# Patient Record
Sex: Female | Born: 1955 | Race: White | Hispanic: No | Smoking: Current every day smoker
Health system: Southern US, Community
[De-identification: ages and names within clinical notes are randomized; demographics above are authoritative.]

## PROBLEM LIST (undated history)

## (undated) DIAGNOSIS — Z87898 Personal history of other specified conditions: Secondary | ICD-10-CM

## (undated) DIAGNOSIS — R9389 Abnormal findings on diagnostic imaging of other specified body structures: Secondary | ICD-10-CM

## (undated) DIAGNOSIS — E119 Type 2 diabetes mellitus without complications: Secondary | ICD-10-CM

## (undated) DIAGNOSIS — I82409 Acute embolism and thrombosis of unspecified deep veins of unspecified lower extremity: Secondary | ICD-10-CM

## (undated) DIAGNOSIS — R7303 Prediabetes: Secondary | ICD-10-CM

## (undated) DIAGNOSIS — K449 Diaphragmatic hernia without obstruction or gangrene: Secondary | ICD-10-CM

## (undated) DIAGNOSIS — G4700 Insomnia, unspecified: Secondary | ICD-10-CM

## (undated) DIAGNOSIS — C801 Malignant (primary) neoplasm, unspecified: Secondary | ICD-10-CM

## (undated) DIAGNOSIS — E041 Nontoxic single thyroid nodule: Secondary | ICD-10-CM

## (undated) DIAGNOSIS — K118 Other diseases of salivary glands: Secondary | ICD-10-CM

## (undated) DIAGNOSIS — F410 Panic disorder [episodic paroxysmal anxiety] without agoraphobia: Secondary | ICD-10-CM

## (undated) DIAGNOSIS — D682 Hereditary deficiency of other clotting factors: Secondary | ICD-10-CM

## (undated) DIAGNOSIS — H269 Unspecified cataract: Secondary | ICD-10-CM

## (undated) DIAGNOSIS — K5792 Diverticulitis of intestine, part unspecified, without perforation or abscess without bleeding: Secondary | ICD-10-CM

## (undated) DIAGNOSIS — Z7901 Long term (current) use of anticoagulants: Secondary | ICD-10-CM

## (undated) DIAGNOSIS — K219 Gastro-esophageal reflux disease without esophagitis: Secondary | ICD-10-CM

## (undated) DIAGNOSIS — K589 Irritable bowel syndrome without diarrhea: Secondary | ICD-10-CM

## (undated) DIAGNOSIS — E0590 Thyrotoxicosis, unspecified without thyrotoxic crisis or storm: Secondary | ICD-10-CM

## (undated) HISTORY — PX: TUBAL LIGATION: SHX77

## (undated) HISTORY — DX: Diverticulitis of intestine, part unspecified, without perforation or abscess without bleeding: K57.92

## (undated) HISTORY — PX: CHOLECYSTECTOMY: SHX55

## (undated) HISTORY — DX: Unspecified cataract: H26.9

## (undated) HISTORY — DX: Long term (current) use of anticoagulants: Z79.01

## (undated) HISTORY — PX: TOE SURGERY: SHX1073

## (undated) HISTORY — DX: Diaphragmatic hernia without obstruction or gangrene: K44.9

## (undated) HISTORY — DX: Gastro-esophageal reflux disease without esophagitis: K21.9

## (undated) HISTORY — DX: Acute embolism and thrombosis of unspecified deep veins of unspecified lower extremity: I82.409

## (undated) HISTORY — DX: Personal history of other specified conditions: Z87.898

## (undated) HISTORY — DX: Irritable bowel syndrome, unspecified: K58.9

## (undated) HISTORY — DX: Insomnia, unspecified: G47.00

## (undated) HISTORY — DX: Panic disorder (episodic paroxysmal anxiety): F41.0

---
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
## 1998-03-29 ENCOUNTER — Emergency Department (HOSPITAL_COMMUNITY): Admission: EM | Admit: 1998-03-29 | Discharge: 1998-03-29 | Payer: Self-pay | Admitting: Emergency Medicine

## 1998-09-17 ENCOUNTER — Encounter: Payer: Self-pay | Admitting: Emergency Medicine

## 1998-09-17 ENCOUNTER — Emergency Department (HOSPITAL_COMMUNITY): Admission: EM | Admit: 1998-09-17 | Discharge: 1998-09-17 | Payer: Self-pay | Admitting: Emergency Medicine

## 2000-05-15 ENCOUNTER — Encounter (INDEPENDENT_AMBULATORY_CARE_PROVIDER_SITE_OTHER): Payer: Self-pay | Admitting: *Deleted

## 2000-05-15 ENCOUNTER — Ambulatory Visit (HOSPITAL_COMMUNITY): Admission: RE | Admit: 2000-05-15 | Discharge: 2000-05-15 | Payer: Self-pay | Admitting: Gastroenterology

## 2002-08-25 ENCOUNTER — Emergency Department (HOSPITAL_COMMUNITY): Admission: EM | Admit: 2002-08-25 | Discharge: 2002-08-25 | Payer: Self-pay | Admitting: Emergency Medicine

## 2002-08-25 ENCOUNTER — Encounter: Payer: Self-pay | Admitting: Emergency Medicine

## 2005-05-26 ENCOUNTER — Emergency Department (HOSPITAL_COMMUNITY): Admission: EM | Admit: 2005-05-26 | Discharge: 2005-05-26 | Payer: Self-pay | Admitting: Emergency Medicine

## 2005-11-02 ENCOUNTER — Emergency Department (HOSPITAL_COMMUNITY): Admission: EM | Admit: 2005-11-02 | Discharge: 2005-11-02 | Payer: Self-pay | Admitting: Emergency Medicine

## 2006-07-29 ENCOUNTER — Emergency Department (HOSPITAL_COMMUNITY): Admission: EM | Admit: 2006-07-29 | Discharge: 2006-07-29 | Payer: Self-pay | Admitting: Emergency Medicine

## 2008-03-16 ENCOUNTER — Emergency Department (HOSPITAL_COMMUNITY): Admission: EM | Admit: 2008-03-16 | Discharge: 2008-03-16 | Payer: Self-pay | Admitting: Emergency Medicine

## 2009-01-31 HISTORY — PX: US ECHOCARDIOGRAPHY: HXRAD669

## 2009-03-26 ENCOUNTER — Ambulatory Visit: Payer: Self-pay | Admitting: Family Medicine

## 2009-03-26 ENCOUNTER — Observation Stay (HOSPITAL_COMMUNITY): Admission: EM | Admit: 2009-03-26 | Discharge: 2009-03-28 | Payer: Self-pay | Admitting: Emergency Medicine

## 2009-03-29 ENCOUNTER — Telehealth: Payer: Self-pay | Admitting: *Deleted

## 2009-07-28 ENCOUNTER — Emergency Department (HOSPITAL_COMMUNITY): Admission: EM | Admit: 2009-07-28 | Discharge: 2009-07-28 | Payer: Self-pay | Admitting: Emergency Medicine

## 2009-07-29 ENCOUNTER — Inpatient Hospital Stay (HOSPITAL_COMMUNITY): Admission: EM | Admit: 2009-07-29 | Discharge: 2009-07-31 | Payer: Self-pay | Admitting: Emergency Medicine

## 2009-07-30 ENCOUNTER — Encounter (INDEPENDENT_AMBULATORY_CARE_PROVIDER_SITE_OTHER): Payer: Self-pay | Admitting: Surgery

## 2010-12-19 LAB — DIFFERENTIAL
Basophils Absolute: 0.1 10*3/uL (ref 0.0–0.1)
Basophils Absolute: 0.1 10*3/uL (ref 0.0–0.1)
Basophils Relative: 1 % (ref 0–1)
Eosinophils Absolute: 0.1 10*3/uL (ref 0.0–0.7)
Eosinophils Absolute: 0.1 10*3/uL (ref 0.0–0.7)
Eosinophils Relative: 0 % (ref 0–5)
Lymphocytes Relative: 13 % (ref 12–46)
Lymphs Abs: 1.8 10*3/uL (ref 0.7–4.0)
Lymphs Abs: 2.7 10*3/uL (ref 0.7–4.0)
Neutrophils Relative %: 77 % (ref 43–77)
Neutrophils Relative %: 84 % — ABNORMAL HIGH (ref 43–77)

## 2010-12-19 LAB — URINE MICROSCOPIC-ADD ON

## 2010-12-19 LAB — CBC
HCT: 43 % (ref 36.0–46.0)
HCT: 44.8 % (ref 36.0–46.0)
Hemoglobin: 12.7 g/dL (ref 12.0–15.0)
Hemoglobin: 14.7 g/dL (ref 12.0–15.0)
MCHC: 34.1 g/dL (ref 30.0–36.0)
MCHC: 34.3 g/dL (ref 30.0–36.0)
MCV: 88.3 fL (ref 78.0–100.0)
MCV: 89.2 fL (ref 78.0–100.0)
Platelets: 172 10*3/uL (ref 150–400)
Platelets: 205 10*3/uL (ref 150–400)
RBC: 4.87 MIL/uL (ref 3.87–5.11)
RDW: 14.5 % (ref 11.5–15.5)
RDW: 15 % (ref 11.5–15.5)
WBC: 17.5 10*3/uL — ABNORMAL HIGH (ref 4.0–10.5)

## 2010-12-19 LAB — HEPATIC FUNCTION PANEL
Albumin: 3.6 g/dL (ref 3.5–5.2)
Alkaline Phosphatase: 82 U/L (ref 39–117)
Indirect Bilirubin: 0.8 mg/dL (ref 0.3–0.9)
Total Protein: 7.5 g/dL (ref 6.0–8.3)

## 2010-12-19 LAB — URINALYSIS, ROUTINE W REFLEX MICROSCOPIC
Bilirubin Urine: NEGATIVE
Nitrite: NEGATIVE
Specific Gravity, Urine: 1.014 (ref 1.005–1.030)
Urobilinogen, UA: 1 mg/dL (ref 0.0–1.0)
pH: 6 (ref 5.0–8.0)

## 2010-12-19 LAB — COMPREHENSIVE METABOLIC PANEL
CO2: 23 mEq/L (ref 19–32)
Calcium: 9.1 mg/dL (ref 8.4–10.5)
Chloride: 102 mEq/L (ref 96–112)
Creatinine, Ser: 0.79 mg/dL (ref 0.4–1.2)
GFR calc non Af Amer: >60 mL/min (ref >60)
Glucose, Bld: 136 mg/dL — ABNORMAL HIGH (ref 70–99)
Total Bilirubin: 0.5 mg/dL (ref 0.3–1.2)

## 2010-12-19 LAB — BASIC METABOLIC PANEL
BUN: 9 mg/dL (ref 6–23)
Chloride: 96 mEq/L (ref 96–112)
Creatinine, Ser: 0.68 mg/dL (ref 0.4–1.2)
Glucose, Bld: 102 mg/dL — ABNORMAL HIGH (ref 70–99)
Potassium: 3.8 mEq/L (ref 3.5–5.1)

## 2010-12-19 LAB — POCT CARDIAC MARKERS
CKMB, poc: <1 ng/mL — ABNORMAL LOW (ref 1.0–8.0)
Troponin i, poc: <0.05 ng/mL (ref 0.00–0.09)

## 2010-12-19 LAB — LIPASE, BLOOD
Lipase: 12 U/L (ref 11–59)
Lipase: 15 U/L (ref 11–59)

## 2010-12-23 LAB — DIFFERENTIAL
Basophils Absolute: 0 10*3/uL (ref 0.0–0.1)
Basophils Relative: 0 % (ref 0–1)
Lymphocytes Relative: 17 % (ref 12–46)
Neutro Abs: 7 10*3/uL (ref 1.7–7.7)
Neutrophils Relative %: 74 % (ref 43–77)

## 2010-12-23 LAB — COMPREHENSIVE METABOLIC PANEL
ALT: 14 U/L (ref 0–35)
Albumin: 2.9 g/dL — ABNORMAL LOW (ref 3.5–5.2)
Alkaline Phosphatase: 57 U/L (ref 39–117)
Alkaline Phosphatase: 63 U/L (ref 39–117)
BUN: 4 mg/dL — ABNORMAL LOW (ref 6–23)
Chloride: 104 mEq/L (ref 96–112)
Chloride: 99 mEq/L (ref 96–112)
Creatinine, Ser: 0.68 mg/dL (ref 0.4–1.2)
GFR calc non Af Amer: >60 mL/min (ref >60)
Glucose, Bld: 104 mg/dL — ABNORMAL HIGH (ref 70–99)
Potassium: 3.3 mEq/L — ABNORMAL LOW (ref 3.5–5.1)
Potassium: 3.9 mEq/L (ref 3.5–5.1)
Sodium: 133 mEq/L — ABNORMAL LOW (ref 135–145)
Total Bilirubin: 0.6 mg/dL (ref 0.3–1.2)
Total Protein: 6.1 g/dL (ref 6.0–8.3)

## 2010-12-23 LAB — CBC
HCT: 35.5 % — ABNORMAL LOW (ref 36.0–46.0)
Hemoglobin: 12.6 g/dL (ref 12.0–15.0)
MCHC: 34.7 g/dL (ref 30.0–36.0)
MCV: 88.9 fL (ref 78.0–100.0)
Platelets: 128 10*3/uL — ABNORMAL LOW (ref 150–400)
Platelets: 133 10*3/uL — ABNORMAL LOW (ref 150–400)
RDW: 13.9 % (ref 11.5–15.5)
RDW: 14.9 % (ref 11.5–15.5)
WBC: 10.5 10*3/uL (ref 4.0–10.5)

## 2010-12-23 LAB — POCT I-STAT, CHEM 8
Calcium, Ion: 1.05 mmol/L — ABNORMAL LOW (ref 1.12–1.32)
Glucose, Bld: 164 mg/dL — ABNORMAL HIGH (ref 70–99)
HCT: 40 % (ref 36.0–46.0)
Hemoglobin: 13.6 g/dL (ref 12.0–15.0)
Potassium: 3.5 mEq/L (ref 3.5–5.1)

## 2010-12-23 LAB — HEMOGLOBIN A1C
Hgb A1c MFr Bld: 6.3 % — ABNORMAL HIGH (ref 4.6–6.1)
Mean Plasma Glucose: 134 mg/dL

## 2011-01-29 NOTE — Discharge Summary (Signed)
Rhonda Hudson, Rhonda Hudson NO.:  [id number]   MEDICAL RECORD NO.:  [id number]          PATIENT TYPE:  OBV   LOCATION:  5124                         FACILITY:  [facility]   PHYSICIAN:  Leighton Roach McDiarmid, M.D.DATE OF BIRTH:  [birth year]   DATE OF ADMISSION:  03/26/2009  DATE OF DISCHARGE:  03/28/2009                               DISCHARGE SUMMARY   PRIMARY CARE Laiyah Exline:  Alvia Grove, DO, at [facility].   DISCHARGE DIAGNOSES:  1. Questionable [facility] spotted fever (No rash)  2. Possible pneumonia based on Left Lower Lobe airspace consolidation      seen on Chest CT.  3. Gastroesophageal reflux disease.   DISCHARGE MEDICATIONS:  1. Doxycycline 100 mg by mouth twice daily for 5 more days, this is a      new prescription.  2. Nicotine patch 21 mg for 6 weeks, this is a new prescription.  3. Prilosec over-the-counter.   PROCEDURE:  Chest x-ray done on March 27, 2009, which showed a left lower  lobe mass versus focal infiltrate.  CT of the chest was recommended.  CT  of the chest was done on March 27, 2009, which showed a:  1. Left lower lobe airspace consolidation with surrounding      inflammatory ground glass consistent with pneumonia.  2. Scattered upper lobe airspace nodules, questionably infectious.  ER      follow up was recommended.  3. Left hilar lymph node maybe reactive.   LABORATORY DATA:  WBC 9.5, hemoglobin 12.3, and platelets 128.  Sodium  136, potassium 3.9, BUN 4, and creatinine 0.68.  Hemoglobin A1c is  pending as well as [facility] spotted fever is pending.   BRIEF HOSPITAL COURSE:  1. This is a 55 year old female with past medical history of GERD who      presented with a 3-day history of fever, flu-like symptoms, and      positive tick exposure.  The patient first noticed a small tick      embedded on her left shoulder on March 23, 2009, tick was removed by      her son.  By the next day, the patient began to  experience flu-like      symptoms with severe generalized malaise, malacia, and protracted      nausea, and vomiting.  She also had a frontal headache.  Fevers      were documented up to 103.3.  The patient presented to the ER and      was subsequently admitted to the floor.  She was started on IV      ibuprofen and doxycycline 100 mg p.o. b.i.d.  The patient reported      no sick contacts prior to her illness.  During her hospital course,      she was continued on doxycycline.  Chest x-ray as well as followup      CT of the chest was done.  The patient is feeling much better      today, fever has resolved, and she will be discharged on      doxycycline  100 mg b.i.d. x5 days.  Followup with her PCP.  2. Questionable pneumonia as seen on the CT scan.  Treatment as is      above.  We will continue to treat with doxycycline for pneumonia as      above. Recommend CT follow up within 3 months.  The patient has      been informed of this.  3. GERD.  The patient was continued during her hospital stay on      Protonix 40 mg daily.   DISCHARGE INSTRUCTIONS:  The patient was instructed if her fever  increases or if she gets any shortness of breath.  She feels sick or  weak or if headache returns, she is to return to the ER.   PENDING RESULTS:  Issues to be followed after discharge include her  hemoglobin A1c as well as [facility] spotted fever.  She also needs  to follow up in 2 months with a repeat CT scan with contrast of her  chest.   FOLLOWUP APPOINTMENTS:  An appointment with Dr. Alvia Grove at [facility] on April 07, 2009, at 3:30 p.m.   DISCHARGE CONDITION:  The patient was discharged to home in stable  medical condition.      Alvia Grove, DO  Electronically Signed      Leighton Roach McDiarmid, M.D.  Electronically Signed    BB/MEDQ  D:  03/28/2009  T:  03/29/2009  Job:  [id number]

## 2011-01-29 NOTE — H&P (Signed)
Rhonda Hudson, PIETSCH              ACCOUNT NO.:  [id number]   MEDICAL RECORD NO.:  [id number]          PATIENT TYPE:  OBV   LOCATION:  5124                         FACILITY:  [facility]   PHYSICIAN:  Leighton Roach McDiarmid, M.D.DATE OF BIRTH:  02/15/1956   DATE OF ADMISSION:  03/26/2009  DATE OF DISCHARGE:                              HISTORY & PHYSICAL   PRIMARY CARE PHYSICIAN:  Unassigned.   CHIEF COMPLAINT:  Fever.   HISTORY OF PRESENT ILLNESS:  This is a 55 year old female with a 3-day  history of fever, flu-like symptoms, and positive tick exposure.  The  patient first noticed a very small tick imbedded on her left shoulder on  March 23, 2009.  The tick was removed carefully by her and her son, per  the patient.  The next day, the patient began to experience flu-like  symptoms with severe generalized malaise, myalgia, as well as having  protracted nausea and vomiting.  The patient then began to experience a  severe frontal headache associated with the flu-like symptoms.  Of note,  the patient does have a prior history of migraines; however, the patient  does report that the headache she has had over the past few days is not  similar to her typical migraine symptomatology.  The patient states that  approximately a day ago she began to experience subjective fevers that  were persistent for greater than 24 hours.  The patient then went to  urgent care where the patient's temperature was at 102.9 and then 103.3  after receiving Tylenol.  The patient was then subsequently sent to the  [facility] Emergency Department on ibuprofen as well as  doxycycline for further evaluation at [facility].  The patient  does state that the ibuprofen and doxycycline did help make her feel a  little bit better, per the patient.  The patient does not report any  sick contacts with similar symptomatology, no diarrhea during the acute  phase of her sickness over the past 3 days.  In particular, no  rashes  around the area of the tick insertion on the left shoulder.  She has had  decreased appetite as well as leg cramps bilaterally.   PAST MEDICAL HISTORY:  1. Hiatal hernia.  2. Gastroesophageal reflux disease.  3. Migraine headaches.   PAST SURGICAL HISTORY:  Bilateral tubal ligation.   MEDICATIONS:  Prilosec.   ALLERGIES:  NO KNOWN DRUG ALLERGIES.   SOCIAL HISTORY:  The patient works out 2 hours a day and has lost 40  pounds over the last 4 months.  She does smoke 1 pack per day.  She has  smoked 1 pack per day for 30 years.  She denies alcohol or any other  illegal drug use.  She has 3 sons with whom she lives with too.   FAMILY HISTORY:  She has a 51 year old son with epilepsy and a 30-year-  old son with Tourette.   PHYSICAL EXAMINATION:  VITALS:  Temperature 103.3, pulse 80, respiratory  rate 18, blood pressure 97/59, pulse ox satting greater than 100% on  room air.  GENERAL:  Patient is alert and oriented x3 in no acute distress.  HEENT:  Tacky mucous membranes.  NECK:  Full range of motion with meningeal signs such as nuchal rigidity  noted.  Neck shows no cervical lymphadenopathy.  MOUTH:  Oropharynx is pink and moist.  CARDIOVASCULAR:  Regular rate and rhythm.  No rubs, gallops, or murmurs.  RESPIRATORY:  Clear to auscultation bilaterally without wheezes, rales,  or rhonchi.  ABDOMEN:  Soft, nontender, nondistended.  Positive bowel sounds.  EXTREMITIES:  Left shoulder with visible scab where tick was embedded.  No joint tenderness or effusion noted diffusely.  SKIN:  No rashes, brisk capillary refill.   LABORATORY DATA:  White count 10.5, hemoglobin 12.6, hematocrit 36.9,  platelets 133.  Sodium 133, potassium 3.3, chloride 99, bicarb 24, BUN  7.0, creatinine 0.87, blood sugar 140.   ASSESSMENT AND PLAN:  This is a 55 year old female with fever and flu-  like symptoms as well as positive tick exposure concerning for [facility] spotted fever.  1.  Fever.  Given the patient's clinical presentation and history, the      patient has a very good story for [facility] spotted fever.      The patient has positive flu-like symptoms in the middle of the      summer without any other identifiable cause for the fever, no      positive sick contacts, but certainly positive exposure and      imbedding of the tick prior to the onset of symptoms.  We will      treat with doxycycline 100 mg p.o. b.i.d.  We will also use Tylenol      and  ibuprofen p.r.n. for fever as well as Phenergan p.r.n. for      nausea and vomiting.  We will also obtain a [facility] spotted      fever IFA antibody to look at any possible acute response to [facility] Fever.  2. Headache.  At this point, there are not any signs concerning for      meningitis or encephalitis.  The patient is mentating well.  There      is no nuchal rigidity or headache presenting as worse headache of      the patient's life per se.  We will continue to monitor and treat      with Tylenol and Motrin.  3. Gastroesophageal reflux disease.  The patient is on Protonix 40 mg      p.o. daily.  4. FEN/GI:  The patient is dehydrated given the protracted nausea over      the weekend.  We will give her 1 liter of normal saline bolus and      then D5 half normal at 100 mL per hour.  5. Prophylaxis.  The patient is on Protonix as well as early      ambulation.  6. Discharge pending clinical improvement.      Doree Albee, MD  Electronically Signed      Leighton Roach McDiarmid, M.D.  Electronically Signed    SN/MEDQ  D:  03/27/2009  T:  03/27/2009  Job:  [id number]

## 2011-02-01 NOTE — Procedures (Signed)
[name]. [facility]  Patient:    Rhonda Hudson, [name]                     MRN: [id number] Proc. Date: 05/15/00 Adm. Date:  [id number] Attending:  Rich Brave CC:         Stacie Acres. Cliffton Asters, M.D.   Procedure Report  PROCEDURE:  Colonoscopy with biopsies.  INDICATIONS:  Diarrhea and intermittent rectal bleeding in a 55 year old female with chronic abdominal pain.  FINDINGS:  Normal examination except for moderate sigmoid diverticulosis.  PROCEDURE:  The nature, purpose, and risks of the procedure had been discussed with the patient, who provided written consent.  This procedure was performed immediately following her upper endoscopy.  Total sedation for the two procedures was fentanyl 150 mcg and Versed 10 mg without arrhythmias or desaturation.  The Olympus adult video colonoscope was advanced without too much difficulty to the cecum, applying external abdominal compression to facilitate passage.  Pullback was then performed.  The cecum was identified by visualization of the appendiceal orifice.  The terminal ileum was not entered.  The quality of the prep was excellent and it is felt that all areas were well seen.  The only abnormality on this examination was moderate sigmoid diverticulosis. I did not see diverticula elsewhere.  No polyps, cancer, colitis or vascular malformations were observed and retroflexion of the rectum was unremarkable. Random mucosal biopsies were obtained from the left colon and rectum.  The patient tolerated the procedure well and there were no apparent complications.  IMPRESSION:  Sigmoid diverticulosis, otherwise normal examination.   No source of diarrhea identified.  Her rectal bleeding is presumed to be of hemorrhoidal origin or perhaps anal fissures, given the absent of alternative explanations seen on this examination.  PLAN:  Await pathology on biopsies.  Office follow-up in the near future. DD:   05/15/00 TD:  05/16/00 Job: [id number]

## 2011-02-01 NOTE — Procedures (Signed)
[place]  Patient:    Rhonda Hudson, Rhonda Hudson                     MRN: [id number] Proc. Date: 05/15/00 Adm. Date:  [id number] Attending:  Rich Brave CC:         Stacie Acres. Cliffton Asters, M.D.   Procedure Report  PROCEDURE:  Upper endoscopy with biopsies.  INDICATIONS:  A 55 year old female with recurrent vomiting episodes of unclear cause, also anorexia and epigastric pain.  FINDINGS:  Normal upper endoscopy.  PROCEDURE:  The nature, purpose, and risks of the procedure have been discussed with the patient, who provided written consent.  Sedation was fentanyl 50 mcg and Versed 5 mg IV without arrhythmias or desaturation.  The Olympus video endoscope was passed under direct vision.  The vocal cords looked normal.  The esophagus was easily entered and had normal mucosa, without evidence of reflux, esophagitis, Barretts esophagus, varices, infection or neoplasia.  There was perhaps a 1 cm minimal hiatal hernia present, but no ring or stricture was appreciated.  The stomach contained no significant residual, just a small clear residual, which was suctioned up, and no gastritis, erosions, ulcers, polyps or masses were noted, including a retroflexed view of the proximal stomach.  The pylorus, duodenal bulb and second duodenum were similarly normal in appearance.  Duodenal and antral biopsies were obtained prior to removal of the scope.  The patient tolerated the procedure well and there were no apparent complications.  IMPRESSION:  Normal upper endoscopy, without source of abdominal pain, nausea, vomiting or anorexia evident.  PLAN:  Await pathology on biopsies and to proceed to colonoscopic evaluation. DD:  05/15/00 TD:  05/16/00 Job: [id number]

## 2011-06-13 LAB — DIFFERENTIAL
Eosinophils Absolute: 0.1
Lymphocytes Relative: 24
Lymphs Abs: 2.8
Neutro Abs: 7.8 — ABNORMAL HIGH
Neutrophils Relative %: 68

## 2011-06-13 LAB — POCT I-STAT, CHEM 8
BUN: 9
Potassium: 3.5
Sodium: 140
TCO2: 25

## 2011-06-13 LAB — POCT CARDIAC MARKERS
Myoglobin, poc: 49.2
Operator id: 272551

## 2011-06-13 LAB — CBC
MCV: 88.3
Platelets: 230
WBC: 11.5 — ABNORMAL HIGH

## 2011-06-13 LAB — D-DIMER, QUANTITATIVE: D-Dimer, Quant: 0.92 — ABNORMAL HIGH

## 2011-12-20 ENCOUNTER — Encounter: Payer: Self-pay | Admitting: *Deleted

## 2012-11-24 ENCOUNTER — Emergency Department (HOSPITAL_COMMUNITY)
Admission: EM | Admit: 2012-11-24 | Discharge: 2012-11-24 | Disposition: A | Discharge status: Home / Self Care | Payer: Self-pay | Attending: Emergency Medicine | Admitting: Emergency Medicine

## 2012-11-24 ENCOUNTER — Encounter (HOSPITAL_COMMUNITY): Payer: Self-pay | Admitting: Nurse Practitioner

## 2012-11-24 ENCOUNTER — Emergency Department (HOSPITAL_COMMUNITY): Payer: Self-pay

## 2012-11-24 DIAGNOSIS — Z3202 Encounter for pregnancy test, result negative: Secondary | ICD-10-CM | POA: Insufficient documentation

## 2012-11-24 DIAGNOSIS — R630 Anorexia: Secondary | ICD-10-CM | POA: Insufficient documentation

## 2012-11-24 DIAGNOSIS — Z8659 Personal history of other mental and behavioral disorders: Secondary | ICD-10-CM | POA: Insufficient documentation

## 2012-11-24 DIAGNOSIS — R110 Nausea: Secondary | ICD-10-CM | POA: Insufficient documentation

## 2012-11-24 DIAGNOSIS — Z8719 Personal history of other diseases of the digestive system: Secondary | ICD-10-CM | POA: Insufficient documentation

## 2012-11-24 DIAGNOSIS — Z9851 Tubal ligation status: Secondary | ICD-10-CM | POA: Insufficient documentation

## 2012-11-24 DIAGNOSIS — Z8669 Personal history of other diseases of the nervous system and sense organs: Secondary | ICD-10-CM | POA: Insufficient documentation

## 2012-11-24 DIAGNOSIS — K219 Gastro-esophageal reflux disease without esophagitis: Secondary | ICD-10-CM | POA: Insufficient documentation

## 2012-11-24 DIAGNOSIS — F17200 Nicotine dependence, unspecified, uncomplicated: Secondary | ICD-10-CM | POA: Insufficient documentation

## 2012-11-24 DIAGNOSIS — R141 Gas pain: Secondary | ICD-10-CM | POA: Insufficient documentation

## 2012-11-24 DIAGNOSIS — A599 Trichomoniasis, unspecified: Secondary | ICD-10-CM | POA: Insufficient documentation

## 2012-11-24 DIAGNOSIS — R142 Eructation: Secondary | ICD-10-CM | POA: Insufficient documentation

## 2012-11-24 LAB — POCT I-STAT TROPONIN I: Troponin i, poc: 0 ng/mL (ref 0.00–0.08)

## 2012-11-24 LAB — LIPASE, BLOOD: Lipase: 18 U/L (ref 11–59)

## 2012-11-24 LAB — URINALYSIS, MICROSCOPIC ONLY
Glucose, UA: NEGATIVE mg/dL
Hgb urine dipstick: NEGATIVE
Ketones, ur: NEGATIVE mg/dL
Protein, ur: NEGATIVE mg/dL

## 2012-11-24 LAB — CBC WITH DIFFERENTIAL/PLATELET
HCT: 42.8 % (ref 36.0–46.0)
Hemoglobin: 15.3 g/dL — ABNORMAL HIGH (ref 12.0–15.0)
Lymphocytes Relative: 25 % (ref 12–46)
Lymphs Abs: 2.5 10*3/uL (ref 0.7–4.0)
Monocytes Absolute: 0.7 10*3/uL (ref 0.1–1.0)
Monocytes Relative: 7 % (ref 3–12)
Neutro Abs: 6.7 10*3/uL (ref 1.7–7.7)
WBC: 9.9 10*3/uL (ref 4.0–10.5)

## 2012-11-24 LAB — COMPREHENSIVE METABOLIC PANEL
AST: 16 U/L (ref 0–37)
BUN: 6 mg/dL (ref 6–23)
CO2: 20 mEq/L (ref 19–32)
Chloride: 100 mEq/L (ref 96–112)
Creatinine, Ser: 0.71 mg/dL (ref 0.50–1.10)
GFR calc non Af Amer: >90 mL/min (ref >90)
Total Bilirubin: 0.4 mg/dL (ref 0.3–1.2)

## 2012-11-24 MED ORDER — METRONIDAZOLE 500 MG PO TABS: 500.0000 mg | ORAL_TABLET | Freq: Two times a day (BID) | ORAL | Status: DC

## 2012-11-24 MED ORDER — HYDROCODONE-ACETAMINOPHEN 5-325 MG PO TABS: 1.0000 | ORAL_TABLET | Freq: Four times a day (QID) | ORAL | Status: DC | PRN

## 2012-11-24 NOTE — ED Provider Notes (Signed)
History     CSN: [id number]  Arrival date & time 11/24/12  1303   First MD Initiated Contact with Patient 11/24/12 1708      Chief Complaint  Patient presents with  . Abdominal Pain    Patient is a 57 y.o. female presenting with abdominal pain. The history is provided by the patient. No language interpreter was used.  Abdominal Pain Pain location:  Epigastric Pain quality: sharp   Pain radiates to:  Does not radiate Pain severity:  No pain Onset quality:  Gradual Duration:  12 hours Timing:  Constant Progression:  Waxing and waning Chronicity:  New Context: previous surgery (cholecystectomy)   Context: not suspicious food intake and not trauma   Relieved by:  Nothing Worsened by:  Position changes Ineffective treatments:  None tried Associated symptoms: anorexia, belching and nausea   Associated symptoms: no chest pain, no chills, no constipation, no cough, no diarrhea, no dysuria, no fever, no shortness of breath and no vomiting      Past Medical History  Diagnosis Date  . Insomnia   . Multiple sclerosis   . Hiatal hernia with gastroesophageal reflux     disease  . Diverticulitis   . Irritable bowel syndrome   . Panic attacks     Hx of    Past Surgical History  Procedure Laterality Date  . Tubal ligation    . US echocardiography  01-31-2009    EF 60%    History reviewed. No pertinent family history.  History  Substance Use Topics  . Smoking status: Current Every Day Smoker -- 1.00 packs/day  . Smokeless tobacco: Not on file  . Alcohol Use: No    OB History   Grav Para Term Preterm Abortions TAB SAB Ect Mult Living                  Review of Systems  Constitutional: Negative for fever and chills.  HENT: Negative for congestion, rhinorrhea, neck pain and neck stiffness.   Respiratory: Negative for cough and shortness of breath.   Cardiovascular: Negative for chest pain.  Gastrointestinal: Positive for nausea, abdominal pain and anorexia. Negative  for vomiting, diarrhea, constipation and abdominal distention.  Endocrine: Negative for polyuria.  Genitourinary: Negative for dysuria.  Skin: Negative for rash.  Neurological: Negative for headaches.  Psychiatric/Behavioral: Negative.   All other systems reviewed and are negative.    Allergies  Review of patient's allergies indicates no known allergies.  Home Medications   Current Outpatient Rx  Name  Route  Sig  Dispense  Refill  . ibuprofen (ADVIL,MOTRIN) 200 MG tablet   Oral   Take 200 mg by mouth 4 (four) times daily as needed for pain. For pain           BP 130/67  Pulse 96  Temp(Src) 98.3 F (36.8 C) (Oral)  Resp 15  SpO2 95%  Physical Exam  Nursing note and vitals reviewed. Constitutional: She is oriented to person, place, and time. She appears well-developed and well-nourished. No distress.  HENT:  Head: Normocephalic and atraumatic.  Right Ear: External ear normal.  Left Ear: External ear normal.  Nose: Nose normal.  Mouth/Throat: Oropharynx is clear and moist. No oropharyngeal exudate.  Eyes: EOM are normal. Pupils are equal, round, and reactive to light.  Neck: Normal range of motion. Neck supple. No tracheal deviation present.  Cardiovascular: Normal rate.   Pulmonary/Chest: Effort normal and breath sounds normal. No stridor. No respiratory distress. She has no wheezes.  She has no rales.  Abdominal: Soft. She exhibits no distension. There is no tenderness. There is no rebound.  Musculoskeletal: Normal range of motion.  Neurological: She is alert and oriented to person, place, and time.  Skin: Skin is warm and dry. She is not diaphoretic.    ED Course  Procedures (including critical care time)  Labs Reviewed  CBC WITH DIFFERENTIAL - Abnormal; Notable for the following:    Hemoglobin 15.3 (*)    All other components within normal limits  COMPREHENSIVE METABOLIC PANEL - Abnormal; Notable for the following:    Sodium 133 (*)    Glucose, Bld 120 (*)     All other components within normal limits  LIPASE, BLOOD  URINALYSIS, MICROSCOPIC ONLY  POCT I-STAT TROPONIN I  POCT PREGNANCY, URINE   Dg Abd Acute W/chest  11/24/2012  *RADIOLOGY REPORT*  Clinical Data: Abdominal pain  ACUTE ABDOMEN SERIES (ABDOMEN 2 VIEW & CHEST 1 VIEW)  Comparison: Chest radiograph 03/26/2009  Findings: Minimal scarring or atelectasis noted at the costophrenic angles bilaterally.  Lungs are otherwise clear.  Heart size normal. No pleural effusion.  No free air beneath the diaphragms.  Cholecystectomy clips noted.  Bowel gas pattern is normal.  No abnormal calcific opacity.  No acute osseous finding.  IMPRESSION: Minimal bibasilar atelectasis or scarring.  Normal bowel gas pattern.   Original Report Authenticated By: Christiana Pellant, M.D.      1. Trichomoniasis   2. GERD (gastroesophageal reflux disease)       MDM   57 year old female who presents to the emergency department for concern of epigastric abdominal pain that started early this morning. It awoke her from sleep. Has been moderate in severity waxing and waning since then. Different than irritable bowel syndrome.  Labs unremarkable with exception of trichomonas in urine.  Patient reassesed and plan to do pelvic examination to evaluate for possible PID or pelvic cause of pain.  Patient politely declined and understood risks.  Acute abdominal series without any free air or air/fluid levels.  Patient reevaluated and feeling better.  Patient safe for discharge.  Doubt appendicitis.  Likely GERD.  Will treat pain and trichomoniasis with flagyl.  Patient safe for discharge.  Patient to return at any time if she changes her mind about pelvic.  Patient to establish PCP and GYN for further evaluation.  Patient understanding plan.  Patient discharged.       Arloa Koh, MD 11/24/12 2207

## 2012-11-24 NOTE — ED Notes (Signed)
Pt c/o severe upper abd pain for past 2 days. History of ulcer, acid reflux, diverticulosis. Pt reports she is supposed to take thyroid medicine but has not been taking it as instructed. Pt reports severe nausea.

## 2012-11-24 NOTE — ED Notes (Signed)
Per Dr. Piedad Climes, pt refused pelvic exam.

## 2012-11-25 LAB — URINE CULTURE: Colony Count: >=100000

## 2012-11-25 NOTE — ED Provider Notes (Signed)
I  reviewed the resident's note and I agree with the findings and plan.     Nelia Shi, MD 11/25/12 1113

## 2013-08-17 ENCOUNTER — Encounter: Payer: Self-pay | Admitting: Cardiology

## 2015-02-06 ENCOUNTER — Other Ambulatory Visit: Payer: Self-pay | Admitting: Dermatology

## 2015-03-03 ENCOUNTER — Other Ambulatory Visit (HOSPITAL_COMMUNITY)
Admission: RE | Admit: 2015-03-03 | Discharge: 2015-03-03 | Disposition: A | Discharge status: Home / Self Care | Payer: BLUE CROSS/BLUE SHIELD | Source: Ambulatory Visit | Attending: Obstetrics & Gynecology | Admitting: Obstetrics & Gynecology

## 2015-03-03 ENCOUNTER — Other Ambulatory Visit: Payer: Self-pay | Admitting: Obstetrics & Gynecology

## 2015-03-03 DIAGNOSIS — Z1151 Encounter for screening for human papillomavirus (HPV): Secondary | ICD-10-CM | POA: Insufficient documentation

## 2015-03-03 DIAGNOSIS — Z01419 Encounter for gynecological examination (general) (routine) without abnormal findings: Secondary | ICD-10-CM | POA: Diagnosis present

## 2015-03-06 LAB — CYTOLOGY - PAP

## 2015-07-14 ENCOUNTER — Ambulatory Visit (INDEPENDENT_AMBULATORY_CARE_PROVIDER_SITE_OTHER): Payer: BLUE CROSS/BLUE SHIELD | Admitting: Emergency Medicine

## 2015-07-14 VITALS — BP 128/80 | HR 73 | Temp 97.8°F | Resp 16 | Ht 68.0 in | Wt 211.0 lb

## 2015-07-14 DIAGNOSIS — M545 Low back pain: Secondary | ICD-10-CM

## 2015-07-14 LAB — POCT URINALYSIS DIP (MANUAL ENTRY)
BILIRUBIN UA: NEGATIVE
Glucose, UA: NEGATIVE
Ketones, POC UA: NEGATIVE
Leukocytes, UA: NEGATIVE
NITRITE UA: NEGATIVE
PH UA: 5
Protein Ur, POC: NEGATIVE
RBC UA: NEGATIVE
Spec Grav, UA: 1.015
Urobilinogen, UA: 0.2

## 2015-07-14 LAB — POC MICROSCOPIC URINALYSIS (UMFC)

## 2015-07-14 MED ORDER — CYCLOBENZAPRINE HCL 10 MG PO TABS: 10.0000 mg | ORAL_TABLET | Freq: Three times a day (TID) | ORAL | Status: DC | PRN

## 2015-07-14 MED ORDER — NAPROXEN SODIUM 550 MG PO TABS: 550.0000 mg | ORAL_TABLET | Freq: Two times a day (BID) | ORAL | Status: DC

## 2015-07-14 NOTE — Patient Instructions (Signed)

## 2015-07-14 NOTE — Progress Notes (Signed)
Subjective:  Patient ID: Rhonda Hudson, female    DOB: 05/23/1956  Age: 59 y.o. MRN: [id number]  CC: Back Pain   HPI Rhonda Hudson presents  with low back pain. She's concerned because she has a history of a cyst she has no dysuria urgency or frequency. She holds her urine for 8 hours at work until she returned home again B should like using public facilities. She has no fever or chills. No nausea or vomiting. No stool change. No vaginal discharge or bleeding. She has no history of injury or overuse. He has no radiation of pain. Pain is located in the right lower back  History Rhonda Hudson has a past medical history of Insomnia; Multiple sclerosis ([place]); Hiatal hernia with gastroesophageal reflux; Diverticulitis; Irritable bowel syndrome; and Panic attacks.   She has past surgical history that includes Tubal ligation; US ECHOCARDIOGRAPHY (01-31-2009); and Cholecystectomy.   Her  family history is not on file.  She   reports that she has been smoking.  She does not have any smokeless tobacco history on file. She reports that she does not drink alcohol or use illicit drugs.  Outpatient Prescriptions Prior to Visit  Medication Sig Dispense Refill  . HYDROcodone-acetaminophen (NORCO) 5-325 MG per tablet Take 1-2 tablets by mouth every 6 (six) hours as needed for pain. (Patient not taking: Reported on 07/14/2015) 15 tablet 0  . metroNIDAZOLE (FLAGYL) 500 MG tablet Take 1 tablet (500 mg total) by mouth 2 (two) times daily. (Patient not taking: Reported on 07/14/2015) 14 tablet 0  . ibuprofen (ADVIL,MOTRIN) 200 MG tablet Take 200 mg by mouth 4 (four) times daily as needed for pain. For pain     No facility-administered medications prior to visit.    Social History   Social History  . Marital Status: Divorced    Spouse Name: N/A  . Number of Children: N/A  . Years of Education: N/A   Social History Main Topics  . Smoking status: Current Every Day Smoker -- 1.00 packs/day  .  Smokeless tobacco: None  . Alcohol Use: No  . Drug Use: No  . Sexual Activity: Not Asked   Other Topics Concern  . None   Social History Narrative     Review of Systems  Constitutional: Negative for fever, chills and appetite change.  HENT: Negative for congestion, ear pain, postnasal drip, sinus pressure and sore throat.   Eyes: Negative for pain and redness.  Respiratory: Negative for cough, shortness of breath and wheezing.   Cardiovascular: Negative for leg swelling.  Gastrointestinal: Negative for nausea, vomiting, abdominal pain, diarrhea, constipation and blood in stool.  Endocrine: Negative for polyuria.  Genitourinary: Negative for dysuria, urgency, frequency and flank pain.  Musculoskeletal: Positive for back pain. Negative for gait problem.  Skin: Negative for rash.  Neurological: Negative for weakness and headaches.  Psychiatric/Behavioral: Negative for confusion and decreased concentration. The patient is not nervous/anxious.     Objective:  BP 128/80 mmHg  Pulse 73  Temp(Src) 97.8 F (36.6 C) (Oral)  Resp 16  Ht 5\' 8"  (1.727 m)  Wt 211 lb (95.709 kg)  BMI 32.09 kg/m2  SpO2 98%  Physical Exam  Constitutional: She is oriented to person, place, and time. She appears well-developed and well-nourished. No distress.  HENT:  Head: Normocephalic and atraumatic.  Right Ear: External ear normal.  Left Ear: External ear normal.  Nose: Nose normal.  Eyes: Conjunctivae and EOM are normal. Pupils are equal, round, and reactive to light.  No scleral icterus.  Neck: Normal range of motion. Neck supple. No tracheal deviation present.  Cardiovascular: Normal rate, regular rhythm and normal heart sounds.   Pulmonary/Chest: Effort normal. No respiratory distress. She has no wheezes. She has no rales.  Abdominal: She exhibits no mass. There is no tenderness. There is no rebound and no guarding.  Musculoskeletal: She exhibits no edema.  Lymphadenopathy:    She has no  cervical adenopathy.  Neurological: She is alert and oriented to person, place, and time. Coordination normal.  Skin: Skin is warm and dry. No rash noted.  Psychiatric: She has a normal mood and affect. Her behavior is normal.   She is point tender in the right lumbar region.   Assessment & Plan:   Rhonda Hudson was seen today for back pain.  Diagnoses and all orders for this visit:  Low back pain without sciatica, unspecified back pain laterality -     POCT urinalysis dipstick -     POCT Microscopic Urinalysis ([facility])  Other orders -     naproxen sodium (ANAPROX DS) 550 MG tablet; Take 1 tablet (550 mg total) by mouth 2 (two) times daily with a meal. -     cyclobenzaprine (FLEXERIL) 10 MG tablet; Take 1 tablet (10 mg total) by mouth 3 (three) times daily as needed for muscle spasms.  I have discontinued Ms. Rada's ibuprofen. I am also having her start on naproxen sodium and cyclobenzaprine. Additionally, I am having her maintain her metroNIDAZOLE and HYDROcodone-acetaminophen.  Meds ordered this encounter  Medications  . naproxen sodium (ANAPROX DS) 550 MG tablet    Sig: Take 1 tablet (550 mg total) by mouth 2 (two) times daily with a meal.    Dispense:  40 tablet    Refill:  0  . cyclobenzaprine (FLEXERIL) 10 MG tablet    Sig: Take 1 tablet (10 mg total) by mouth 3 (three) times daily as needed for muscle spasms.    Dispense:  30 tablet    Refill:  0    Appropriate red flag conditions were discussed with the patient as well as actions that should be taken.  Patient expressed his understanding.  Follow-up: Return if symptoms worsen or fail to improve.  Roselee Culver, MD    Results for orders placed or performed in visit on 07/14/15  POCT urinalysis dipstick  Result Value Ref Range   Color, UA yellow yellow   Clarity, UA clear clear   Glucose, UA negative negative   Bilirubin, UA negative negative   Ketones, POC UA negative negative   Spec Grav, UA 1.015     Blood, UA negative negative   pH, UA 5.0    Protein Ur, POC negative negative   Urobilinogen, UA 0.2    Nitrite, UA Negative Negative   Leukocytes, UA Negative Negative  POCT Microscopic Urinalysis ([facility])  Result Value Ref Range   WBC,UR,HPF,POC None None WBC/hpf   RBC,UR,HPF,POC None None RBC/hpf   Bacteria Few (A) None, Too numerous to count   Mucus Present (A) Absent   Epithelial Cells, UR Per Microscopy Moderate (A) None, Too numerous to count cells/hpf   Crystals Starch

## 2015-08-05 ENCOUNTER — Emergency Department (INDEPENDENT_AMBULATORY_CARE_PROVIDER_SITE_OTHER): Payer: BLUE CROSS/BLUE SHIELD

## 2015-08-05 ENCOUNTER — Encounter (HOSPITAL_COMMUNITY): Payer: Self-pay | Admitting: *Deleted

## 2015-08-05 ENCOUNTER — Emergency Department (INDEPENDENT_AMBULATORY_CARE_PROVIDER_SITE_OTHER)
Admission: EM | Admit: 2015-08-05 | Discharge: 2015-08-05 | Disposition: A | Discharge status: Home / Self Care | Payer: Self-pay | Source: Home / Self Care | Attending: Emergency Medicine | Admitting: Emergency Medicine

## 2015-08-05 DIAGNOSIS — T148 Other injury of unspecified body region: Secondary | ICD-10-CM | POA: Diagnosis not present

## 2015-08-05 DIAGNOSIS — M949 Disorder of cartilage, unspecified: Secondary | ICD-10-CM

## 2015-08-05 DIAGNOSIS — R0789 Other chest pain: Secondary | ICD-10-CM

## 2015-08-05 DIAGNOSIS — T148XXA Other injury of unspecified body region, initial encounter: Secondary | ICD-10-CM

## 2015-08-05 MED ORDER — TRAMADOL HCL 50 MG PO TABS: 50.0000 mg | ORAL_TABLET | Freq: Four times a day (QID) | ORAL | Status: DC | PRN
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
## 2015-08-05 NOTE — ED Notes (Signed)
Pt declining EKG until she speaks with provider.  Provider notified.

## 2015-12-26 ENCOUNTER — Encounter (HOSPITAL_COMMUNITY): Payer: Self-pay | Admitting: *Deleted

## 2015-12-26 ENCOUNTER — Emergency Department (HOSPITAL_BASED_OUTPATIENT_CLINIC_OR_DEPARTMENT_OTHER)
Admit: 2015-12-26 | Discharge: 2015-12-26 | Disposition: A | Discharge status: Home / Self Care | Payer: BLUE CROSS/BLUE SHIELD | Attending: Emergency Medicine | Admitting: Emergency Medicine

## 2015-12-26 ENCOUNTER — Emergency Department (HOSPITAL_COMMUNITY)
Admission: EM | Admit: 2015-12-26 | Discharge: 2015-12-26 | Disposition: A | Discharge status: Home / Self Care | Payer: BLUE CROSS/BLUE SHIELD | Attending: Emergency Medicine | Admitting: Emergency Medicine

## 2015-12-26 DIAGNOSIS — Z8659 Personal history of other mental and behavioral disorders: Secondary | ICD-10-CM | POA: Insufficient documentation

## 2015-12-26 DIAGNOSIS — I82492 Acute embolism and thrombosis of other specified deep vein of left lower extremity: Secondary | ICD-10-CM | POA: Insufficient documentation

## 2015-12-26 DIAGNOSIS — I8000 Phlebitis and thrombophlebitis of superficial vessels of unspecified lower extremity: Secondary | ICD-10-CM

## 2015-12-26 DIAGNOSIS — Z8719 Personal history of other diseases of the digestive system: Secondary | ICD-10-CM | POA: Diagnosis not present

## 2015-12-26 DIAGNOSIS — G4700 Insomnia, unspecified: Secondary | ICD-10-CM | POA: Insufficient documentation

## 2015-12-26 DIAGNOSIS — M791 Myalgia: Secondary | ICD-10-CM | POA: Insufficient documentation

## 2015-12-26 DIAGNOSIS — F17210 Nicotine dependence, cigarettes, uncomplicated: Secondary | ICD-10-CM | POA: Diagnosis not present

## 2015-12-26 DIAGNOSIS — I82402 Acute embolism and thrombosis of unspecified deep veins of left lower extremity: Secondary | ICD-10-CM

## 2015-12-26 DIAGNOSIS — M79605 Pain in left leg: Secondary | ICD-10-CM | POA: Diagnosis present

## 2015-12-26 MED ORDER — RIVAROXABAN 15 MG PO TABS
15.0000 mg | ORAL_TABLET | Freq: Once | ORAL | Status: AC
  Administered 2015-12-26: 15 mg via ORAL
  Filled 2015-12-26: qty 1

## 2015-12-26 MED ORDER — RIVAROXABAN (XARELTO) VTE STARTER PACK (15 & 20 MG): ORAL_TABLET | ORAL | Status: DC

## 2015-12-26 NOTE — Discharge Instructions (Signed)
Return to the ER if you have any shortness of breath, dizziness, fainting.   Deep Vein Thrombosis A deep vein thrombosis (DVT) is a blood clot (thrombus) that usually occurs in a deep, larger vein of the lower leg or the pelvis, or in an upper extremity such as the arm. These are dangerous and can lead to serious and even life-threatening complications if the clot travels to the lungs. A DVT can damage the valves in your leg veins so that instead of flowing upward, the blood pools in the lower leg. This is called post-thrombotic syndrome, and it can result in pain, swelling, discoloration, and sores on the leg. CAUSES A DVT is caused by the formation of a blood clot in your leg, pelvis, or arm. Usually, several things contribute to the formation of blood clots. A clot may develop when:  Your blood flow slows down.  Your vein becomes damaged in some way.  You have a condition that makes your blood clot more easily. RISK FACTORS A DVT is more likely to develop in:  People who are older, especially over 22 years of age.  People who are overweight (obese).  People who sit or lie still for a long time, such as during long-distance travel (over 4 hours), bed rest, hospitalization, or during recovery from certain medical conditions like a stroke.  People who do not engage in much physical activity (sedentary lifestyle).  People who have chronic breathing disorders.  People who have a personal or family history of blood clots or blood clotting disease.  People who have peripheral vascular disease (PVD), diabetes, or some types of cancer.  People who have heart disease, especially if the person had a recent heart attack or has congestive heart failure.  People who have neurological diseases that affect the legs (leg paresis).  People who have had a traumatic injury, such as breaking a hip or leg.  People who have recently had major or lengthy surgery, especially on the hip, knee, or  abdomen.  People who have had a central line placed inside a large vein.  People who take medicines that contain the hormone estrogen. These include birth control pills and hormone replacement therapy.  Pregnancy or during childbirth or the postpartum period.  Long plane flights (over 8 hours). SIGNS AND SYMPTOMS Symptoms of a DVT can include:   Swelling of your leg or arm, especially if one side is much worse.  Warmth and redness of your leg or arm, especially if one side is much worse.  Pain in your arm or leg. If the clot is in your leg, symptoms may be more noticeable or worse when you stand or walk.  A feeling of pins and needles, if the clot is in the arm. The symptoms of a DVT that has traveled to the lungs (pulmonary embolism, PE) usually start suddenly and include:  Shortness of breath while active or at rest.  Coughing or coughing up blood or blood-tinged mucus.  Chest pain that is often worse with deep breaths.  Rapid or irregular heartbeat.  Feeling light-headed or dizzy.  Fainting.  Feeling anxious.  Sweating. There may also be pain and swelling in a leg if that is where the blood clot started. These symptoms may represent a serious problem that is an emergency. Do not wait to see if the symptoms will go away. Get medical help right away. Call your local emergency services (911 in the [place] Do not drive yourself to the hospital. DIAGNOSIS Your health  care provider will take a medical history and perform a physical exam. You may also have other tests, including:  Blood tests to assess the clotting properties of your blood.  Imaging tests, such as CT, ultrasound, MRI, X-ray, and other tests to see if you have clots anywhere in your body. TREATMENT After a DVT is identified, it can be treated. The type of treatment that you receive depends on many factors, such as the cause of your DVT, your risk for bleeding or developing more clots, and other medical  conditions that you have. Sometimes, a combination of treatments is necessary. Treatment options may be combined and include:  Monitoring the blood clot with ultrasound.  Taking medicines by mouth, such as newer blood thinners (anticoagulants), thrombolytics, or warfarin.  Taking anticoagulant medicine by injection or through an IV tube.  Wearing compression stockings or using different types ofdevices.  Surgery (rare) to remove the blood clot or to place a filter in your abdomen to stop the blood clot from traveling to your lungs. Treatments for a DVT are often divided into immediate treatment and long-term treatment (up to 3 months after DVT). You can work with your health care provider to choose the treatment program that is best for you. HOME CARE INSTRUCTIONS If you are taking a newer oral anticoagulant:  Take the medicine every single day at the same time each day.  Understand what foods and drugs interact with this medicine.  Understand that there are no regular blood tests required when using this medicine.  Understand the side effects of this medicine, including excessive bruising or bleeding. Ask your health care provider or pharmacist about other possible side effects. If you are taking warfarin:  Understand how to take warfarin and know which foods can affect how warfarin works in Veterinary surgeon.  Understand that it is dangerous to take too much or too little warfarin. Too much warfarin increases the risk of bleeding. Too little warfarin continues to allow the risk for blood clots.  Follow your PT and INR blood testing schedule. The PT and INR results allow your health care provider to adjust your dose of warfarin. It is very important that you have your PT and INR tested as often as told by your health care provider.  Avoid major changes in your diet, or tell your health care provider before you change your diet. Arrange a visit with a registered dietitian to answer your  questions. Many foods, especially foods that are high in vitamin K, can interfere with warfarin and affect the PT and INR results. Eat a consistent amount of foods that are high in vitamin K, such as:  Spinach, kale, broccoli, cabbage, collard greens, turnip greens, Brussels sprouts, peas, cauliflower, seaweed, and parsley.  Beef liver and pork liver.  Green tea.  Soybean oil.  Tell your health care provider about any and all medicines, vitamins, and supplements that you take, including aspirin and other over-the-counter anti-inflammatory medicines. Be especially cautious with aspirin and anti-inflammatory medicines. Do not take those before you ask your health care provider if it is safe to do so. This is important because many medicines can interfere with warfarin and affect the PT and INR results.  Do not start or stop taking any over-the-counter or prescription medicine unless your health care provider or pharmacist tells you to do so. If you take warfarin, you will also need to do these things:  Hold pressure over cuts for longer than usual.  Tell your dentist and other  health care providers that you are taking warfarin before you have any procedures in which bleeding may occur.  Avoid alcohol or drink very small amounts. Tell your health care provider if you change your alcohol intake.  Do not use tobacco products, including cigarettes, chewing tobacco, and e-cigarettes. If you need help quitting, ask your health care provider.  Avoid contact sports. General Instructions  Take over-the-counter and prescription medicines only as told by your health care provider. Anticoagulant medicines can have side effects, including easy bruising and difficulty stopping bleeding. If you are prescribed an anticoagulant, you will also need to do these things:  Hold pressure over cuts for longer than usual.  Tell your dentist and other health care providers that you are taking anticoagulants before  you have any procedures in which bleeding may occur.  Avoid contact sports.  Wear a medical alert bracelet or carry a medical alert card that says you have had a PE.  Ask your health care provider how soon you can go back to your normal activities. Stay active to prevent new blood clots from forming.  Make sure to exercise while traveling or when you have been sitting or standing for a long period of time. It is very important to exercise. Exercise your legs by walking or by tightening and relaxing your leg muscles often. Take frequent walks.  Wear compression stockings as told by your health care provider to help prevent more blood clots from forming.  Do not use tobacco products, including cigarettes, chewing tobacco, and e-cigarettes. If you need help quitting, ask your health care provider.  Keep all follow-up appointments with your health care provider. This is important. PREVENTION Take these actions to decrease your risk of developing another DVT:  Exercise regularly. For at least 30 minutes every day, engage in:  Activity that involves moving your arms and legs.  Activity that encourages good blood flow through your body by increasing your heart rate.  Exercise your arms and legs every hour during long-distance travel (over 4 hours). Drink plenty of water and avoid drinking alcohol while traveling.  Avoid sitting or lying in bed for long periods of time without moving your legs.  Maintain a weight that is appropriate for your height. Ask your health care provider what weight is healthy for you.  If you are a woman who is over 70 years of age, avoid unnecessary use of medicines that contain estrogen. These include birth control pills.  Do not smoke, especially if you take estrogen medicines. If you need help quitting, ask your health care provider. If you are hospitalized, prevention measures may include:  Early walking after surgery, as soon as your health care provider  says that it is safe.  Receiving anticoagulants to prevent blood clots.If you cannot take anticoagulants, other options may be available, such as wearing compression stockings or using different types of devices. SEEK IMMEDIATE MEDICAL CARE IF:  You have new or increased pain, swelling, or redness in an arm or leg.  You have numbness or tingling in an arm or leg.  You have shortness of breath while active or at rest.  You have chest pain.  You have a rapid or irregular heartbeat.  You feel light-headed or dizzy.  You cough up blood.  You notice blood in your vomit, bowel movement, or urine. These symptoms may represent a serious problem that is an emergency. Do not wait to see if the symptoms will go away. Get medical help right away. Call your  local emergency services (911 in the [place] Do not drive yourself to the hospital.   This information is not intended to replace advice given to you by your health care provider. Make sure you discuss any questions you have with your health care provider.   Document Released: 09/02/2005 Document Revised: 05/24/2015 Document Reviewed: 12/28/2014 Elsevier Interactive Patient Education 2016 [employer] Rivaroxaban oral tablets What is this medicine? RIVAROXABAN (ri va ROX a ban) is an anticoagulant (blood thinner). It is used to treat blood clots in the lungs or in the veins. It is also used after knee or hip surgeries to prevent blood clots. It is also used to lower the chance of stroke in people with a medical condition called atrial fibrillation. This medicine may be used for other purposes; ask your health care provider or pharmacist if you have questions. What should I tell my health care provider before I take this medicine? They need to know if you have any of these conditions: -bleeding disorders -bleeding in the brain -blood in your stools (black or tarry stools) or if you have blood in your vomit -history of stomach  bleeding -kidney disease -liver disease -low blood counts, like low white cell, platelet, or red cell counts -recent or planned spinal or epidural procedure -take medicines that treat or prevent blood clots -an unusual or allergic reaction to rivaroxaban, other medicines, foods, dyes, or preservatives -pregnant or trying to get pregnant -breast-feeding How should I use this medicine? Take this medicine by mouth with a glass of water. Follow the directions on the prescription label. Take your medicine at regular intervals. Do not take it more often than directed. Do not stop taking except on your doctor's advice. Stopping this medicine may increase your risk of a blood clot. Be sure to refill your prescription before you run out of medicine. If you are taking this medicine after hip or knee replacement surgery, take it with or without food. If you are taking this medicine for atrial fibrillation, take it with your evening meal. If you are taking this medicine to treat blood clots, take it with food at the same time each day. If you are unable to swallow your tablet, you may crush the tablet and mix it in applesauce. Then, immediately eat the applesauce. You should eat more food right after you eat the applesauce containing the crushed tablet. Talk to your pediatrician regarding the use of this medicine in children. Special care may be needed. Overdosage: If you think you have taken too much of this medicine contact a poison control center or emergency room at once. NOTE: This medicine is only for you. Do not share this medicine with others. What if I miss a dose? If you take your medicine once a day and miss a dose, take the missed dose as soon as you remember. If you take your medicine twice a day and miss a dose, take the missed dose immediately. In this instance, 2 tablets may be taken at the same time. The next day you should take 1 tablet twice a day as directed. What may interact with this  medicine? -aspirin and aspirin-like medicines -certain antibiotics like erythromycin, azithromycin, and clarithromycin -certain medicines for fungal infections like ketoconazole and itraconazole -certain medicines for irregular heart beat like amiodarone, quinidine, dronedarone -certain medicines for seizures like carbamazepine, phenytoin -certain medicines that treat or prevent blood clots like warfarin, enoxaparin, and dalteparin -conivaptan -diltiazem -felodipine -indinavir -lopinavir; ritonavir -NSAIDS, medicines for pain and inflammation, like  ibuprofen or naproxen -ranolazine -rifampin -ritonavir -St. John's wort -verapamil This list may not describe all possible interactions. Give your health care provider a list of all the medicines, herbs, non-prescription drugs, or dietary supplements you use. Also tell them if you smoke, drink alcohol, or use illegal drugs. Some items may interact with your medicine. What should I watch for while using this medicine? Visit your doctor or health care professional for regular checks on your progress. Your condition will be monitored carefully while you are receiving this medicine. Notify your doctor or health care professional and seek emergency treatment if you develop breathing problems; changes in vision; chest pain; severe, sudden headache; pain, swelling, warmth in the leg; trouble speaking; sudden numbness or weakness of the face, arm, or leg. These can be signs that your condition has gotten worse. If you are going to have surgery, tell your doctor or health care professional that you are taking this medicine. Tell your health care professional that you use this medicine before you have a spinal or epidural procedure. Sometimes people who take this medicine have bleeding problems around the spine when they have a spinal or epidural procedure. This bleeding is very rare. If you have a spinal or epidural procedure while on this medicine, call  your health care professional immediately if you have back pain, numbness or tingling (especially in your legs and feet), muscle weakness, paralysis, or loss of bladder or bowel control. Avoid sports and activities that might cause injury while you are using this medicine. Severe falls or injuries can cause unseen bleeding. Be careful when using sharp tools or knives. Consider using an Copy. Take special care brushing or flossing your teeth. Report any injuries, bruising, or red spots on the skin to your doctor or health care professional. What side effects may I notice from receiving this medicine? Side effects that you should report to your doctor or health care professional as soon as possible: -allergic reactions like skin rash, itching or hives, swelling of the face, lips, or tongue -back pain -redness, blistering, peeling or loosening of the skin, including inside the mouth -signs and symptoms of bleeding such as bloody or black, tarry stools; red or dark-brown urine; spitting up blood or brown material that looks like coffee grounds; red spots on the skin; unusual bruising or bleeding from the eye, gums, or nose Side effects that usually do not require medical attention (Report these to your doctor or health care professional if they continue or are bothersome.): -dizziness -muscle pain This list may not describe all possible side effects. Call your doctor for medical advice about side effects. You may report side effects to FDA at 1-800-FDA-1088. Where should I keep my medicine? Keep out of the reach of children. Store at room temperature between 15 and 30 degrees C (59 and 86 degrees F). Throw away any unused medicine after the expiration date. NOTE: This sheet is a summary. It may not cover all possible information. If you have questions about this medicine, talk to your doctor, pharmacist, or health care provider.    2016, Elsevier/[employer] Standard. (2014-08-31 12:45:34)

## 2015-12-26 NOTE — ED Notes (Signed)
Vascular at bedside

## 2015-12-26 NOTE — ED Provider Notes (Signed)
CSN: [id number]     Arrival date & time 12/26/15  0706 History   First MD Initiated Contact with Patient 12/26/15 1233     Chief Complaint  Patient presents with  . Leg Pain     (Consider location/radiation/quality/duration/timing/severity/associated sxs/prior Treatment) HPI Comments: Pt comes in with cc of L leg pain. Pt reports that she started having swelling and pain in her leg about 2 days ago, today the pain was so severe that she had hard time even getting up and thus she decide to come to the ER. Pt has no hx of PE, DVT and denies any exogenous estrogen use,  surgery in the past 6 weeks, active cancer, recent immobilization. She does report that she has been traveling back and forth to [place] a lot in the past 3 weeks (6 hour drive one way). She denies any dib, chest pain.  Patient is a 60 y.o. female presenting with leg pain. The history is provided by the patient.  Leg Pain Associated symptoms: no neck pain     Past Medical History  Diagnosis Date  . Insomnia   . Multiple sclerosis ([place])   . Hiatal hernia with gastroesophageal reflux     disease  . Diverticulitis   . Irritable bowel syndrome   . Panic attacks     Hx of   Past Surgical History  Procedure Laterality Date  . Tubal ligation    . US echocardiography  01-31-2009    EF 60%  . Cholecystectomy     No family history on file. Social History  Substance Use Topics  . Smoking status: Current Every Day Smoker -- 0.50 packs/day    Types: Cigarettes  . Smokeless tobacco: None  . Alcohol Use: No   OB History    No data available     Review of Systems  Constitutional: Positive for activity change.  Respiratory: Negative for shortness of breath.   Cardiovascular: Negative for chest pain.  Gastrointestinal: Negative for nausea and vomiting.  Musculoskeletal: Positive for myalgias. Negative for neck pain.  Skin: Negative for rash.  Allergic/Immunologic: Negative for immunocompromised state.   Neurological: Negative for headaches.  Hematological: Does not bruise/bleed easily.      Allergies  Review of patient's allergies indicates no known allergies.  Home Medications   Prior to Admission medications   Medication Sig Start Date End Date Taking? Authorizing Provider  amoxicillin-clavulanate (AUGMENTIN) 875-125 MG tablet Take 1 tablet by mouth 2 (two) times daily.   Yes Historical Provider, MD  predniSONE (DELTASONE) 20 MG tablet Take 20 mg by mouth daily with breakfast.   Yes Historical Provider, MD  cyclobenzaprine (FLEXERIL) 10 MG tablet Take 1 tablet (10 mg total) by mouth 3 (three) times daily as needed for muscle spasms. Patient not taking: Reported on 12/26/2015 07/14/15   Roselee Culver, MD  naproxen sodium (ANAPROX DS) 550 MG tablet Take 1 tablet (550 mg total) by mouth 2 (two) times daily with a meal. Patient not taking: Reported on 12/26/2015 07/14/15 07/13/16  Roselee Culver, MD  Rivaroxaban 15 & 20 MG TBPK Take as directed on package: Start with one 15mg  tablet by mouth twice a day with food. On Day 22, switch to one 20mg  tablet once a day with food. 12/26/15   Varney Biles, MD  traMADol (ULTRAM) 50 MG tablet Take 1 tablet (50 mg total) by mouth every 6 (six) hours as needed. Patient not taking: Reported on 12/26/2015 08/05/15   Janne Napoleon, NP  Pulse 82  Temp(Src) 97.9 F (36.6 C) (Oral)  Resp 18  Ht 5\' 9"  (1.753 m)  Wt 207 lb 9 oz (94.15 kg)  BMI 30.64 kg/m2  SpO2 97% Physical Exam  Constitutional: She is oriented to person, place, and time. She appears well-developed.  HENT:  Head: Normocephalic and atraumatic.  Eyes: EOM are normal.  Neck: Normal range of motion. Neck supple.  Cardiovascular: Normal rate.   Pulmonary/Chest: Effort normal.  Abdominal: Bowel sounds are normal.  Musculoskeletal:  LLE tenderness with palpation and calf tenderness. + warmth, no redness  Neurological: She is alert and oriented to person, place, and time.  Skin:  Skin is warm and dry. No erythema.  Nursing note and vitals reviewed.   ED Course  Procedures (including critical care time) Labs Review Labs Reviewed - No data to display  Imaging Review No results found. I have personally reviewed and evaluated these images and lab results as part of my medical decision-making.   EKG Interpretation None      MDM   Final diagnoses:  Acute DVT (deep venous thrombosis), left    Pt with LLE swelling and tenderness over the calf.  US done and the results are as below: Preliminary report: Right: No evidence of DVT, superficial thrombosis, or Baker's cyst. Left: DVT noted in the gastrocnemius veins. No evidence of superficial thrombosis. No Baker's cyst.    Pt informed of the diagnosis. Xarelto ordered. PT doesn't want to stay for education on xarelto, we have answered questions to the best of our capacity on the dangers of xarelto, Pt has no pcp, so we have provided information on outpatient doctors.  Strict ER return precautions have been discussed (specifically about PE), and patient is agreeing with the plan and is comfortable with the workup done and the recommendations from the ER.   Varney Biles, MD 12/26/15 [phone]

## 2015-12-26 NOTE — ED Notes (Signed)
Pt c/o L leg pain onse yesterday, denies injury to the area, pt ambulatory with pain, + swelling, calf warm to touch, pt A&O x4, +PMS

## 2015-12-26 NOTE — Progress Notes (Signed)
VASCULAR LAB PRELIMINARY  PRELIMINARY  PRELIMINARY  PRELIMINARY  Bilateral lower extremity venous duplex  completed.    Preliminary report:  Right:  No evidence of DVT, superficial thrombosis, or Baker's cyst.  Left: DVT noted in the gastrocnemius veins.  No evidence of superficial thrombosis.  No Baker's cyst.     Gary Gabrielsen, RVT 12/26/2015, 11:13 AM

## 2015-12-28 ENCOUNTER — Institutional Professional Consult (permissible substitution): Payer: BLUE CROSS/BLUE SHIELD | Admitting: Family Medicine

## 2015-12-31 ENCOUNTER — Emergency Department (HOSPITAL_COMMUNITY)
Admission: EM | Admit: 2015-12-31 | Discharge: 2015-12-31 | Disposition: A | Discharge status: Home / Self Care | Payer: BLUE CROSS/BLUE SHIELD | Attending: Emergency Medicine | Admitting: Emergency Medicine

## 2015-12-31 ENCOUNTER — Encounter (HOSPITAL_COMMUNITY): Payer: Self-pay | Admitting: *Deleted

## 2015-12-31 DIAGNOSIS — I82402 Acute embolism and thrombosis of unspecified deep veins of left lower extremity: Secondary | ICD-10-CM

## 2015-12-31 DIAGNOSIS — M79662 Pain in left lower leg: Secondary | ICD-10-CM | POA: Diagnosis present

## 2015-12-31 DIAGNOSIS — I82492 Acute embolism and thrombosis of other specified deep vein of left lower extremity: Secondary | ICD-10-CM | POA: Insufficient documentation

## 2015-12-31 DIAGNOSIS — Z7952 Long term (current) use of systemic steroids: Secondary | ICD-10-CM | POA: Diagnosis not present

## 2015-12-31 DIAGNOSIS — Z791 Long term (current) use of non-steroidal anti-inflammatories (NSAID): Secondary | ICD-10-CM | POA: Diagnosis not present

## 2015-12-31 DIAGNOSIS — F17210 Nicotine dependence, cigarettes, uncomplicated: Secondary | ICD-10-CM | POA: Insufficient documentation

## 2015-12-31 MED ORDER — HYDROCODONE-ACETAMINOPHEN 5-325 MG PO TABS
1.0000 | ORAL_TABLET | Freq: Once | ORAL | Status: AC
  Administered 2015-12-31: 1 via ORAL
  Filled 2015-12-31: qty 1

## 2015-12-31 MED ORDER — ACETAMINOPHEN 325 MG PO TABS
650.0000 mg | ORAL_TABLET | Freq: Once | ORAL | Status: AC
  Administered 2015-12-31: 650 mg via ORAL
  Filled 2015-12-31: qty 2

## 2015-12-31 NOTE — ED Notes (Signed)
Pt states she has a blood clot in her left leg. Pt states she was put on xarelto and has had a headache since yesterday. Pt c/o left calf pain and unable to ambulate without a walker.

## 2015-12-31 NOTE — ED Provider Notes (Signed)
CSN: [id number]     Arrival date & time 12/31/15  1854 History   First MD Initiated Contact with Patient 12/31/15 1911     Chief Complaint  Patient presents with  . Leg Pain     (Consider location/radiation/quality/duration/timing/severity/associated sxs/prior Treatment) Patient is a 60 y.o. female presenting with leg pain.  Leg Pain Location:  Leg Injury: no   Leg location:  L leg and L lower leg Pain details:    Quality:  Aching, sharp and throbbing   Severity:  Severe   Duration:  6 days   Timing:  Constant Chronicity:  New Dislocation: no   Prior injury to area:  No   Past Medical History  Diagnosis Date  . Insomnia   . Multiple sclerosis ([place])   . Hiatal hernia with gastroesophageal reflux     disease  . Diverticulitis   . Irritable bowel syndrome   . Panic attacks     Hx of   Past Surgical History  Procedure Laterality Date  . Tubal ligation    . US echocardiography  01-31-2009    EF 60%  . Cholecystectomy     History reviewed. No pertinent family history. Social History  Substance Use Topics  . Smoking status: Current Every Day Smoker -- 0.50 packs/day    Types: Cigarettes  . Smokeless tobacco: None  . Alcohol Use: No   OB History    No data available     Review of Systems  Musculoskeletal:       Left leg pain  Skin: Negative for color change and pallor.  All other systems reviewed and are negative.     Allergies  Review of patient's allergies indicates no known allergies.  Home Medications   Prior to Admission medications   Medication Sig Start Date End Date Taking? Authorizing Provider  amoxicillin-clavulanate (AUGMENTIN) 875-125 MG tablet Take 1 tablet by mouth 2 (two) times daily.    Historical Provider, MD  cyclobenzaprine (FLEXERIL) 10 MG tablet Take 1 tablet (10 mg total) by mouth 3 (three) times daily as needed for muscle spasms. Patient not taking: Reported on 12/26/2015 07/14/15   Roselee Culver, MD  naproxen sodium (ANAPROX  DS) 550 MG tablet Take 1 tablet (550 mg total) by mouth 2 (two) times daily with a meal. Patient not taking: Reported on 12/26/2015 07/14/15 07/13/16  Roselee Culver, MD  predniSONE (DELTASONE) 20 MG tablet Take 20 mg by mouth daily with breakfast.    Historical Provider, MD  Rivaroxaban 15 & 20 MG TBPK Take as directed on package: Start with one 15mg  tablet by mouth twice a day with food. On Day 22, switch to one 20mg  tablet once a day with food. 12/26/15   Varney Biles, MD  traMADol (ULTRAM) 50 MG tablet Take 1 tablet (50 mg total) by mouth every 6 (six) hours as needed. Patient not taking: Reported on 12/26/2015 08/05/15   Janne Napoleon, NP   BP 118/97 mmHg  Pulse 96  Temp(Src) 98 F (36.7 C) (Oral)  Resp 18  Ht 5\' 9"  (1.753 m)  Wt 207 lb (93.895 kg)  BMI 30.55 kg/m2  SpO2 98% Physical Exam  Constitutional: She is oriented to person, place, and time. She appears well-developed and well-nourished.  HENT:  Head: Normocephalic and atraumatic.  Neck: Normal range of motion.  Cardiovascular: Normal rate and regular rhythm.   Pulmonary/Chest: Effort normal. No stridor. No respiratory distress.  Abdominal: Soft. She exhibits no distension. There is no tenderness. There is  no rebound.  Musculoskeletal: Normal range of motion. She exhibits edema (minimal left lower leg) and tenderness (posterior left calf).  Neurological: She is alert and oriented to person, place, and time. No cranial nerve deficit.  Skin: Skin is warm and dry.  Nursing note and vitals reviewed.   ED Course  Procedures (including critical care time) Labs Review Labs Reviewed - No data to display  Imaging Review No results found. I have personally reviewed and evaluated these images and lab results as part of my medical decision-making.   EKG Interpretation None      MDM   Final diagnoses:  None    Here wanting work note as she has an active job and still can't walk without a walker 2/2 severe pain in her  leg with cramping as well. Doesn't want Rx just wanted information. Exam without e/o phlegmasia, reportedly swelling improved, intact DP pulse, taking xarelto as prescribed.  Will need follow up with someone to prescribe xarelto and workup for blood disorders, patient is working on it and I gave her [facility] number as well.  No other needs identified.  New Prescriptions: New Prescriptions   No medications on file     I have personally and contemperaneously reviewed labs and imaging and used in my decision making as above.   A medical screening exam was performed and I feel the patient has had an appropriate workup for their chief complaint at this time and likelihood of emergent condition existing is low. Their vital signs are stable. They have been counseled on decision, discharge, follow up and which symptoms necessitate immediate return to the emergency department.  They verbally stated understanding and agreement with plan and discharged in stable condition.      Merrily Pew, MD 12/31/15 [phone]

## 2016-01-03 ENCOUNTER — Institutional Professional Consult (permissible substitution): Payer: BLUE CROSS/BLUE SHIELD | Admitting: Family Medicine

## 2016-01-04 ENCOUNTER — Encounter: Payer: Self-pay | Admitting: *Deleted

## 2016-01-04 ENCOUNTER — Other Ambulatory Visit: Payer: Self-pay | Admitting: Family Medicine

## 2016-01-04 ENCOUNTER — Ambulatory Visit (INDEPENDENT_AMBULATORY_CARE_PROVIDER_SITE_OTHER): Payer: BLUE CROSS/BLUE SHIELD | Admitting: Family Medicine

## 2016-01-04 ENCOUNTER — Encounter: Payer: Self-pay | Admitting: Family Medicine

## 2016-01-04 VITALS — BP 124/72 | HR 76 | Temp 97.5°F | Ht 69.0 in | Wt 207.0 lb

## 2016-01-04 DIAGNOSIS — I82409 Acute embolism and thrombosis of unspecified deep veins of unspecified lower extremity: Secondary | ICD-10-CM

## 2016-01-04 DIAGNOSIS — Z7689 Persons encountering health services in other specified circumstances: Secondary | ICD-10-CM

## 2016-01-04 DIAGNOSIS — Z1239 Encounter for other screening for malignant neoplasm of breast: Secondary | ICD-10-CM

## 2016-01-04 DIAGNOSIS — Z8639 Personal history of other endocrine, nutritional and metabolic disease: Secondary | ICD-10-CM | POA: Diagnosis not present

## 2016-01-04 DIAGNOSIS — I82402 Acute embolism and thrombosis of unspecified deep veins of left lower extremity: Secondary | ICD-10-CM | POA: Diagnosis not present

## 2016-01-04 DIAGNOSIS — Z7189 Other specified counseling: Secondary | ICD-10-CM | POA: Diagnosis not present

## 2016-01-04 HISTORY — DX: Acute embolism and thrombosis of unspecified deep veins of unspecified lower extremity: I82.409

## 2016-01-04 LAB — CBC WITH DIFFERENTIAL/PLATELET
BASOS ABS: 0 {cells}/uL (ref 0–200)
Basophils Relative: 0 %
EOS ABS: 100 {cells}/uL (ref 15–500)
EOS PCT: 1 %
HEMATOCRIT: 40.7 % (ref 35.0–45.0)
HEMOGLOBIN: 13.6 g/dL (ref 11.7–15.5)
LYMPHS ABS: 2300 {cells}/uL (ref 850–3900)
Lymphocytes Relative: 23 %
MCH: 29.2 pg (ref 27.0–33.0)
MCHC: 33.4 g/dL (ref 32.0–36.0)
MCV: 87.5 fL (ref 80.0–100.0)
MONO ABS: 500 {cells}/uL (ref 200–950)
MPV: 10.7 fL (ref 7.5–12.5)
Monocytes Relative: 5 %
NEUTROS ABS: 7100 {cells}/uL (ref 1500–7800)
NEUTROS PCT: 71 %
Platelets: 264 10*3/uL (ref 140–400)
RBC: 4.65 MIL/uL (ref 3.80–5.10)
RDW: 13.6 % (ref 11.0–15.0)
WBC: 10 10*3/uL (ref 4.0–10.5)

## 2016-01-04 LAB — COMPLETE METABOLIC PANEL WITH GFR
ALBUMIN: 3.5 g/dL — AB (ref 3.6–5.1)
ALK PHOS: 69 U/L (ref 33–130)
ALT: 15 U/L (ref 6–29)
AST: 11 U/L (ref 10–35)
BILIRUBIN TOTAL: 0.5 mg/dL (ref 0.2–1.2)
BUN: 8 mg/dL (ref 7–25)
CALCIUM: 9.5 mg/dL (ref 8.6–10.4)
CO2: 23 mmol/L (ref 20–31)
Chloride: 102 mmol/L (ref 98–110)
Creat: 0.71 mg/dL (ref 0.50–0.99)
GFR, Est African American: >89 mL/min (ref >=60)
GLUCOSE: 112 mg/dL — AB (ref 65–99)
POTASSIUM: 4.2 mmol/L (ref 3.5–5.3)
SODIUM: 136 mmol/L (ref 135–146)
TOTAL PROTEIN: 6.5 g/dL (ref 6.1–8.1)

## 2016-01-04 LAB — TSH: TSH: 0.46 m[IU]/L

## 2016-01-04 MED ORDER — RIVAROXABAN 20 MG PO TABS: 20.0000 mg | ORAL_TABLET | Freq: Every day | ORAL | Status: DC

## 2016-01-04 NOTE — Patient Instructions (Addendum)
Continue on Xarelto as prescribed. I sent a prescription for Xarelto to your pharmacy that you may pick up after you complete the starter pack. I recommend that you go for the mammogram that I ordered today.  Follow up in 2 weeks with me.  If you develop chest pain, shortness of breath then I recommend you go to the emergency department.

## 2016-01-04 NOTE — Progress Notes (Signed)
Subjective:    Patient ID: Rhonda Hudson, female    DOB: 1956/05/29, 60 y.o.   MRN: [id number]  HPI Chief Complaint  Patient presents with  . new pt    new pt, hospital follow-up on DVT   She is new to the practice and here for an acute complaint of DVT that was diagnosed in the ED on 12/26/2015. She states she was having left calf pain for 1 day prior to going to the ED. Denies history of blood clots. States she had been riding in a car 4.5 hours a day 2-3 times per week for the past 2 months, and that this was much more sitting than her norm. She went to the nurse at her work and they sent her to the ED for evaluation of LLE on 12/26/2015.   Denies personal or family history of blood clots or blood disorders. No recent surgery or new medications prior to DVT diagnosis. She is a smoker with a 40 pack year history and states she has only been smoking 1 cigarette per day for past 2-3 months.     She is taking Xarelto daily. Received this from the ED and currently is on day 10 of starter pack.  States she had a headache for 3 days in a row but does not have a headache today.   Denies headache, dizziness, fever, chills, chest pain, shortness of breath, cough, abdominal pain, back pain, GI or GU symptoms. Denies bleeding or bruising.   Last colonoscopy: states this was done within past 5 years and no polyps. Dr. Cristina Gong.  Last mammogram: never and refuses. Aunt with breast cancer. States she does not want to know if she has breast cancer.  Last pap smear: within past year ([address] ? [address])  Surgeries: tubal ligation, skin cancer removal (Dr. Allyson Sabal), cholecystectomy 2010 (this was last surgery).   Denies alcohol or drug use.  Lives with 2 sons.  States she has to walk a lot on her job. She had wellness check at work on 12/25/2005. She works at [employer] in [place].  PMH: took synthroid in past but stopped it many years ago, does not recall why she stopped it.   LMP: age  69.   Reviewed allergies, medications, past medical, surgery, family and social history.    Review of Systems Pertinent positives and negatives in the history of present illness.     Objective:   Physical Exam BP 124/72 mmHg  Pulse 76  Temp(Src) 97.5 F (36.4 C) (Oral)  Ht 5\' 9"  (1.753 m)  Wt 207 lb (93.895 kg)  BMI 30.55 kg/m2  SpO2 98%  Alert and in no distress.  Cardiac exam shows a regular sinus rhythm without murmurs or gallops. Lungs are clear to auscultation. Left calf without erythema, bruising, warmth, edema, tenderness noted, otherwise left lower ext exam normal.  Right lower ext exam normal. Distal pulses intact and equal. Ambulating with walker.  Left calf 14" right calf 13.75"      Assessment & Plan:  DVT (deep venous thrombosis), left - Plan: CBC with Differential/Platelet, COMPLETE METABOLIC PANEL WITH GFR  Screening for breast cancer - Plan: MM DIGITAL SCREENING BILATERAL  Encounter to establish care  History of hypothyroidism - Plan: TSH  Discussed the importance of taking Xarelto daily and risks vs benefits of the medication.  She will continue on Xarelto 15 mg bid for the 21 day period and then switch to 20 mg daily. She verbalized understanding of  the need to continue medication.  Discussed that the most likely explanation for the DVT is the long car rides but I recommend that we make sure cancer screenings are current. She is adamant that she will not get a mammogram and after much encouragement and explanation as to why this would be beneficial, she states she will think about getting one. Referral made for mammogram.  Discussed patient with Dr. Redmond School and suspect that she is low risk for future blood clots and no further coagulation studies to be ordered at this time, especially since she is currently being treated with Xarelto. Review of the ED record shows no labs were obtained prior to initiation of Xarelto. Will order basic baseline labs today  including TSH based on history of hypothyroidism per patient.  Plan to keep her on Xarelto a minimum of 3 months and perhaps longer if any indication as to an increased risk of future potential DVT.  She will follow up in 2 weeks. Will need to address additional history found in chart regarding ?MS, anxiety, diverticulitis at that time.

## 2016-01-05 LAB — HEMOGLOBIN A1C
Hgb A1c MFr Bld: 6.4 % — ABNORMAL HIGH (ref <5.7)
MEAN PLASMA GLUCOSE: 137 mg/dL

## 2016-01-08 ENCOUNTER — Encounter: Payer: Self-pay | Admitting: Internal Medicine

## 2016-01-08 ENCOUNTER — Telehealth: Payer: Self-pay | Admitting: *Deleted

## 2016-01-08 NOTE — Telephone Encounter (Signed)
As long as she is having significant pain, I think she should hold off on going back since she stands or walks her entire shift. She can have a note for tonight and then if she wants to give it a try, I am ok with it.

## 2016-01-08 NOTE — Telephone Encounter (Signed)
Pt will come by and pick up note

## 2016-01-08 NOTE — Telephone Encounter (Signed)
Patient called and stated that she was planning on going back to work tonight. She can walk without the walker but leg does tighten up has a burning/throbbing pain when she stands on it for a while. Wants to know what you think, is willing to go back to work tonight if you think she should. Please advise.

## 2016-01-18 ENCOUNTER — Ambulatory Visit (INDEPENDENT_AMBULATORY_CARE_PROVIDER_SITE_OTHER): Payer: BLUE CROSS/BLUE SHIELD | Admitting: Family Medicine

## 2016-01-18 ENCOUNTER — Encounter: Payer: Self-pay | Admitting: Family Medicine

## 2016-01-18 VITALS — BP 118/80 | HR 64 | Wt 207.0 lb

## 2016-01-18 DIAGNOSIS — I82402 Acute embolism and thrombosis of unspecified deep veins of left lower extremity: Secondary | ICD-10-CM

## 2016-01-18 MED ORDER — TRAMADOL HCL 50 MG PO TABS: 50.0000 mg | ORAL_TABLET | Freq: Three times a day (TID) | ORAL | Status: DC | PRN

## 2016-01-18 NOTE — Progress Notes (Signed)
Subjective:    Patient ID: Rhonda Hudson, female    DOB: 06/10/1956, 61 y.o.   MRN: [id number]  HPI Chief Complaint  Patient presents with  . 2 week follow-up    2 week follow-up- works all night and then by mid shift her legs swells up and turns purple   She is a 60 year old female with a 3 week history of DVT to left calf who is here with complaints of left calf pain and tenderness when standing for long periods or walking. States when she is on her leg for several minutes she has swelling, warmth and worsening pain. Pain is relieved at rest. She has taken Tylenol without relief.  She was seen in the emergency department on 12/26/2015 and diagnosed with DVT to left calf. She was started on Xarelto and is currently taking 20 mg daily. She reports good medication compliance. She has not been wearing compression stockings. She is keeping her leg elevated when she is sitting and states pain is relieved when she does this.  States she went back to work last week and worked 4 8 hour shifts and then this past Monday she worked a 12 hour shift and she reports being in a great deal of pain after each shift. Her job requires her to walk and stand for 8 hours on concrete flooring.   She denies fever, chills, chest pain, palpitations, shortness of breath, orthopnea, numbness, tingling or weakness.    Review of Systems Pertinent positives and negatives in the history of present illness.     Objective:   Physical Exam  Constitutional: She is oriented to person, place, and time. She appears well-developed and well-nourished. No distress.  Cardiovascular: Normal rate, regular rhythm and normal heart sounds.  Exam reveals no gallop and no friction rub.   No murmur heard. Pulmonary/Chest: Effort normal and breath sounds normal.  Musculoskeletal:       Left lower leg: She exhibits tenderness. She exhibits no bony tenderness and no swelling.       Legs: Marked tenderness without erythema, edema or  warmth. Normal sensation, capillary refill, pulses. LLE is neurovascularly intact. Pain reports with dorsiflexion of left foot but not with plantarflexion.   Neurological: She is alert and oriented to person, place, and time. She has normal reflexes. No cranial nerve deficit. Coordination normal.  Skin: Skin is warm and dry. No rash noted. No erythema. No pallor.  Psychiatric: She has a normal mood and affect. Her behavior is normal. Judgment and thought content normal.   BP 118/80 mmHg  Pulse 64  Wt 207 lb (93.895 kg)     Assessment & Plan:  DVT (deep venous thrombosis), left Discussed that she appears to have gone back to work and full activity too soon and I recommend that she stay out of work until Sunday May 7th. Discussed that I will release her to work 4 hour shifts starting May 7 for one week and then if she is managing well I will increase her to 8 hour shifts on May 14th. She should not work more than 8 hours for the next month. I also recommend that she buy compression stocking and wear this while at work and during the day if she is off. She can remove it at night. Discussed elevating her leg when she is resting and to do this often for the next few days.  I am prescribing tramadol for pain since Tylenol is not helping and NSAIDS are contraindicated  with Xarelto. She will not drive or operate machinery or drink alcohol with this medication. She should take it and see how she does with it before going back to work.  Short term disability and FMLA paperwork filled out for patient per request.  She will follow up in 1 month or sooner if needed.

## 2016-01-18 NOTE — Patient Instructions (Addendum)
Do not work again until Sunday night 01/21/2016.  You can start back 4 hour shifts if allowed by employer. Do 4 hour shifts for 1 week then see how you are doing. You can increase to 8 hour shifts the next week if you tolerated 4 hours the first week.  Do not work more than 8 hours for the next month.  Continue taking Xarelto as prescribed.  I am prescribing you pain medication today: Tramadol. This medication may make you drowsy, do not take it and drive or operate machinery the first few days until you know how this medication makes you feel. You can take Tylenol while working or instead of tramadol if this controls your pain.  Wear compression stocking to that leg during the day that you can take it off at night. I recommend you wear the compression stocking when you go back to work. Return in 4 weeks.

## 2016-01-26 ENCOUNTER — Ambulatory Visit: Payer: BLUE CROSS/BLUE SHIELD

## 2016-02-16 ENCOUNTER — Ambulatory Visit: Payer: BLUE CROSS/BLUE SHIELD | Admitting: Family Medicine

## 2016-02-22 ENCOUNTER — Ambulatory Visit (INDEPENDENT_AMBULATORY_CARE_PROVIDER_SITE_OTHER): Payer: BLUE CROSS/BLUE SHIELD | Admitting: Family Medicine

## 2016-02-22 ENCOUNTER — Encounter: Payer: Self-pay | Admitting: Family Medicine

## 2016-02-22 VITALS — BP 120/70 | HR 80 | Ht 69.0 in | Wt 207.0 lb

## 2016-02-22 DIAGNOSIS — I82402 Acute embolism and thrombosis of unspecified deep veins of left lower extremity: Secondary | ICD-10-CM | POA: Diagnosis not present

## 2016-02-22 DIAGNOSIS — Z720 Tobacco use: Secondary | ICD-10-CM | POA: Diagnosis not present

## 2016-02-22 DIAGNOSIS — F17200 Nicotine dependence, unspecified, uncomplicated: Secondary | ICD-10-CM

## 2016-02-22 NOTE — Progress Notes (Signed)
Subjective:     Patient ID: Rhonda Hudson, female   DOB: March 23, 1956, 60 y.o.   MRN: [id number]  HPI Ms Rhonda Hudson is here for follow up on a DVT in her left leg 2 months ago without any clear inciting event.  She denies and recent swelling, edema, and warmth of her left leg.  She does complain about bilateral left greater than right leg pain, but says that this has been a constant issue since well before her DVT episodes. She is taking her xarelto every morning and is careful not to miss doses. She did not have any prior history of DVT prior to this and has no family history of DVT or bleeding disorder.  She tried compression stockings for work, but said they were too hot and she did not like how they felt so she no longer uses them.    She is constantly on her feet for her job where she works 8 hrs a day. She is a >40 pack year smoker who has quit in the past, but is not interested in quitting today.   Review of Systems     Objective:   Physical Exam Alert and in no distress. Cardiac exam shows a regular sinus rhythm without murmurs or gallops. Lungs are clear to auscultation. Non-edematous legs without warmth or eyrthema.    Assessment:     DVT (deep venous thrombosis), left  Current smoker  Will continue prophylactic xarelto for 1 month.  Encouraged her to consider quitting smoking today and talked about our availability for help in the future if she decides to quit. She will schedule a complete exam in the next couple of months and can discuss her desire to quit smoking and causes of her DVT at that time.

## 2016-02-27 LAB — HM MAMMOGRAPHY

## 2016-02-29 ENCOUNTER — Telehealth: Payer: Self-pay | Admitting: Family Medicine

## 2016-02-29 NOTE — Telephone Encounter (Signed)
Pt was notified and scheduled for 11:45am for tomorrow

## 2016-02-29 NOTE — Telephone Encounter (Signed)
Pt called and stated that she is having leg pain in same area as blood clot today. She wanted to know if that was common with the medication she is on. Situation was discussed with CMA and due to the fact that only provider in is Dr. Tomi Bamberger who is with pts, she was advised to go to ER. PT was informed and she stated that "going to ER was too much trouble". Sending to Carter to advise.

## 2016-02-29 NOTE — Telephone Encounter (Signed)
Have her take Tylenol and schedule an appointment to see me tomorrow

## 2016-03-01 ENCOUNTER — Telehealth: Payer: Self-pay | Admitting: Medical

## 2016-03-01 ENCOUNTER — Ambulatory Visit: Payer: BLUE CROSS/BLUE SHIELD | Admitting: Family Medicine

## 2016-03-01 ENCOUNTER — Encounter: Payer: Self-pay | Admitting: Family Medicine

## 2016-03-01 NOTE — Telephone Encounter (Signed)
Mammogram normal/no worrisome findings. 

## 2016-03-05 NOTE — Telephone Encounter (Signed)
Pls see message

## 2016-03-06 NOTE — Telephone Encounter (Signed)
Pt was notified of results

## 2016-03-06 NOTE — Telephone Encounter (Signed)
Left message for pt to call me back 

## 2016-03-08 ENCOUNTER — Encounter: Payer: Self-pay | Admitting: Family Medicine

## 2016-04-20 ENCOUNTER — Other Ambulatory Visit: Payer: Self-pay | Admitting: Family Medicine

## 2016-04-20 DIAGNOSIS — I82402 Acute embolism and thrombosis of unspecified deep veins of left lower extremity: Secondary | ICD-10-CM

## 2016-04-22 NOTE — Telephone Encounter (Signed)
She can stop the Xarelto. She has been on the medication for 4 months. Please call and  have her come in for a visit to see me in the next 2-3 weeks. She needs a CPE and fasting labs. Let's try to get her to schedule this.

## 2016-04-22 NOTE — Telephone Encounter (Signed)
Pt has an cpe Friday and will talk about leg pain then. Will deny this med for now

## 2016-04-22 NOTE — Telephone Encounter (Signed)
Is this okay to refill? 

## 2016-04-26 ENCOUNTER — Encounter: Payer: BLUE CROSS/BLUE SHIELD | Admitting: Family Medicine

## 2016-05-30 ENCOUNTER — Encounter: Payer: Self-pay | Admitting: Family Medicine

## 2016-05-30 ENCOUNTER — Ambulatory Visit (INDEPENDENT_AMBULATORY_CARE_PROVIDER_SITE_OTHER): Payer: BLUE CROSS/BLUE SHIELD | Admitting: Family Medicine

## 2016-05-30 VITALS — BP 120/72 | HR 80 | Ht 69.0 in | Wt 204.0 lb

## 2016-05-30 DIAGNOSIS — M79662 Pain in left lower leg: Secondary | ICD-10-CM

## 2016-05-30 DIAGNOSIS — Z86718 Personal history of other venous thrombosis and embolism: Secondary | ICD-10-CM | POA: Diagnosis not present

## 2016-05-30 DIAGNOSIS — F17200 Nicotine dependence, unspecified, uncomplicated: Secondary | ICD-10-CM

## 2016-05-30 DIAGNOSIS — Z23 Encounter for immunization: Secondary | ICD-10-CM | POA: Diagnosis not present

## 2016-05-30 DIAGNOSIS — Z720 Tobacco use: Secondary | ICD-10-CM

## 2016-05-30 NOTE — Patient Instructions (Addendum)
We are going to arrange for a follow-up ultrasound on the left leg, to re-evaluate for any persistent or recurrent blood clot. If this is negative, we may then send you to another doctor (vein doctor) for further evaluation of your pain. I highly encourage use of compression socks/stockings, especially when at work.  Please try and quit smoking--start thinking about why/when you smoke (habit, boredom, stress) in order to come up with effective strategies to cut back or quit. Available resources to help you quit include free counseling through [facility] Quitline ([place]Quitline.[facility] or 1-800-QUITNOW), smoking cessation classes through [facility] (call to find out schedule), over-the-counter nicotine replacements, and e-cigarettes (although this may not help break the hand-mouth habit).  Many insurance companies also have smoking cessation programs (which may decrease the cost of patches, meds if enrolled).  If these methods are not effective for you, and you are motivated to quit, return to discuss the possibility of prescription medications.

## 2016-05-30 NOTE — Progress Notes (Signed)
Chief Complaint  Patient presents with  . Leg Pain    had blood clot in left leg and where the clot was has been bothering her since April.    She is complaining of ongoing pain in the left calf since her blood clot in April.  She took xarelto for 3 months. It hurts now for her to pick up her leg to get dressed, or to put on her shoes (crossing her leg). She had swelling in the left calf after working on Monday night (works nights).  Doesn't usually have swelling every night, but only when she "works real hard". She tried 10-69mm compression socks "but we're not friends". Didn't like them, refuses to wear compression stockings. Swelling usually goes down when leg is elevated.  She had called here in mid June (shortly after her last visit with Dr. Redmond School) complaining of pain, told to come in for re-eval.  She never came for the appointment. She reports having ongoing pain since June, about the same, no worse, no better.  Denies any change in activity, immobility, trauma.  PMH, PSH, SH reviewed  Outpatient Encounter Prescriptions as of 05/30/2016  Medication Sig  . rivaroxaban (XARELTO) 20 MG TABS tablet Take 1 tablet (20 mg total) by mouth daily with supper. (Patient not taking: Reported on 05/30/2016)  . [DISCONTINUED] Rivaroxaban 15 & 20 MG TBPK Take as directed on package: Start with one 15mg  tablet by mouth twice a day with food. On Day 22, switch to one 20mg  tablet once a day with food. (Patient not taking: Reported on 05/30/2016)  . [DISCONTINUED] traMADol (ULTRAM) 50 MG tablet Take 1 tablet (50 mg total) by mouth every 8 (eight) hours as needed.   No facility-administered encounter medications on file as of 05/30/2016.    No Known Allergies  ROS: no fever, chills, no chest pain, shortness of breath, URI symptoms. No bleeding, bruising, rash.  Leg swelling intermittently as per HPI.  PHYSICAL EXAM: BP 120/72 (BP Location: Left Arm, Patient Position: Sitting, Cuff Size: Normal)    Pulse 80   Ht 5\' 9"  (1.753 m)   Wt 204 lb (92.5 kg)   BMI 30.13 kg/m   Well developed, pleasant female, in no acute distress  Diffuse mild tenderness over the left posterior calf. No cords or induration. Some prominent superficial veins noted anteriorly on the left shin. 2+ pulses No asymmetry noted with the right calf Neck: no mass Heart: regular rate and rhythm Lungs: clear bilaterally Neuro: alert and oriented, cranial nerves intact, normal strength, gait Psych: normal mood (slightly irritable), normal hygiene, grooming, eye contact, speech   ASSESSMENT/PLAN:  Calf pain, left - Plan: US Venous Img Lower Unilateral Left  Need for prophylactic vaccination and inoculation against influenza - Plan: Flu Vaccine QUAD 36+ mos PF IM (Fluarix & Fluzone Quad PF)  History of DVT (deep vein thrombosis) - Plan: US Venous Img Lower Unilateral Left  Current smoker - counseled, cessation encouraged, risks reviewed     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  Some intermittent swelling when on her feet a lot. ?ongoing clot, vs post-phlebitic syndrome. No acute change or symptoms of PE to warrant emergent eval (sx ongoing x 3 mos without change).  Repeat LLE ultrasound to see if any persistent/recurrent clot.  Refuses compression stocking--strongly encouraged.  If negative, may be related to the varicose veins, vs post-phlebitic syndrome  F/u with vein doctor (vs vascular) if normal ultrasound for further evlauation of the pain.  Quit smoking encouraged--counseled, risks reviewed (esp with relation to PAD)  (restarted after her grandma died).

## 2016-05-31 ENCOUNTER — Ambulatory Visit
Admission: RE | Admit: 2016-05-31 | Discharge: 2016-05-31 | Disposition: A | Discharge status: Home / Self Care | Payer: BLUE CROSS/BLUE SHIELD | Source: Ambulatory Visit | Attending: Family Medicine | Admitting: Family Medicine

## 2016-05-31 DIAGNOSIS — M79662 Pain in left lower leg: Secondary | ICD-10-CM

## 2016-05-31 DIAGNOSIS — Z86718 Personal history of other venous thrombosis and embolism: Secondary | ICD-10-CM

## 2016-07-25 ENCOUNTER — Telehealth: Payer: Self-pay | Admitting: Internal Medicine

## 2016-07-25 NOTE — Telephone Encounter (Signed)
Called and spoke to patient about who diagnosed her with MS. Pt states it was back in 2002 and she doesn't know who diagnosed her with it. She has been dealing with it. She doesn't mention it to anyone cause she doesn't want anyone to say its coming from your MS as she wants to know whats wrong with her then. [name] NP-C is aware

## 2016-10-14 ENCOUNTER — Encounter: Payer: Self-pay | Admitting: Neurology

## 2016-10-14 ENCOUNTER — Ambulatory Visit (INDEPENDENT_AMBULATORY_CARE_PROVIDER_SITE_OTHER): Payer: BLUE CROSS/BLUE SHIELD | Admitting: Neurology

## 2016-10-14 VITALS — BP 125/85 | HR 85 | Ht 69.0 in | Wt 210.5 lb

## 2016-10-14 DIAGNOSIS — I670 Dissection of cerebral arteries, nonruptured: Secondary | ICD-10-CM | POA: Diagnosis not present

## 2016-10-14 DIAGNOSIS — G453 Amaurosis fugax: Secondary | ICD-10-CM

## 2016-10-14 DIAGNOSIS — H53132 Sudden visual loss, left eye: Secondary | ICD-10-CM | POA: Diagnosis not present

## 2016-10-14 DIAGNOSIS — I679 Cerebrovascular disease, unspecified: Secondary | ICD-10-CM

## 2016-10-14 NOTE — Patient Instructions (Addendum)
Remember to drink plenty of fluid, eat healthy meals and do not skip any meals. Try to eat protein with a every meal and eat a healthy snack such as fruit or nuts in between meals. Try to keep a regular sleep-wake schedule and try to exercise daily, particularly in the form of walking, 20-30 minutes a day, if you can.   As far as diagnostic testing: MRI brain and blood vessels of the neck,  labs, visual evoked potentials  I would like to see you back in 8 weeks, sooner if we need to. Please call us with any interim questions, concerns, problems, updates or refill requests.   Our phone number is [phone]. We also have an after hours call service for urgent matters and there is a physician on-call for urgent questions. For any emergencies you know to call 911 or go to the nearest emergency room

## 2016-10-14 NOTE — Progress Notes (Addendum)
[facility]    Provider:  Dr Jaynee Eagles Referring Provider: [facility] Primary Care Physician:  Harland Dingwall, NP  CC:  Blurred vision evaluate for optic neuritis   Addendum 01/10/2017: Evaluation by neuro ophthalmologist Dr. Jolyn Nap does not show any optic nerve or CNS dysfunction for patient's vision loss. His examination showed that her current vision is completely compatible with degree of impressive opalescent nuclear sclerotic cataract. He recommends cataract removal. No further workup in neurology at this time and patient can return to primary care.   HPI:  Rhonda Hudson is a 61 y.o. female here as a referral from Dr. Raenette Rover for blurred vision. She has a PMHx of migraines and was told 20 years ago she had multiple Sclerosis. 2 months ago(November) she was work and at [employer] all of a sudden acutely she couldn't see from her left eye, it was immediate just in the left eye. She saw floaters. It was darker, could make out shapes but significantly decreased vision. For a few days she saw "lightning bolts" on the sides of her eye. Vision has never improved since them. She is having more headaches. She is falling more than she used to. Vision changes just in the left eye. The vision changes have not improved or worsened, the same as when she had acute change. Her vision does not meet the requirements for her job. She saw an ophthalmologist who could not find an issue, thought possibly retinal separation but otherwise nothing wrong with her eyes and glasses were not an option and she was sent here. She is at risk for losing her job. 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 She doesn't want to lose her job, she needs a diagnosis by Friday. I advised her to get me an FMLA form or a short-term disability. She also has numbness in her hands. No facial drooping or dysphagia or other cranial nerve abornlaities. She feels her legs are fatigued  after 8 hours of work. No ptosis. She feels her visual perception is off and at night driving is difficult and appears to look like double vision. No pain on eye movement. She sees color less. She also has depression since her dog passed away. Headache today is frontal area and she has weekly headaches. She says 20 years ago she was told she had MS. No other focal neurologic deficits, associated symptoms, inciting events or modifiable factors.   Reviewed notes, labs and imaging from outside physicians, which showed:   A1c 6.4, glucose 112, BUN 8, creatinine 0.71, TSH 4.6 01/04/2016  Reviewed records from ophthalmology. She presented with decreased vision and flashes OS. She was a very poor historian. Upon further questioning she said she may have MS.On dilated eye exam she was found to have 1+ cataracts and the question of optic neuritis was raised however the fundus showed no signs of optic nerve swelling. She is returning for visual field testing. Patient canceled her appointment and did not return for the testing. She called them back and asked them to refer her to neurology and Dr. Delman Cheadle did.  OD 20/40. OS 20/200. No signs of optic nerve swelling.    Review of Systems: Patient complains of symptoms per HPI as well as the following symptoms: Fatigue, blurred vision, loss of vision, headache, numbness, weakness. Pertinent negatives per HPI. All others negative.   Social History   Social History  . Marital status: Divorced    Spouse name:  N/A  . Number of children: 2  . Years of education: N/A   Occupational History  . fabric factory    Social History Main Topics  . Smoking status: Current Some Day Smoker    Packs/day: 0.50    Types: Cigarettes  . Smokeless tobacco: Never Used  . Alcohol use No  . Drug use: No  . Sexual activity: Not on file   Other Topics Concern  . Not on file   Social History Narrative   Lives with two sons   Caffeine use: coffee and coke daily    Family  History  Problem Relation Age of Onset  . Multiple sclerosis Neg Hx   . Autoimmune disease Neg Hx     Past Medical History:  Diagnosis Date  . Diverticulitis   . Hiatal hernia with gastroesophageal reflux    disease  . Insomnia   . Irritable bowel syndrome   . Multiple sclerosis ([place])   . Panic attacks    Hx of    Past Surgical History:  Procedure Laterality Date  . CHOLECYSTECTOMY    . TUBAL LIGATION    . US ECHOCARDIOGRAPHY  01-31-2009   EF 60%    No current outpatient prescriptions on file.   No current facility-administered medications for this visit.     Allergies as of 10/14/2016  . (No Known Allergies)    Vitals: BP 125/85 (BP Location: Right Arm, Patient Position: Sitting, Cuff Size: Normal)   Pulse 85   Ht 5' 9" (1.753 m)   Wt 210 lb 8 oz (95.5 kg)   SpO2 97%   BMI 31.09 kg/m  Last Weight:  Wt Readings from Last 1 Encounters:  10/14/16 210 lb 8 oz (95.5 kg)   Last Height:   Ht Readings from Last 1 Encounters:  10/14/16 5' 9" (1.753 m)    Physical exam: Exam: Gen: NAD, conversant, well nourised, obese, well groomed                     CV: RRR, no MRG. No Carotid Bruits. No peripheral edema, warm, nontender Eyes: Conjunctivae clear without exudates or hemorrhage  Neuro: Detailed Neurologic Exam  Speech:    Speech is normal; fluent and spontaneous with normal comprehension.  Cognition:    The patient is oriented to person, place, and time;     recent and remote memory intact;     language fluent;     normal attention, concentration,     fund of knowledge Cranial Nerves:    The pupils are equal, round, and reactive to light. No APD. The fundi are without edema. Visual fields are full to finger confrontation. Extraocular movements are intact. Trigeminal sensation is intact and the muscles of mastication are normal. The face is symmetric. The palate elevates in the midline. Hearing intact. Voice is normal. Shoulder shrug is normal. The tongue  has normal motion without fasciculations.   Coordination:    Normal finger to nose and heel to shin. Normal rapid alternating movements.   Gait:    Heel-toe and tandem gait are normal.   Motor Observation:    No asymmetry, no atrophy, and no involuntary movements noted. Tone:    Normal muscle tone.    Posture:    Posture is normal. normal erect    Strength: Bilateral lower extremity weakness.      Strength is V/V in the upper and lower limbs.      Sensation: intact to LT  Reflex Exam:  DTR's:    Deep tendon reflexes in the upper and lower extremities are normal bilaterally.   Toes:    The toes are downgoing bilaterally.   Clonus:    Clonus is absent.      Assessment/Plan:  61 year old with acute vision loss in the left eye, headaches.Referral for possible optic neuritis. She says she was diagnosed with MS in the past but she is a poor historian.   - MRI brain w/wo contrast and MRA of the head and neck to evaluate for dissection and stroke, MS or optic neuritis - labs including esr and crp - visual evoked potentials - Start steroids if esr/crp elevated - She says she is going to lose her job this Friday due to this issue. I told her she could me short-term disability forms or FMLA and that she needs to talk to her HR for these forms. I cannot promise we can get the forms done by Friday unless she gets them to me tomorrow as these forms are often detailed. I will write her a letter in the meantime. - If vision worsens or she has new symptoms she is to proceed to the ED for imaging and treatment   Addendum 01/10/2017: Evaluation by neuro ophthalmologist Dr. Jolyn Nap does not show any optic nerve or CNS dysfunction for patient's vision loss. His examination showed that her current vision is completely compatible with degree of impressive opalescent nuclear sclerotic cataract. He recommends cataract removal. No further workup in neurology at this time and patient can  return to primary care.  Cc: Dr. Kennyth Lose, [facility] [address] [place], [place] [address]  Phone [phone] Fax [phone]

## 2016-10-15 ENCOUNTER — Ambulatory Visit (INDEPENDENT_AMBULATORY_CARE_PROVIDER_SITE_OTHER): Payer: BLUE CROSS/BLUE SHIELD

## 2016-10-15 ENCOUNTER — Telehealth: Payer: Self-pay | Admitting: *Deleted

## 2016-10-15 ENCOUNTER — Telehealth: Payer: Self-pay | Admitting: Neurology

## 2016-10-15 DIAGNOSIS — H53132 Sudden visual loss, left eye: Secondary | ICD-10-CM | POA: Diagnosis not present

## 2016-10-15 LAB — COMPREHENSIVE METABOLIC PANEL
ALK PHOS: 83 IU/L (ref 39–117)
ALT: 15 IU/L (ref 0–32)
AST: 13 IU/L (ref 0–40)
Albumin/Globulin Ratio: 1.6 (ref 1.2–2.2)
Albumin: 4.4 g/dL (ref 3.6–4.8)
BUN/Creatinine Ratio: 13 (ref 12–28)
BUN: 11 mg/dL (ref 8–27)
Bilirubin Total: 0.5 mg/dL (ref 0.0–1.2)
CALCIUM: 9.9 mg/dL (ref 8.7–10.3)
CO2: 25 mmol/L (ref 18–29)
Chloride: 99 mmol/L (ref 96–106)
Creatinine, Ser: 0.82 mg/dL (ref 0.57–1.00)
GFR calc Af Amer: 89 mL/min/{1.73_m2} (ref >59)
GFR, EST NON AFRICAN AMERICAN: 77 mL/min/{1.73_m2} (ref >59)
GLOBULIN, TOTAL: 2.7 g/dL (ref 1.5–4.5)
GLUCOSE: 121 mg/dL — AB (ref 65–99)
Potassium: 4.6 mmol/L (ref 3.5–5.2)
Sodium: 138 mmol/L (ref 134–144)
Total Protein: 7.1 g/dL (ref 6.0–8.5)

## 2016-10-15 LAB — LIPID PANEL
Chol/HDL Ratio: 4.7 ratio units — ABNORMAL HIGH (ref 0.0–4.4)
Cholesterol, Total: 232 mg/dL — ABNORMAL HIGH (ref 100–199)
HDL: 49 mg/dL (ref >39)
LDL Calculated: 156 mg/dL — ABNORMAL HIGH (ref 0–99)
Triglycerides: 136 mg/dL (ref 0–149)
VLDL Cholesterol Cal: 27 mg/dL (ref 5–40)

## 2016-10-15 LAB — CBC
HEMATOCRIT: 45.9 % (ref 34.0–46.6)
HEMOGLOBIN: 15.6 g/dL (ref 11.1–15.9)
MCH: 30.1 pg (ref 26.6–33.0)
MCHC: 34 g/dL (ref 31.5–35.7)
MCV: 88 fL (ref 79–97)
PLATELETS: 233 10*3/uL (ref 150–379)
RBC: 5.19 x10E6/uL (ref 3.77–5.28)
RDW: 14.1 % (ref 12.3–15.4)
WBC: 9.2 10*3/uL (ref 3.4–10.8)

## 2016-10-15 LAB — C-REACTIVE PROTEIN: CRP: 9.8 mg/L — ABNORMAL HIGH (ref 0.0–4.9)

## 2016-10-15 LAB — HEMOGLOBIN A1C
Est. average glucose Bld gHb Est-mCnc: 131 mg/dL
Hgb A1c MFr Bld: 6.2 % — ABNORMAL HIGH (ref 4.8–5.6)

## 2016-10-15 LAB — SEDIMENTATION RATE: Sed Rate: 21 mm/hr (ref 0–40)

## 2016-10-15 NOTE — Telephone Encounter (Signed)
Called and LVM for patient. Asked her to call back and let me know if she wants letter mailed or if she would like to pick it up.

## 2016-10-15 NOTE — Procedures (Signed)
    History:   Rhonda Hudson is a 61 year old patient with a history of relative sudden visual loss involving the left eye that occurred in November 2017. This occurred suddenly while at work. The vision has not improved to normal since that time. The patient is being evaluated for this visual loss.  Description: The visual evoked response test was performed today using 32 x 32 check sizes. The absolute latencies for the N1 and the P100 wave forms were within normal limits bilaterally, but the P100 latency on the left is significantly more prolonged than that on the right. The amplitudes for the P100 wave forms were also within normal limits bilaterally. The visual acuity was 20/40 OD and 20/200 OS corrected.  Impression:  The visual evoked response test above was abnormal. The P100 waveform latencies were within normal limits bilaterally, but there is a relative prolongation on the left suggesting a mild dysfunction of the anterior visual pathways on the left. This study would not suggest a severe visual impairment involving the left eye. Clinical correlation is required.

## 2016-10-15 NOTE — Telephone Encounter (Signed)
Charles[employer] [phone] called, he needs disability info for the patient

## 2016-10-15 NOTE — Telephone Encounter (Signed)
The MRI Brain met criteria but the MRA neck & MRA head did not. They wouldn't release a authorization number to me due to they are all linked together. The phone number for the peer to peer is [phone], the member ID is [id number] & DOB is [id number]. Please do in 2 business days. Thank you for your help.

## 2016-10-15 NOTE — Telephone Encounter (Signed)
LVM returning call from [place]. Asked him to call back. Gave [employer] phone number

## 2016-10-15 NOTE — Telephone Encounter (Signed)
Called and spoke to patient. She would like letter placed up front. She will come today to pick up at front desk. Placed letter up front for pick up.  She said she spoke with [employer] yesterday and gave them our fax and my name for them to send disability paperwork. I advised they are calling today wanting information. Advised I will call back to see what they need. She verbalized understanding.

## 2016-10-16 NOTE — Telephone Encounter (Signed)
[id number] they approved all the studies, asked multiple times valid jan 30 - feb 28th [facility]

## 2016-10-17 ENCOUNTER — Telehealth: Payer: Self-pay | Admitting: *Deleted

## 2016-10-17 ENCOUNTER — Encounter: Payer: Self-pay | Admitting: *Deleted

## 2016-10-17 NOTE — Telephone Encounter (Signed)
Patient called office back. She is wanting to know if Dr Jaynee Eagles can write her a letter stating she is safe to go back to work. I placed patient on hold and spoke with AA,MD. Dr Jaynee Eagles stated she can write a generic.   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 She is not on any machinery or operating it. She has a hard time seeing the dark yarn and states she is losing sensation in her hands and sometimes she cannot feel if yarn is in her hands or not.  She stated she will be in [place] area today around 230pm and she would like to pick up letter then if possible. Advised I will send message to AA,MD to write. She verbalized understanding.

## 2016-10-17 NOTE — Telephone Encounter (Signed)
Placed letter up front for pick up. 

## 2016-10-17 NOTE — Telephone Encounter (Signed)
Called and LVM for [place] again. Advised I LVM on 10/15/16 as well. Have not heard back. Gave [employer] phone number, pt name/DOB and asked for a return call.

## 2016-10-17 NOTE — Telephone Encounter (Signed)
Noted, thank you for your help! She is scheduled on our [place] mobile unit for 10/30/16

## 2016-10-17 NOTE — Telephone Encounter (Signed)
-----   Message from Melvenia Beam, MD sent at 10/17/2016 10:15 AM EST ----- Her cholesterol is elevated her LDL is 156 and should be < 99. I recommend diet and lifestyle changes. Could also start a statin for her. We can wait until the results of the MRIs and if we find any evidence of stroke I highly recommend a statin such as lipitor. Please forward these results to pcp thanks

## 2016-10-17 NOTE — Telephone Encounter (Signed)
Called and spoke to patient about lab results and visual evoked potential test results per AA,MD note. She verbalized understanding.  She would like to try diet and exercise first before starting a statin. Advised if Dr Jaynee Eagles does find evidence of stroke on MRI, she highly recommends her starting a statin at that point. She verbalized understanding.  Verified she still follows with Harland Dingwall, NP for primary care. Advised I will forward results to her so they have a copy. Sent VIA epic.

## 2016-10-18 ENCOUNTER — Telehealth: Payer: Self-pay | Admitting: Neurology

## 2016-10-18 NOTE — Telephone Encounter (Signed)
Pt says she took the letter to work and they will not let her return to work with so many restrictions. She said [employer] has sent short term disability forms. Pt said she is devastated she can not return to work.

## 2016-10-18 NOTE — Telephone Encounter (Signed)
Called and spoke to pt. When patient answered the phone she stated "Im depressed". I asked patient to clarify what she was depressed about. She stated that her work would not accept the letter that Dr Jaynee Eagles wrote. She needs disability paperwork filled out. She stated they have said they have already faxed. Advised we have not received anything. Asked her to call them back and fax to both [phone] and [phone]. Asked her to ask for an extension to have paperwork filled out so that gives [place] time to fill out her forms. She verbalized understanding.  Advised if I do not see it Monday, I will call her back

## 2016-10-21 NOTE — Telephone Encounter (Signed)
Pt will call back and schedule something after she has all the test done

## 2016-10-21 NOTE — Telephone Encounter (Signed)
She has not been in a while. I recommend she come in for a medcheck or CPE after she has her MRI and follows up with the neurologist.

## 2016-10-21 NOTE — Telephone Encounter (Signed)
Anderson Malta- can you keep an eye out for this today? I told her we would call today if we do not see it by today

## 2016-10-21 NOTE — Telephone Encounter (Signed)
Talked to pt. Says that she spoke to [employer] who says that they faxed FMLA paperwork to [place] last Tues and Fri and again today. We have not yet gotten those forms. Pt agreed to call back tomorrow morning w/ contact information so that I can follow-up on faxes that have not yet been received.

## 2016-10-22 NOTE — Telephone Encounter (Signed)
Patient calling back with [employer] telephone number regarding FMLA paperwork. [employer] telephone  number is [phone].

## 2016-10-23 NOTE — Telephone Encounter (Signed)
Called [employer] and they have no record of patient initiating a claim.

## 2016-10-23 NOTE — Telephone Encounter (Signed)
Returned pt TC. She says that she's getting a daily call from [employer] stating that they have not gotten paperwork returned from her dr's office. Let pt know that I called [employer] this morning and was told that they have no record of a claim for her. Suggested that she try to log in to [email or web address] to file a claim and/or print FMLA paperwork from there. Said she doesn't have internet access at home but will try at work tomorrow.

## 2016-10-23 NOTE — Telephone Encounter (Signed)
Pt returned RN's call °

## 2016-10-24 NOTE — Telephone Encounter (Signed)
Patient calling back to advise [employer] faxed paperwork to our office today at 9:34am. A returned call is not needed.

## 2016-10-30 ENCOUNTER — Ambulatory Visit (INDEPENDENT_AMBULATORY_CARE_PROVIDER_SITE_OTHER): Payer: BLUE CROSS/BLUE SHIELD

## 2016-10-30 DIAGNOSIS — I679 Cerebrovascular disease, unspecified: Secondary | ICD-10-CM | POA: Diagnosis not present

## 2016-10-30 DIAGNOSIS — I670 Dissection of cerebral arteries, nonruptured: Secondary | ICD-10-CM

## 2016-10-30 DIAGNOSIS — G453 Amaurosis fugax: Secondary | ICD-10-CM | POA: Diagnosis not present

## 2016-10-30 DIAGNOSIS — H53132 Sudden visual loss, left eye: Secondary | ICD-10-CM

## 2016-10-30 MED ORDER — GADOPENTETATE DIMEGLUMINE 469.01 MG/ML IV SOLN: 20.0000 mL | Freq: Once | INTRAVENOUS | Status: DC | PRN

## 2016-11-04 ENCOUNTER — Telehealth: Payer: Self-pay | Admitting: *Deleted

## 2016-11-04 NOTE — Telephone Encounter (Signed)
Per Dr Rhonda Hudson, spoke with patient and informed her that her MRI brain and MRA head/neck results were all unremarkable. Nothing is consistent with MS, and there is no reason found for her vision loss. Patient stated she does not know where to go from here. She stated her job told her she could return if she got glasses to correct her vision back to 20/30. However she stated she saw an optometrist who told her he didn't know what type correction she needed in glasses, and the opthalmologist told her "there was nothing wrong with her eyes".  She stated her left eye vision is no better; she continues to have constant blurred vision, cannot see details in her left eye, only silhouettes. She would like to know what else she can do about her vision. Advised would route to Dr Rhonda Hudson for further information. She verbalized understanding, appreciation.

## 2016-11-08 ENCOUNTER — Telehealth: Payer: Self-pay

## 2016-11-08 DIAGNOSIS — H53132 Sudden visual loss, left eye: Secondary | ICD-10-CM

## 2016-11-08 NOTE — Telephone Encounter (Signed)
-----   Message from Melvenia Beam, MD sent at 11/07/2016  5:56 PM EST ----- I would perform a lumbar puncture to get some fluid from her back and test it. Please let me know if patient is willing thanks

## 2016-11-08 NOTE — Telephone Encounter (Signed)
Called pt and she says that she would be willing to undergo lumbar puncture. Is currently working w/ her eye doctor on new lenses for her glasses and waiting to hear from occupational nurse if that would improve her vision enough to return to work.

## 2016-11-20 ENCOUNTER — Other Ambulatory Visit: Payer: Self-pay | Admitting: Neurology

## 2016-11-20 DIAGNOSIS — H469 Unspecified optic neuritis: Secondary | ICD-10-CM

## 2016-11-25 ENCOUNTER — Telehealth: Payer: Self-pay

## 2016-11-25 NOTE — Telephone Encounter (Signed)
[employer] paperwork completed last week, awaiting MD review/signature.

## 2016-11-27 ENCOUNTER — Telehealth: Payer: Self-pay

## 2016-11-27 ENCOUNTER — Ambulatory Visit
Admission: RE | Admit: 2016-11-27 | Discharge: 2016-11-27 | Disposition: A | Discharge status: Home / Self Care | Payer: BLUE CROSS/BLUE SHIELD | Source: Ambulatory Visit | Attending: Neurology | Admitting: Neurology

## 2016-11-27 VITALS — BP 130/72 | HR 68

## 2016-11-27 DIAGNOSIS — H469 Unspecified optic neuritis: Secondary | ICD-10-CM

## 2016-11-27 DIAGNOSIS — H53132 Sudden visual loss, left eye: Secondary | ICD-10-CM

## 2016-11-27 LAB — CSF CELL COUNT WITH DIFFERENTIAL
RBC COUNT CSF: 29 {cells}/uL — AB (ref 0–10)
WBC, CSF: 0 cells/uL (ref 0–5)

## 2016-11-27 LAB — PROTEIN, CSF: Total Protein, CSF: 42 mg/dL (ref 15–60)

## 2016-11-27 LAB — GLUCOSE, CSF: Glucose, CSF: 80 mg/dL — ABNORMAL HIGH (ref 43–76)

## 2016-11-27 NOTE — Discharge Instructions (Signed)

## 2016-11-27 NOTE — Telephone Encounter (Signed)
[facility] lab called w/ stat CSF results. Reports that no WBCs seen and no organisms seen.

## 2016-11-27 NOTE — Progress Notes (Signed)
One SST tube of blood drawn from left hand for LP labs; site unremarkable; by Cristal Ford, RN

## 2016-11-29 ENCOUNTER — Telehealth: Payer: Self-pay

## 2016-11-29 LAB — BORRELIA SPECIES DNA, FLUID, PCR: B. burgdorferi DNA: NOT DETECTED

## 2016-11-29 NOTE — Telephone Encounter (Signed)
Spoke with patient to see how she is doing after her LP here 11/27/16.  She says she is good and has not had a headache.  jkl

## 2016-11-29 NOTE — Telephone Encounter (Signed)
Received faxed CSF results, given to MD for review.

## 2016-11-30 LAB — VDRL, CSF: VDRL Quant, CSF: NONREACTIVE

## 2016-12-01 LAB — CSF CULTURE: GRAM STAIN: NONE SEEN

## 2016-12-01 LAB — CSF CULTURE W GRAM STAIN: Organism ID, Bacteria: NO GROWTH

## 2016-12-01 LAB — CNS IGG SYNTHESIS RATE, CSF+BLOOD
ALBUMIN, SERUM(NEPH): 4.2 g/dL (ref 3.2–4.6)
Albumin, CSF: 20 mg/dL (ref 8.0–42.0)
IgG Index, CSF: 0.46 (ref <0.66)
IgG, CSF: 2.1 mg/dL (ref 0.8–7.7)
IgG, Serum: 963 mg/dL (ref 694–1618)
MS CNS IGG SYNTHESIS RATE: -3.4 mg/(24.h) (ref <=3.3)

## 2016-12-03 LAB — OLIGOCLONAL BANDS, CSF + SERM

## 2016-12-05 ENCOUNTER — Telehealth: Payer: Self-pay

## 2016-12-05 NOTE — Telephone Encounter (Signed)
-----   Message from Melvenia Beam, MD sent at 12/05/2016  9:34 AM EDT ----- Labs unremarkable no signs of MS or infection

## 2016-12-05 NOTE — Telephone Encounter (Signed)
Called pt w/ unremarkable results. Verbalized understanding and appreciation for call.

## 2016-12-09 ENCOUNTER — Ambulatory Visit (INDEPENDENT_AMBULATORY_CARE_PROVIDER_SITE_OTHER): Payer: BLUE CROSS/BLUE SHIELD | Admitting: Neurology

## 2016-12-09 ENCOUNTER — Encounter: Payer: Self-pay | Admitting: Neurology

## 2016-12-09 VITALS — BP 140/90 | HR 72 | Ht 69.0 in | Wt 210.0 lb

## 2016-12-09 DIAGNOSIS — H469 Unspecified optic neuritis: Secondary | ICD-10-CM

## 2016-12-09 DIAGNOSIS — H53132 Sudden visual loss, left eye: Secondary | ICD-10-CM | POA: Diagnosis not present

## 2016-12-09 DIAGNOSIS — H269 Unspecified cataract: Secondary | ICD-10-CM

## 2016-12-09 NOTE — Telephone Encounter (Signed)
Late entry - Dr. Jaynee Eagles declined disability for patient as all test results have come back negative.
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
## 2016-12-10 ENCOUNTER — Telehealth: Payer: Self-pay

## 2016-12-10 NOTE — Telephone Encounter (Signed)
-----   Message from Melvenia Beam, MD sent at 12/05/2016  9:31 PM EDT ----- Delsa Sale, I would like to refer patient to a specialist, a neuro ophthalmologist if she is willing. Dr. Jolyn Nap at [facility]. thanks

## 2016-12-10 NOTE — Telephone Encounter (Signed)
Referral to Dr. Hassell Done discussed w/ pt at [place] yesterday.

## 2016-12-12 LAB — QUANTIFERON IN TUBE
QFT TB AG MINUS NIL VALUE: 0.45 [IU]/mL
QUANTIFERON MITOGEN VALUE: 6.15 IU/mL
QUANTIFERON NIL VALUE: 0.02 [IU]/mL
QUANTIFERON TB AG VALUE: 0.47 [IU]/mL
QUANTIFERON TB GOLD: POSITIVE — AB

## 2016-12-12 LAB — QUANTIFERON TB GOLD ASSAY (BLOOD)

## 2016-12-18 ENCOUNTER — Ambulatory Visit (HOSPITAL_COMMUNITY)
Admission: RE | Admit: 2016-12-18 | Discharge: 2016-12-18 | Disposition: A | Discharge status: Home / Self Care | Payer: BLUE CROSS/BLUE SHIELD | Source: Ambulatory Visit | Attending: Neurology | Admitting: Neurology

## 2016-12-18 ENCOUNTER — Other Ambulatory Visit: Payer: Self-pay | Admitting: Neurology

## 2016-12-18 ENCOUNTER — Encounter (HOSPITAL_COMMUNITY): Payer: Self-pay

## 2016-12-18 ENCOUNTER — Telehealth: Payer: Self-pay

## 2016-12-18 DIAGNOSIS — R7612 Nonspecific reaction to cell mediated immunity measurement of gamma interferon antigen response without active tuberculosis: Secondary | ICD-10-CM

## 2016-12-18 LAB — METHYLMALONIC ACID, SERUM: Methylmalonic Acid: 139 nmol/L (ref 0–378)

## 2016-12-18 LAB — RPR: RPR Ser Ql: NONREACTIVE

## 2016-12-18 LAB — SEDIMENTATION RATE: Sed Rate: 9 mm/hr (ref 0–40)

## 2016-12-18 LAB — PAN-ANCA
ANCA Proteinase 3: <3.5 U/mL (ref 0.0–3.5)
Atypical pANCA: <1:20 {titer}
Myeloperoxidase Ab: <9 U/mL (ref 0.0–9.0)

## 2016-12-18 LAB — B. BURGDORFI ANTIBODIES

## 2016-12-18 LAB — CRYOGLOBULIN

## 2016-12-18 LAB — C-REACTIVE PROTEIN: CRP: 6.5 mg/L — ABNORMAL HIGH (ref 0.0–4.9)

## 2016-12-18 LAB — VITAMIN B1: Thiamine: 97 nmol/L (ref 66.5–200.0)

## 2016-12-18 LAB — ANGIOTENSIN CONVERTING ENZYME: ANGIO CONVERT ENZYME: 29 U/L (ref 14–82)

## 2016-12-18 LAB — B12 AND FOLATE PANEL
Folate: 7.2 ng/mL (ref >3.0)
Vitamin B-12: 257 pg/mL (ref 232–1245)

## 2016-12-18 LAB — NEUROMYELITIS OPTICA AUTOAB, IGG

## 2016-12-18 NOTE — Telephone Encounter (Signed)
Called pt w/ lab results and recommendation to go to [facility] T # [phone] to have chest x-ray. No answer, left VM mssg requesting that she call back to discuss further.

## 2016-12-18 NOTE — Telephone Encounter (Signed)
Patient is returning your call.  

## 2016-12-18 NOTE — Telephone Encounter (Signed)
Talked to patient and reviewed lab results w/ her as well as instructions on having CXR at [facility]. Verbalized understanding and appreciation for call.

## 2016-12-18 NOTE — Telephone Encounter (Signed)
Pt returned RN's call °

## 2016-12-18 NOTE — Telephone Encounter (Signed)
Returned pt's call. Advised to call [facility] for CXR d/t positive TB results. May call back w/ any questions.

## 2016-12-18 NOTE — Telephone Encounter (Signed)
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 Has she ever been exposed to anyone with TB or travelled to a foreign country? thanks

## 2016-12-19 NOTE — Telephone Encounter (Signed)
Patient called office in reference to [facility] unable to do a 4 view CXR, please call as to where patient may be able to have completed.

## 2016-12-19 NOTE — Telephone Encounter (Signed)
Called and spoke to patient and relayed  she would have to go to [place] . Relayed to Patient she could walk in Monday - Friday 8:30 - 4:30. Patient  Was fine with this and understood all details.

## 2016-12-20 ENCOUNTER — Ambulatory Visit
Admission: RE | Admit: 2016-12-20 | Discharge: 2016-12-20 | Disposition: A | Discharge status: Home / Self Care | Payer: BLUE CROSS/BLUE SHIELD | Source: Ambulatory Visit | Attending: Neurology | Admitting: Neurology

## 2016-12-20 ENCOUNTER — Other Ambulatory Visit: Payer: Self-pay | Admitting: Neurology

## 2016-12-20 DIAGNOSIS — R7612 Nonspecific reaction to cell mediated immunity measurement of gamma interferon antigen response without active tuberculosis: Secondary | ICD-10-CM

## 2016-12-20 DIAGNOSIS — R7611 Nonspecific reaction to tuberculin skin test without active tuberculosis: Secondary | ICD-10-CM

## 2016-12-24 ENCOUNTER — Telehealth: Payer: Self-pay | Admitting: *Deleted

## 2016-12-24 NOTE — Telephone Encounter (Signed)
Per Dr Jaynee Eagles, spoke with patient and informed her that her chest xray was normal. Patient verbalized understanding. She then stated she had a question for Dr Jaynee Eagles. She stated she had gall bladder surgery in 2010 and was told she had a cyst on her left kidney. She stated she has been having abdominal pain recently. This RN advised she call her PCP about this new pain. Patient verbalized understanding, agreement.

## 2017-01-10 ENCOUNTER — Telehealth: Payer: Self-pay | Admitting: Neurology

## 2017-01-10 NOTE — Telephone Encounter (Signed)
Evaluation by neuro ophthalmologist Dr. Jolyn Nap does not show any optic nerve or CNS dysfunction for patient's vision loss. His examination showed that her current vision is completely compatible with degree of impressive opalescent nuclear sclerotic cataract. He recommends cataract removal. No further workup in neurology at this time and patient can return to primary care.  Delsa Sale, please fax Dr. Earlie Server notes  to pcp and referring provider (please include note above) and scan into chart thanks. No follow up needed.

## 2017-01-12 ENCOUNTER — Emergency Department (HOSPITAL_COMMUNITY)
Admission: EM | Admit: 2017-01-12 | Discharge: 2017-01-12 | Disposition: A | Discharge status: Home / Self Care | Payer: BLUE CROSS/BLUE SHIELD | Attending: Emergency Medicine | Admitting: Emergency Medicine

## 2017-01-12 ENCOUNTER — Encounter (HOSPITAL_COMMUNITY): Payer: Self-pay | Admitting: Emergency Medicine

## 2017-01-12 DIAGNOSIS — M79605 Pain in left leg: Secondary | ICD-10-CM | POA: Diagnosis not present

## 2017-01-12 DIAGNOSIS — M7989 Other specified soft tissue disorders: Secondary | ICD-10-CM | POA: Insufficient documentation

## 2017-01-12 DIAGNOSIS — F17210 Nicotine dependence, cigarettes, uncomplicated: Secondary | ICD-10-CM | POA: Diagnosis not present

## 2017-01-12 DIAGNOSIS — Z791 Long term (current) use of non-steroidal anti-inflammatories (NSAID): Secondary | ICD-10-CM | POA: Diagnosis not present

## 2017-01-12 MED ORDER — OXYCODONE-ACETAMINOPHEN 5-325 MG PO TABS
1.0000 | ORAL_TABLET | Freq: Once | ORAL | Status: AC
  Administered 2017-01-12: 1 via ORAL
  Filled 2017-01-12: qty 1

## 2017-01-12 MED ORDER — ENOXAPARIN SODIUM 80 MG/0.8ML ~~LOC~~ SOLN
1.0000 mg/kg | Freq: Once | SUBCUTANEOUS | Status: AC
  Administered 2017-01-12: 20:00:00 75 mg via SUBCUTANEOUS
  Filled 2017-01-12: qty 0.8

## 2017-01-12 MED ORDER — IBUPROFEN 400 MG PO TABS
400.0000 mg | ORAL_TABLET | Freq: Once | ORAL | Status: AC
  Administered 2017-01-12: 400 mg via ORAL
  Filled 2017-01-12: qty 1

## 2017-01-12 NOTE — ED Triage Notes (Signed)
Pain to rt calf since yesterday.  Had blood clot in same leg x 1 year ago, states it feels the same this time.  Not currently on blood thinners.

## 2017-01-12 NOTE — Discharge Instructions (Signed)
Return tomorrow for [place]

## 2017-01-12 NOTE — ED Notes (Signed)
Pt ambulatory to waiting room. Pt verbalized understanding of discharge instructions.   

## 2017-01-12 NOTE — ED Provider Notes (Signed)
[facility] DEPT Provider Note   CSN: [id number] Arrival date & time: 01/12/17  1935   By signing my name below, I, Hilbert Odor, attest that this documentation has been prepared under the direction and in the presence of Virgel Manifold, MD. Electronically Signed: Hilbert Odor, Scribe. 01/12/17. 8:16 PM. History   Chief Complaint Chief Complaint  Patient presents with  . Leg Pain    The history is provided by the patient. No language interpreter was used.  HPI Comments: Rhonda Hudson is a 61 y.o. female who presents to the Emergency Department complaining of left calf pain since early yesterday. She reports current pain between her left ankle and knee. She rates the pain 10/10. She also reports some numbness in her left foot. She denies any recent injuries. She states that she had a blood clot around a year ago and had similar pain at that time. She was given Xarelto for 3 weeks at that time for her clot with significant improvement. She is not currently on any blood thinners. She denies fevers or SOB recently.  Past Medical History:  Diagnosis Date  . Diverticulitis   . Hiatal hernia with gastroesophageal reflux    disease  . Insomnia   . Irritable bowel syndrome   . Multiple sclerosis ([place])   . Panic attacks    Hx of    Patient Active Problem List   Diagnosis Date Noted  . Acute loss of vision, left 10/14/2016  . History of hypothyroidism 01/04/2016  . DVT (deep venous thrombosis), left 01/04/2016    Past Surgical History:  Procedure Laterality Date  . CHOLECYSTECTOMY    . TUBAL LIGATION    . US ECHOCARDIOGRAPHY  01-31-2009   EF 60%    OB History    No data available       Home Medications    Prior to Admission medications   Medication Sig Start Date End Date Taking? Authorizing Provider  ibuprofen (ADVIL,MOTRIN) 200 MG tablet Take 800 mg by mouth every 6 (six) hours as needed for headache, mild pain or moderate pain.    Yes Historical Provider,  MD    Family History Family History  Problem Relation Age of Onset  . Multiple sclerosis Neg Hx   . Autoimmune disease Neg Hx     Social History Social History  Substance Use Topics  . Smoking status: Current Some Day Smoker    Packs/day: 0.50    Types: Cigarettes  . Smokeless tobacco: Never Used  . Alcohol use No     Allergies   Patient has no known allergies.   Review of Systems Review of Systems  All other systems reviewed and are negative.    Physical Exam Updated Vital Signs BP (!) 143/78   Pulse 65   Temp 98.5 F (36.9 C) (Oral)   Resp 18   Ht 5\' 9"  (1.753 m)   Wt 170 lb (77.1 kg)   SpO2 97%   BMI 25.10 kg/m   Physical Exam  Constitutional: She is oriented to person, place, and time. She appears well-developed and well-nourished. No distress.  HENT:  Head: Normocephalic and atraumatic.  Eyes: EOM are normal.  Neck: Normal range of motion.  Cardiovascular: Normal rate, regular rhythm and normal heart sounds.   Pulmonary/Chest: Effort normal and breath sounds normal.  Abdominal: Soft. She exhibits no distension. There is no tenderness.  Musculoskeletal: Normal range of motion. She exhibits edema and tenderness.  Mild swelling of LLE. Left calf tenderness. NVI.  Neurological: She is alert and oriented to person, place, and time.  Skin: Skin is warm and dry.  Psychiatric: She has a normal mood and affect. Judgment normal.  Nursing note and vitals reviewed.    ED Treatments / Results  DIAGNOSTIC STUDIES: Oxygen Saturation is 97% on RA, normal by my interpretation.    COORDINATION OF CARE: 8:03 PM Discussed treatment plan with pt at bedside, which includes labs, and pt agreed to plan.  Labs (all labs ordered are listed, but only abnormal results are displayed) Labs Reviewed - No data to display  EKG  EKG Interpretation None       Radiology No results found.  Procedures Procedures (including critical care time)  Medications Ordered  in ED Medications  enoxaparin (LOVENOX) injection 75 mg (not administered)  oxyCODONE-acetaminophen (PERCOCET/ROXICET) 5-325 MG per tablet 1 tablet (not administered)  ibuprofen (ADVIL,MOTRIN) tablet 400 mg (not administered)     Initial Impression / Assessment and Plan / ED Course  I have reviewed the triage vital signs and the nursing notes.  Pertinent labs & imaging results that were available during my care of the patient were reviewed by me and considered in my medical decision making (see chart for details).     60yF with atraumatic LLE pain. Concerned for DVT. Hx of the same. Unfortunately, cannot get an [place] at this hour. Empiric lovenox. Return for [place] tomorrow. It has been determined that no acute conditions requiring further emergency intervention are present at this time. The patient has been advised of the diagnosis and plan. I reviewed any labs and imaging including any potential incidental findings. We have discussed signs and symptoms that warrant return to the ED and they are listed in the discharge instructions.    Final Clinical Impressions(s) / ED Diagnoses   Final diagnoses:  Left leg pain    New Prescriptions New Prescriptions   No medications on file   I personally preformed the services scribed in my presence. The recorded information has been reviewed is accurate. Virgel Manifold, MD.    Virgel Manifold, MD 01/18/17 1556

## 2017-01-13 ENCOUNTER — Telehealth: Payer: Self-pay | Admitting: Family Medicine

## 2017-01-13 ENCOUNTER — Emergency Department (HOSPITAL_COMMUNITY)
Admission: EM | Admit: 2017-01-13 | Discharge: 2017-01-13 | Disposition: A | Discharge status: Home / Self Care | Payer: BLUE CROSS/BLUE SHIELD | Attending: Emergency Medicine | Admitting: Emergency Medicine

## 2017-01-13 ENCOUNTER — Ambulatory Visit (HOSPITAL_COMMUNITY)
Admission: RE | Admit: 2017-01-13 | Discharge: 2017-01-13 | Disposition: A | Discharge status: Home / Self Care | Payer: BLUE CROSS/BLUE SHIELD | Source: Ambulatory Visit | Attending: Family Medicine | Admitting: Family Medicine

## 2017-01-13 ENCOUNTER — Encounter (HOSPITAL_COMMUNITY): Payer: Self-pay

## 2017-01-13 DIAGNOSIS — I82402 Acute embolism and thrombosis of unspecified deep veins of left lower extremity: Secondary | ICD-10-CM | POA: Insufficient documentation

## 2017-01-13 DIAGNOSIS — M79605 Pain in left leg: Secondary | ICD-10-CM

## 2017-01-13 DIAGNOSIS — Z86718 Personal history of other venous thrombosis and embolism: Secondary | ICD-10-CM | POA: Insufficient documentation

## 2017-01-13 DIAGNOSIS — F17210 Nicotine dependence, cigarettes, uncomplicated: Secondary | ICD-10-CM | POA: Diagnosis not present

## 2017-01-13 DIAGNOSIS — I824Z2 Acute embolism and thrombosis of unspecified deep veins of left distal lower extremity: Secondary | ICD-10-CM

## 2017-01-13 DIAGNOSIS — I82492 Acute embolism and thrombosis of other specified deep vein of left lower extremity: Secondary | ICD-10-CM

## 2017-01-13 HISTORY — DX: Acute embolism and thrombosis of unspecified deep veins of unspecified lower extremity: I82.409

## 2017-01-13 LAB — BASIC METABOLIC PANEL
Anion gap: 8 (ref 5–15)
BUN: 11 mg/dL (ref 6–20)
CHLORIDE: 106 mmol/L (ref 101–111)
CO2: 24 mmol/L (ref 22–32)
CREATININE: 0.85 mg/dL (ref 0.44–1.00)
Calcium: 9.4 mg/dL (ref 8.9–10.3)
Glucose, Bld: 103 mg/dL — ABNORMAL HIGH (ref 65–99)
Potassium: 3.8 mmol/L (ref 3.5–5.1)
SODIUM: 138 mmol/L (ref 135–145)

## 2017-01-13 MED ORDER — RIVAROXABAN (XARELTO) VTE STARTER PACK (15 & 20 MG): ORAL_TABLET | ORAL | 0 refills | Status: DC

## 2017-01-13 NOTE — Discharge Instructions (Signed)
Start Xarelto and prescribed. Follow-up with your primary care doctor in the next few days. Deep vein thrombosis is isolated to just the gastrocnemius vein in the calf. Return for any new or worse symptoms. To include chest pain or shortness of breath.

## 2017-01-13 NOTE — ED Triage Notes (Signed)
Pt was seen last night due left leg pain. Has hx of DVT in same leg and is not on blood thinners. Pt instructed to return today for ultrasound

## 2017-01-13 NOTE — Telephone Encounter (Signed)
Can you call her and let her know that there is already an order in the system so she just needs to find out where to go to get this done.

## 2017-01-13 NOTE — ED Notes (Signed)
Patient verbalizes understanding of discharge instructions, prescriptions, home care and follow up care. Patient out of department at this time. 

## 2017-01-13 NOTE — Telephone Encounter (Signed)
PCP is Harland Dingwall NP w/ [facility]. Neuro-opthalmic consult notes faxed to Dr. Delman Cheadle, referring ophthalmologist, F # [phone].

## 2017-01-13 NOTE — Telephone Encounter (Signed)
[facility] can not schedule anything that ER puts in as orders so I had to cancel order and put in order for leg to be done. I called and notified patient to call scheduling. [phone]

## 2017-01-13 NOTE — Telephone Encounter (Signed)
Pt was notified to follow-up here in 2-4 weeks

## 2017-01-13 NOTE — ED Provider Notes (Signed)
[facility] DEPT Provider Note   CSN: [id number] Arrival date & time: 01/13/17  1651     History   Chief Complaint Chief Complaint  Patient presents with  . Leg Pain    HPI Rhonda Hudson is a 61 y.o. female.  Patient seen yesterday in them are far for some left leg pain and swelling. Patient had outpatient Doppler study arranged but never got an appointment so she contacted her primary care doctor today who arranged a study. Study was done and was positive for a deep vein thrombosis. Patient without any chest pain or shortness of breath. Patient about a year ago did have a blood clot in the leg and was on a blood thinner. Been off the blood thinners now for about 9 months.      Past Medical History:  Diagnosis Date  . Diverticulitis   . DVT (deep venous thrombosis) ([place])   . Hiatal hernia with gastroesophageal reflux    disease  . Insomnia   . Irritable bowel syndrome   . Multiple sclerosis ([place])   . Panic attacks    Hx of    Patient Active Problem List   Diagnosis Date Noted  . Acute loss of vision, left 10/14/2016  . History of hypothyroidism 01/04/2016  . DVT (deep venous thrombosis), left 01/04/2016    Past Surgical History:  Procedure Laterality Date  . CHOLECYSTECTOMY    . TUBAL LIGATION    . US ECHOCARDIOGRAPHY  01-31-2009   EF 60%    OB History    No data available       Home Medications    Prior to Admission medications   Medication Sig Start Date End Date Taking? Authorizing Provider  ibuprofen (ADVIL,MOTRIN) 200 MG tablet Take 800 mg by mouth every 6 (six) hours as needed for headache, mild pain or moderate pain.    Yes Historical Provider, MD  Rivaroxaban 15 & 20 MG TBPK Take as directed on package: Start with one 15mg  tablet by mouth twice a day with food. On Day 22, switch to one 20mg  tablet once a day with food. 01/13/17   Fredia Sorrow, MD    Family History Family History  Problem Relation Age of Onset  . Multiple sclerosis  Neg Hx   . Autoimmune disease Neg Hx     Social History Social History  Substance Use Topics  . Smoking status: Current Some Day Smoker    Packs/day: 0.50    Types: Cigarettes  . Smokeless tobacco: Never Used  . Alcohol use No     Allergies   Patient has no known allergies.   Review of Systems Review of Systems  Constitutional: Negative for fever.  HENT: Negative for congestion.   Eyes: Negative for visual disturbance.  Respiratory: Negative for shortness of breath.   Cardiovascular: Positive for leg swelling. Negative for chest pain.  Gastrointestinal: Negative for abdominal pain and blood in stool.  Genitourinary: Negative for dysuria.  Musculoskeletal: Negative for back pain.  Skin: Negative for wound.  Hematological: Does not bruise/bleed easily.  Psychiatric/Behavioral: Negative for confusion.     Physical Exam Updated Vital Signs BP 139/83 (BP Location: Right Arm)   Pulse 62   Temp 98.4 F (36.9 C) (Oral)   Resp 17   Ht 5\' 9"  (1.753 m)   Wt 77.1 kg   SpO2 97%   BMI 25.10 kg/m   Physical Exam  Constitutional: She is oriented to person, place, and time. She appears well-developed and well-nourished. No  distress.  HENT:  Head: Normocephalic and atraumatic.  Mouth/Throat: Oropharynx is clear and moist.  Eyes: EOM are normal. Pupils are equal, round, and reactive to light.  Neck: Normal range of motion. Neck supple.  Cardiovascular: Normal rate and regular rhythm.   Pulmonary/Chest: Effort normal and breath sounds normal. No respiratory distress.  Abdominal: Soft. Bowel sounds are normal. There is no tenderness.  Musculoskeletal: Normal range of motion. She exhibits edema.  Left leg with evidence of some varicose veins. Also some slight swelling to the left ankle area. No particular calf tenderness to palpation. Good cap refill distally.  Neurological: She is alert and oriented to person, place, and time. No cranial nerve deficit or sensory deficit. She  exhibits normal muscle tone. Coordination normal.  Skin: Skin is warm.  Nursing note and vitals reviewed.    ED Treatments / Results  Labs (all labs ordered are listed, but only abnormal results are displayed) Labs Reviewed  BASIC METABOLIC PANEL - Abnormal; Notable for the following:       Result Value   Glucose, Bld 103 (*)    All other components within normal limits    EKG  EKG Interpretation None       Radiology US Venous Img Lower Unilateral Left  Result Date: 01/13/2017 CLINICAL DATA:  61 year old presenting with acute onset of left lower extremity pain and swelling localizing to the calf. Personal history of left lower extremity DVT. EXAM: LEFT LOWER EXTREMITY VENOUS DOPPLER ULTRASOUND TECHNIQUE: Gray-scale sonography with graded compression, as well as color Doppler and duplex ultrasound were performed to evaluate the lower extremity deep venous systems from the level of the common femoral vein and including the common femoral, femoral, profunda femoral, popliteal and calf veins including the posterior tibial, peroneal and gastrocnemius veins when visible. The superficial great saphenous vein was also interrogated. Spectral Doppler was utilized to evaluate flow at rest and with distal augmentation maneuvers in the common femoral, femoral and popliteal veins. COMPARISON:  05/31/2016. FINDINGS: Contralateral Right Common Femoral Vein: Respiratory phasicity is normal and symmetric with the symptomatic side. No evidence of thrombus. Normal compressibility. Common Femoral Vein: No evidence of thrombus. Normal compressibility, respiratory phasicity and response to augmentation. Saphenofemoral Junction: No evidence of thrombus. Normal compressibility and flow on color Doppler imaging. Profunda Femoral Vein: No evidence of thrombus. Normal compressibility and flow on color Doppler imaging. Femoral Vein: No evidence of thrombus. Normal compressibility, respiratory phasicity and response to  augmentation. Popliteal Vein: No evidence of thrombus. Normal compressibility, respiratory phasicity and response to augmentation. Calf Veins: No evidence of thrombus and normal compressibility and flow on color Doppler imaging for the anterior tibial, posterior tibial and peroneal veins. Thrombus within the gastrocnemius vein in the calf. Superficial Great Saphenous Vein: No evidence of thrombus. Normal compressibility and flow on color Doppler imaging. Venous Reflux:  Not evaluated. Other Findings:  None. IMPRESSION: Isolated distal deep vein thrombosis involving the left gastrocnemius vein in the calf. The remainder of the deep venous system, including the remaining calf veins, is patent. Electronically Signed   By: Evangeline Dakin M.D.   On: 01/13/2017 16:36    Procedures Procedures (including critical care time)  Medications Ordered in ED Medications - No data to display   Initial Impression / Assessment and Plan / ED Course  I have reviewed the triage vital signs and the nursing notes.  Pertinent labs & imaging results that were available during my care of the patient were reviewed by me and considered in  my medical decision making (see chart for details).    Patient seen in the emergency department yesterday. For left leg pain. Doppler studies were ordered to be done but patient never formally got an appointment so she contacted her primary care provider that set up ultrasound Doppler study of the left leg. It did show evidence of a deep vein thrombosis in the gastrocnemius muscle of the calf. Not one of the classic slight popliteal or tibia or peroneal. However will go ahead and start Xarelto. Patient's primary care provider can make final decision whether they want to go through the full course. Patient has had trouble with the deep vein thrombosis in the past. It's been about 9 months since patient's been on the blood thinner. She was Xarelto in the past and did fine. Patient has no  bleeding concerns. And the patient's renal function is normal.   Final Clinical Impressions(s) / ED Diagnoses   Final diagnoses:  Acute deep vein thrombosis (DVT) of other specified vein of left lower extremity (HCC)    New Prescriptions New Prescriptions   RIVAROXABAN 15 & 20 MG TBPK    Take as directed on package: Start with one 15mg  tablet by mouth twice a day with food. On Day 22, switch to one 20mg  tablet once a day with food.     Fredia Sorrow, MD 01/13/17 2019

## 2017-01-13 NOTE — Telephone Encounter (Signed)
Pt said she went to [facility] yesterday because she is having the same symptom that she's had in the past when she had a blood clot in her leg. Leg is swollen, hurting and having difficulty walking. Per pt, [facility] did not have anyone there to do an ultrasound so they told her to expect a phone call from them to come in to get it done today. Pt is concerned that she still has not received a call yet. Can [name] help her get the ultrasound?

## 2017-01-13 NOTE — Telephone Encounter (Signed)
Let's have her get on my schedule in the 2-4 weeks but get the [place] study and of her leg first as ordered by the emergency room physician. I will see her sooner if needed.

## 2017-01-14 ENCOUNTER — Ambulatory Visit (INDEPENDENT_AMBULATORY_CARE_PROVIDER_SITE_OTHER): Payer: BLUE CROSS/BLUE SHIELD | Admitting: Family Medicine

## 2017-01-14 ENCOUNTER — Encounter: Payer: Self-pay | Admitting: Family Medicine

## 2017-01-14 VITALS — BP 130/80 | HR 72 | Temp 98.4°F | Resp 16 | Wt 214.0 lb

## 2017-01-14 DIAGNOSIS — Z9119 Patient's noncompliance with other medical treatment and regimen: Secondary | ICD-10-CM

## 2017-01-14 DIAGNOSIS — Z91199 Patient's noncompliance with other medical treatment and regimen due to unspecified reason: Secondary | ICD-10-CM | POA: Insufficient documentation

## 2017-01-14 DIAGNOSIS — F17200 Nicotine dependence, unspecified, uncomplicated: Secondary | ICD-10-CM | POA: Insufficient documentation

## 2017-01-14 DIAGNOSIS — I82402 Acute embolism and thrombosis of unspecified deep veins of left lower extremity: Secondary | ICD-10-CM | POA: Diagnosis not present

## 2017-01-14 NOTE — Progress Notes (Signed)
Subjective:    Patient ID: Rhonda Hudson, female    DOB: [birth year], 61 y.o.   MRN: [id number]  HPI Chief Complaint  Patient presents with  . follow-up    follow-up on blood clot. no traveling or no sitting for long periods of time   She is 61 year old caucasian female with a history of DVT in her LLE last April who is here due to being diagnosed with a new DVT in her LLE yesterday. She complains of left calf pain and tenderness that is worse with weight bearing and ambulation. She was given a prescription for Xarelto but she has the prescription with her and states she has not gotten it filled yet. States she is "mad that this has happened again".  She was given a lovenox injection at the ED 2 evenings ago but no anticoagulants since.   No obvious explanation for this DVT as patient denies recent surgery, immobilization, hormone therapy, long car or airplane rides. Denies family history of clotting disorders.    Previous DVT was April 2017 and also in her left gastrocnemius vein. She was on Xarelto for 3 months at that time. This was thought to be provoked by long car rides (6 hours) 2-3 days per week but exact etiology was not determined.   She had an [place] of her LLE in September 2017 due to leg pain and it was negative for DVT.    Smokes cigarettes 1/2 ppd.   Recent work up at her neurologist, Dr. Jaynee Eagles, ruled out MS and no further evaluation or treatment needed by neurology. I will delete this from her medical history. Patient is ok with this. Evaluation by neuro ophthalmologist Dr. Hassell Done does not show any optic nerve or CNS dysfunction and recommended cataract removal. She is to follow up with Dr. Delman Cheadle her ophthalmologist.    Recent positive quantiferon test with negative chest XR.   Past Medical History:  Diagnosis Date  . Diverticulitis   . DVT (deep venous thrombosis) ([place])   . Hiatal hernia with gastroesophageal reflux    disease  . Insomnia   . Irritable bowel syndrome    . Panic attacks    Hx of   Past Surgical History:  Procedure Laterality Date  . CHOLECYSTECTOMY    . TUBAL LIGATION    . US ECHOCARDIOGRAPHY  01-31-2009   EF 60%     IMPRESSION: Isolated distal deep vein thrombosis involving the left gastrocnemius vein in the calf. The remainder of the deep venous system, including the remaining calf veins, is patent.   Electronically Signed   By: Evangeline Dakin M.D.   On: 01/13/2017 16:36  Review of Systems Pertinent positives and negatives in the history of present illness.     Objective:   Physical Exam  Constitutional: She is oriented to person, place, and time. She appears well-developed and well-nourished. No distress.  Cardiovascular: Normal rate, regular rhythm, normal heart sounds and intact distal pulses.  Exam reveals no gallop and no friction rub.   No murmur heard. Pulmonary/Chest: Effort normal and breath sounds normal. She exhibits no tenderness.  Musculoskeletal:       Right lower leg: Normal.       Left lower leg: She exhibits tenderness. She exhibits no swelling.       Left foot: Normal.  Varicose veins to anterior lower leg. Mild tenderness to left mid calf. No edema. Normal cap refill, warmth, sensation, pulses, ROM and strength.   Neurological: She is  alert and oriented to person, place, and time. No cranial nerve deficit. Coordination normal.  Skin: Skin is warm and dry. No rash noted. No erythema. No pallor.  Psychiatric: She has a normal mood and affect. Her speech is normal and behavior is normal. Thought content normal.   BP 130/80   Pulse 72   Temp 98.4 F (36.9 C)   Resp 16   Wt 214 lb (97.1 kg)   SpO2 98%   BMI 31.60 kg/m       Assessment & Plan:  DVT, recurrent, lower extremity, acute, left (HCC) - Plan: Recurrent Miscarriage Eval/Coag Pnl  Smoker  Personal history of noncompliance with medical treatment, presenting hazards to health  No cardiopulmonary symptoms.  Discussed that we will  check a coag panel since she has not yet started Xarelto. She did receive a dose of Lovenox at the ED on Sunday night but this should have cleared her system at this point based on half life.  Advised her to start taking Xarelto when she leaves here today.  She will avoid any NSAIDs such as Ibuprofen, Aleve, Aspirin and discussed increased risk of bleeding with these medications when added to Xarelto.  I also counseled her on the fact that she is putting herself in danger and at risk of worsening health conditions by not taking the Xarelto with known DVT. She plans to follow recommendations and verbalizes understanding.  Recommend using heat to the area and elevating her LLE when she it sitting.  She may take Tylenol for pain. States she cannot take a prescription home for pain medication because her "sons will steal them".  Advised her to stop smoking.  Will keep her out of work due to pain until Monday and then re-evaluate.  Spent at least 25 minutes face to face with patient and at least 50% was in counseling and coordination of care.

## 2017-01-17 LAB — RFX PTT-LA W/RFX TO HEX PHASE CONF: PTT-LA SCREEN: 45 s — AB (ref <=40)

## 2017-01-17 LAB — RFX DRVVT SCR W/RFLX CONF 1:1 MIX: dRVVT Screen: 40 s (ref <=45)

## 2017-01-20 ENCOUNTER — Ambulatory Visit (INDEPENDENT_AMBULATORY_CARE_PROVIDER_SITE_OTHER): Payer: BLUE CROSS/BLUE SHIELD | Admitting: Family Medicine

## 2017-01-20 ENCOUNTER — Encounter: Payer: Self-pay | Admitting: Family Medicine

## 2017-01-20 VITALS — BP 120/82 | HR 82 | Resp 16

## 2017-01-20 DIAGNOSIS — Z23 Encounter for immunization: Secondary | ICD-10-CM

## 2017-01-20 DIAGNOSIS — I82402 Acute embolism and thrombosis of unspecified deep veins of left lower extremity: Secondary | ICD-10-CM

## 2017-01-20 DIAGNOSIS — S40811A Abrasion of right upper arm, initial encounter: Secondary | ICD-10-CM | POA: Diagnosis not present

## 2017-01-20 NOTE — Progress Notes (Signed)
   Subjective:    Patient ID: Rhonda Hudson, female    DOB: 1956/03/26, 61 y.o.   MRN: [id number]  HPI Chief Complaint  Patient presents with  . follow-up    follow-up on blood clot- dull ache all the time   She is here to follow up on left calf pain from DVT. States she was unable to find the Xarelto starter pack so her pharmacy had to order it. She did not call to let me know, states she did not think about calling me. She just started  Xarelto 2 days ago.  States her left lower leg has a constant dull ache but has improved somewhat. She is no longer walking with a walker. States she cannot return to work yet because she has to walk and be on her feet for 8 hours. States she cannot do "light" duty or work shorter shifts, states this is not allowed by her employer.   Denies fever, chills, chest pain, palpitations, shortness of breath, cough.   States she fell 2 days ago in a gravel parking lot and has an abrasion on her right elbow. States she cleaned it out with peroxide. Cannot recall last tetanus shot. Does not want this checked. States she just cleaned it and put a new dressing on it. Denies hitting her head or LOC. Denies neck or back pain.   She is still not ready to stop smoking.   Reviewed allergies, medications, past medical, surgical,   and social history.   Review of Systems Pertinent positives and negatives in the history of present illness.     Objective:   Physical Exam BP 120/82   Pulse 82   Resp 16   SpO2 98%   Alert and oriented and in no acute distress.  LLE is neurovascularly intact. Left calf without erythema, warmth or edema. Moderate tenderness to left calf. Antalgic gait.  Right elbow with dressing in place that is clean, dry and intact. Patient refused removal. No surrounding erythema.       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 Leg pain is improving but she does not feel that she is ready to go back to her job which requires her to stand and walk for 8 straight hours. Work note will be provided for this week. Encouraged her to find out if her employer has documentation that is needed.  Right arm with clean bandage and she refuses examination. Will update Tdap, previous one is unknown. No sign of infection.  Still awaiting hypercoag panel. Will follow up pending.  Strict orders to call or return if she notices any worsening symptoms and prior to running out of the starter pack of Xarelto. Will need to refill and she is aware that she will need to stay on this medication at least 6 months and maybe longer pending lab results.

## 2017-01-20 NOTE — Patient Instructions (Signed)
Check with your employer regarding paperwork for time off.   It is important that you take your Xarelto daily. Call me in 2 weeks and BEFORE your prescription runs out so that we can send in the new prescription.   We will call you with your lab results.

## 2017-01-21 DIAGNOSIS — S40811A Abrasion of right upper arm, initial encounter: Secondary | ICD-10-CM | POA: Diagnosis not present

## 2017-01-21 DIAGNOSIS — Z23 Encounter for immunization: Secondary | ICD-10-CM | POA: Diagnosis not present

## 2017-01-23 ENCOUNTER — Other Ambulatory Visit: Payer: Self-pay | Admitting: Family Medicine

## 2017-01-23 DIAGNOSIS — I82409 Acute embolism and thrombosis of unspecified deep veins of unspecified lower extremity: Secondary | ICD-10-CM

## 2017-01-23 DIAGNOSIS — R899 Unspecified abnormal finding in specimens from other organs, systems and tissues: Secondary | ICD-10-CM

## 2017-01-23 LAB — RFLX HEXAGONAL PHASE CONFIRM: HEXAGONAL PHASE CONFIRM: NEGATIVE

## 2017-02-05 ENCOUNTER — Other Ambulatory Visit: Payer: Self-pay | Admitting: Family Medicine

## 2017-02-05 ENCOUNTER — Telehealth: Payer: Self-pay | Admitting: Internal Medicine

## 2017-02-05 MED ORDER — RIVAROXABAN 20 MG PO TABS: 20.0000 mg | ORAL_TABLET | Freq: Every day | ORAL | 2 refills | Status: DC

## 2017-02-05 NOTE — Telephone Encounter (Signed)
I will send in her Xarelto. As far as the wound and her falling, I recommend that she come in for an office visit. In regards to pain medicine, she can take Tylenol but should avoid Ibuprofen, Aleve, aspirin, or any other anti-inflammatories. She and I discussed a stronger pain medication at our last visit and she declined.  I will leave the decision for her to travel in October up to the hematologist but I think she will be able to safely do this.

## 2017-02-05 NOTE — Telephone Encounter (Signed)
Pt called and had a couple things to say.  1- needs a refill on xarelto 20mg  to [facility] [address] 2- fell Friday and busted open left arm and she thinks its infected and wanted to know what she can do over the counter. She is using peroxide to clean it. (pt was advised by me to be seen for possible antitbiotic if it was infected but patient declined and didn't want to come in) 3- pt states that since she went back to work she is in extreme pain for her leg and wants to know what can she take for it. 4- pt is going on a crus[place] in October and wants to know if it is okay to travel with the blood clots she has. (im not sure where she is going)

## 2017-02-05 NOTE — Telephone Encounter (Signed)
Pt was notified of vickie's recommendations 1- she declines coming in for a visit 2- she declines any kind of pain medication as she can not have pain medication in her house and tylenol doesn't work for her so she will just go without anything.  3- I spoke to Hematology and they are booked out until July right now but when they call and pt is willing to go to [facility] to hematology they will forward the referral to them but not sure how booked out [facility] is. They will contact patient for this soon.

## 2017-02-14 ENCOUNTER — Telehealth: Payer: Self-pay | Admitting: Family Medicine

## 2017-02-14 NOTE — Telephone Encounter (Signed)
Pt called

## 2017-02-14 NOTE — Telephone Encounter (Signed)
Samples and coupon card at the front for her. Thanks .

## 2017-02-14 NOTE — Telephone Encounter (Signed)
Pt called and stated that Xarelto is $50 and she cant afford that. She needs either samples or something less expensive. Pt uses [facility] [address] And can be reached at [phone].

## 2017-02-17 LAB — RECURRENT MISCARRIAGE EVAL/COAG PNL
AntiThromb III Func: 103 % activity (ref 80–120)
Beta2-Glycoprotein I (IgA): <9 SAU (ref <=20)
Beta2-Glycoprotein I (IgG): <9 SGU (ref <=20)
Beta2-Glycoprotein I (IgM): <9 SMU (ref <=20)
Cardiolipin Ab (IgA): <11 [APL'U] (ref <=11)
Cardiolipin Ab (IgM): <12 [MPL'U] (ref <=12)
PROTEIN S AG FREE: 122 %{normal} (ref 50–147)
Phosphatidylserine (IgG): <10 U/mL (ref <10)
Protein C Activity: 177 % (ref 70–180)

## 2017-02-20 ENCOUNTER — Telehealth: Payer: Self-pay | Admitting: Internal Medicine

## 2017-02-20 NOTE — Telephone Encounter (Signed)
Will call back tomorrow if she wants to schedule something

## 2017-02-20 NOTE — Telephone Encounter (Signed)
She does need an office visit. Cannot rule out GI bleed and I would need to evaluate her for this. Thanks.

## 2017-02-20 NOTE — Telephone Encounter (Signed)
Pt called and states her stool is dark black. She does not know if its tarry looking or coffee grind looking. Pt has not been taking any kind of peptobiosmal. She is tired all the time, and her leg hurts all the time. She is not conspitated or having any diarrhea today. Pt was advised she needs an appt but doesn't want to schedule an appt. Pt will call back tomorrow if she wants to schedule an appt.

## 2017-03-20 ENCOUNTER — Encounter (HOSPITAL_COMMUNITY): Payer: Self-pay

## 2017-03-20 ENCOUNTER — Encounter (HOSPITAL_COMMUNITY): Payer: BLUE CROSS/BLUE SHIELD | Attending: Oncology | Admitting: Oncology

## 2017-03-20 VITALS — BP 151/72 | HR 76 | Temp 98.1°F | Resp 16 | Ht 69.0 in | Wt 210.6 lb

## 2017-03-20 DIAGNOSIS — Z720 Tobacco use: Secondary | ICD-10-CM

## 2017-03-20 DIAGNOSIS — I82492 Acute embolism and thrombosis of other specified deep vein of left lower extremity: Secondary | ICD-10-CM | POA: Diagnosis not present

## 2017-03-20 DIAGNOSIS — Z7901 Long term (current) use of anticoagulants: Secondary | ICD-10-CM | POA: Diagnosis not present

## 2017-03-20 DIAGNOSIS — I82402 Acute embolism and thrombosis of unspecified deep veins of left lower extremity: Secondary | ICD-10-CM

## 2017-03-20 DIAGNOSIS — Z806 Family history of leukemia: Secondary | ICD-10-CM

## 2017-03-20 NOTE — Patient Instructions (Addendum)
Rhonda Hudson at [facility] Discharge Instructions  RECOMMENDATIONS MADE BY THE CONSULTANT AND ANY TEST RESULTS WILL BE SENT TO YOUR REFERRING PHYSICIAN.  Return if symptoms worsen or fail to improve    Thank you for choosing [facility] at [facility] to provide your oncology and hematology care.  To afford each patient quality time with our provider, please arrive at least 15 minutes before your scheduled appointment time.    If you have a lab appointment with the [facility] please come in thru the  Main Entrance and check in at the main information desk  You need to re-schedule your appointment should you arrive 10 or more minutes late.  We strive to give you quality time with our providers, and arriving late affects you and other patients whose appointments are after yours.  Also, if you no show three or more times for appointments you may be dismissed from the clinic at the providers discretion.     Again, thank you for choosing [facility].  Our hope is that these requests will decrease the amount of time that you wait before being seen by our physicians.       _____________________________________________________________  Should you have questions after your visit to [facility], please contact our office at [phone] between the hours of 8:30 a.m. and 4:30 p.m.  Voicemails left after 4:30 p.m. will not be returned until the following business day.  For prescription refill requests, have your pharmacy contact our office.       Resources For Cancer Patients and their Caregivers ? American Cancer Society: Can assist with transportation, wigs, general needs, runs Look Good Feel Better.        956 034 7503 ? Cancer Care: Provides financial assistance, online support groups, medication/co-pay assistance.  1-800-813-HOPE 509-455-3721) ? [place] Assists [place] cancer patients and  their families through emotional , educational and financial support.  928-257-8860 ? [place] DSS Where to apply for food stamps, Medicaid and utility assistance. (843)164-3078 ? [facility]: Transportation to medical appointments. 614-536-6502 ? Social Security Administration: May apply for disability if have a Stage IV cancer. 740-061-9890 317-382-8814 ? [employer], Disability and Transit Services: Assists with nutrition, care and transit needs. [place] Support Programs: @10RELATIVEDAYS @ > Cancer Support Group  2nd Tuesday of the month 1pm-2pm, Journey Room  > Creative Journey  3rd Tuesday of the month 1130am-1pm, Journey Room  > Look Good Feel Better  1st Wednesday of the month 10am-12 noon, Journey Room (Call [facility] to register [phone])

## 2017-03-20 NOTE — Progress Notes (Signed)
[facility] Initial Visit:  Patient Care Team: Girtha Rm, NP-C as PCP - General (Family Medicine)  CHIEF COMPLAINTS/PURPOSE OF CONSULTATION: Recurrent DVTs  HISTORY OF PRESENTING ILLNESS: Rhonda Hudson 61 y.o. female is here because of left leg DVT.  01/12/17: Presented to [facility] ED with 2 day history of severe 10/10 calf pain. No provoking causes. 01/14/15: Doppler venous US of LLE: Isolated distal deep vein thrombosis involving the left gastrocnemius vein in the calf. Started on xarelto. 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 Heterozygotes have a 2-4-fold increased risk for venous thrombosis. Lupus anticoagulant also mildly elevated at 45.  Previous history of DVT in the left leg in April 2017, at that time it was thought to be provoked due to a lot of car travel for 4 hours at a time. She was anticoagulated with xarelto for 3 months with resolution of her DVT.  Patient denies any family history of thromboembolism to her knowledge. She continues to have left calf pain despite being on anticoagulation for 3 months now.  Review of Systems  Constitutional: Negative for appetite change, chills, fatigue and fever.  HENT:   Negative for hearing loss, lump/mass, mouth sores, sore throat and tinnitus.   Eyes: Negative for eye problems and icterus.  Respiratory: Negative for chest tightness, cough, hemoptysis, shortness of breath and wheezing.   Cardiovascular: Negative for chest pain, leg swelling and palpitations.  Gastrointestinal: Negative for abdominal distention, abdominal pain, blood in stool, diarrhea, nausea and vomiting.  Endocrine: Negative.  Negative for hot flashes.  Genitourinary: Negative for difficulty urinating, frequency and hematuria.   Musculoskeletal: Negative for arthralgias and neck pain.       Left calf pain  Skin: Negative for itching and  rash.  Neurological: Negative for dizziness, headaches and speech difficulty.  Hematological: Negative for adenopathy. Does not bruise/bleed easily.  Psychiatric/Behavioral: Negative for confusion. The patient is not nervous/anxious.     MEDICAL HISTORY: Past Medical History:  Diagnosis Date  . Cataracts, bilateral   . Diverticulitis   . DVT (deep venous thrombosis) ([place])   . Hiatal hernia with gastroesophageal reflux    disease  . Insomnia   . Irritable bowel syndrome   . Panic attacks    Hx of    SURGICAL HISTORY: Past Surgical History:  Procedure Laterality Date  . CHOLECYSTECTOMY    . TOE SURGERY    . TUBAL LIGATION    . US ECHOCARDIOGRAPHY  01-31-2009   EF 60%    SOCIAL HISTORY: Social History   Social History  . Marital status: Divorced    Spouse name: N/A  . Number of children: 2  . Years of education: N/A   Occupational History  . [employer]    Social History Main Topics  . Smoking status: Current Some Day Smoker    Packs/day: 0.50    Types: Cigarettes  . Smokeless tobacco: Never Used  . Alcohol use No  . Drug use: No  . Sexual activity: Not on file   Other Topics Concern  . Not on file   Social History Narrative   Lives with two sons   Caffeine use: coffee and coke daily   Right-handed    FAMILY HISTORY Family History  Problem Relation Age of Onset  . Leukemia Father   . Leukemia Brother   . Multiple sclerosis Neg Hx   . Autoimmune disease Neg Hx  ALLERGIES:  has No Known Allergies.  MEDICATIONS:  Current Outpatient Prescriptions  Medication Sig Dispense Refill  . rivaroxaban (XARELTO) 20 MG TABS tablet Take 1 tablet (20 mg total) by mouth daily with supper. 30 tablet 2   No current facility-administered medications for this visit.    Facility-Administered Medications Ordered in Other Visits  Medication Dose Route Frequency Provider Last Rate Last Dose  . gadopentetate dimeglumine (MAGNEVIST) injection 20 mL  20 mL  Intravenous Once PRN Melvenia Beam, MD        PHYSICAL EXAMINATION:     Vitals:   03/20/17 0843  BP: (!) 151/72  Pulse: 76  Resp: 16  Temp: 98.1 F (36.7 C)    Filed Weights   03/20/17 0843  Weight: 210 lb 9.6 oz (95.5 kg)     Physical Exam  Constitutional: She is oriented to person, place, and time and well-developed, well-nourished, and in no distress. No distress.  HENT:  Head: Normocephalic and atraumatic.  Mouth/Throat: No oropharyngeal exudate.  Eyes: Conjunctivae are normal. Pupils are equal, round, and reactive to light. No scleral icterus.  Neck: Normal range of motion. Neck supple. No JVD present.  Cardiovascular: Normal rate, regular rhythm and normal heart sounds.  Exam reveals no gallop and no friction rub.   No murmur heard. Pulmonary/Chest: Breath sounds normal. No respiratory distress. She has no wheezes. She has no rales.  Abdominal: Soft. Bowel sounds are normal. She exhibits no distension. There is no tenderness. There is no guarding.  Musculoskeletal: She exhibits no edema or tenderness.  Varicose veins prominent in left leg.  Lymphadenopathy:    She has no cervical adenopathy.  Neurological: She is alert and oriented to person, place, and time. No cranial nerve deficit.  Skin: Skin is warm and dry. No rash noted. No erythema. No pallor.  Psychiatric: Affect and judgment normal.     LABORATORY DATA: I have personally reviewed the data as listed:  No visits with results within 1 Month(s) from this visit.  Latest known visit with results is:  Office Visit on 01/14/2017  Component Date Value Ref Range Status  . AntiThromb III Func 01/14/2017 103  80 - 120 % activity Final  . Lupus Anticoagulant Eval 01/14/2017 REPORT   Final   Comment: A Lupus Anticoaguant is not detected. Common causes for a prolonged screen and negative confirmatory test include factor deficiencies or anticoagulant therapy. Reference Range:  Not  Detected http://education.questdiagnostics.com/faq/LupusAnticoag ------------------------------------------------------- This interpretation is based on the following test results.   . Protein S Ag, Free 01/14/2017 122  50 - 147 % normal Final  . Result 01/14/2017 REPORT   Final   FACTOR V LEIDEN (G254Y) MUTATION NOT DETECTED  . Interpretation 01/14/2017 REPORT   Final   Comment: This individual is negative (normal) for the Factor V Leiden (R506Q) mutation in the Factor V gene. Increased risk of thrombophilia can be caused by a variety of genetic and non-genetic factors not screened for by this assay.   . Reviewer 01/14/2017 REPORT   Final   Comment: Karlyne Greenspan, Ph.D., [facility] Director, Molecular Genetics SUPPLEMENTAL INFORMATION The Factor V Leiden (R506Q) mutation [[id number]:c. 1601G>A (p.R534Q)] in the Factor V gene is one of the most common causes of inherited thrombophilia.  This mutation causes resistance to degradation of activated Factor V protein by activated Protein C (APC). The Factor V Leiden (R506Q) mutation is detected by amplification of the selected region of Factor V gene by polymerase chain reaction (PCR) and fluorescent probe  hybridization to the targeted region, followed by melting curve analysis with a real time PCR system. Although rare, false positive or false negative results may occur.  All results should be interpreted in context of clinical findings, relevant history, and other laboratory data. Health care providers, please contact your local [place] genetic counselor or call [phone]) for assistance with interpretation of these results. This test was de                          veloped and its analytical performance characteristics have been determined by [employer], [place], [place]. It has not been cleared or approved by the U.S. Food and Drug Administration. This assay has been validated  pursuant to the CLIA regulations and is used for clinical purposes.   . Protein C Activity 01/14/2017 177  70 - 180 % Final   Units: % of normal  . Result 01/14/2017 REPORT*  Final   POSITIVE FOR ONE COPY OF THE G20210A MUTATION  . 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 Heterozygotes have a 2-4-fold increased risk for venous thrombosis.  In addition, other family members may also have this mutation.  Consider genetic counseling and DNA testing for at-risk family members.   . Reviewer 01/14/2017 REPORT   Final   Comment: Karlyne Greenspan, Ph.D., [facility] Director, Molecular Genetics SUPPLEMENTAL INFORMATION The G20210A mutation [[id number]:g.21538G>A (c.*97G>A)] in the Prothrombin/Factor II gene is the second most common inherited risk factor for thrombosis occurring in approximately 2% of Caucasians.  Presence of the mutation is associated with an elevation of prothrombin levels to about 30% above normal in heterozygotes and to 70% above normal in homozygotes. The G20210A mutation is detected by amplification of the selected region of Factor II gene by polymerase chain reaction (PCR) and fluorescent probe hybridization to the targeted region, followed by melting curve analysis with a real time PCR system. Although rare, false positive or false negative results may occur.  All results should be interpreted in context of clinical findings, relevant history, and other laboratory data. Health care providers, please contact your local [place] genetic counselor or call [phone]                           [phone]) for assistance with interpretation of these results. This test was developed and its analytical performance characteristics have been determined by [employer], [place], [place]. It has not been cleared or approved by the U.S. Food  and Drug Administration. This assay has been validated pursuant to the CLIA regulations and is used for clinical purposes.   . Cardiolipin Ab (IgG) 01/14/2017 <14  <=14 GPL Final  . Cardiolipin Ab (IgM) 01/14/2017 <12  <=12 MPL Final  . Cardiolipin Ab (IgA) 01/14/2017 <11  <=11 APL Final   Comment:    Cardiolipin Ab (IgG)      Reference range:    Value Units Interpretation    <=14   GPL     Negative   15-20   GPL     Indeterminate   21-80   GPL     Low to Medium Positive     >80   GPL     High Positive    Cardiolipin Ab (IgM)      Reference range:    Value Units Interpretation    <=12  MPL     Negative   13-20   MPL     Indeterminate   21-80   MPL     Low to Medium Positive     >80   MPL     High Positive    Cardiolipin Ab (IgA)      Reference range:    Value Units Interpretation    <=11   APL     Negative   12-20   APL     Indeterminate   21-80   APL     Low to Medium Positive     >80   APL     High Positive The Antiphospholipid Antibody Syndrome (APS) is a clinical-pathologic correlation that includes a clinical event (e.g. thrombosis, pregnancy loss, thrombocytopenia) and persistent positive Antiphospholipid Antibodies (IgM or IgG ACA >40 MPL/GPL, IgM or IgG anti-B2GPI antibodies, or a Lupus Anticoagulant). The IgA isotype has been implicated in sm                          aller studies, but have not yet been incorporated into the APS criteria. International consensus guidelines suggest waiting at least 12 weeks before retesting to confirm antibody persistence. Reference J Thromb Haemost 2006: 4; 295   . Beta2-Glycoprotein I (IgG) 01/14/2017 <9  <=20 SGU Final  . Beta2-Glycoprotein I (IgM) 01/14/2017 <9  <=20 SMU Final  . Beta2-Glycoprotein I (IgA) 01/14/2017 <9  <=20 SAU Final   Comment: The Antiphospholipid Antibody Syndrome (APS) is a clinical-pathologic correlation that includes a clinical event (e.g. thrombosis, pregnancy loss, thrombocytopenia) and  persistent positive Antiphospholipid Antibodies (IgM or IgG ACA >40 MPL/GPL, IgM or IgG anti-B2GPI antibodies, or a Lupus Anticoagulant). The IgA isotype has been implicated in smaller studies, but have not yet been incorporated into the APS criteria. International consensus guidelines suggest waiting at least 12 weeks before retesting to confirm antibody persistence. Reference J Thromb Haemost 2006: 4; 295   . Phosphatidylserine (IgG) 01/14/2017 <10  <10 U/mL Final  . Phosphatidylserine (IgM) 01/14/2017 <25  <25 U/mL Final   Comment: Phosphatidylserine (IgG) Reference range:   <10 U/mL  Negative 10-20 U/mL  Equivocal - Found in small percentage             of the healthy population; may be reactive   >20 U/mL  Positive - Risk factor for thrombosis             and pregnancy loss. Phosphatidylserine (IgM) Reference range:   <25 U/mL  Negative 25-35 U/mL  Equivocal - Found in small percentage             of the healthy population; may be reactive   >35 U/mL  Positive - Risk factor for thrombosis             and pregnancy loss The Antiphospholipid Antibody Syndrome (APS) is a clinical-pathologic correlation that includes a clinical event (e.g. thrombosis, pregnancy loss, thrombocytopenia) and persistent positive Antiphospholipid Antibodies (IgM or IgG ACA >40 MPL/GPL, IgM or IgG anti-B2GPI antibodies, or a Lupus Anticoagulant). The IgA isotype has been implicated in smaller studies, but have not yet been incorporated into the APS criteria. International consensus guidelines suggest waiting at [place]                          st 12 weeks before retesting to confirm antibody persistence. Reference J Thromb Haemost 2006: 4; 295   .  Homocysteine 01/14/2017 CANCELED   Final   Comment: Test not performed, no serum was received.    Result canceled by the ancillary   . dRVVT Mix Interp. 01/14/2017 REPORT   Final   Not Indicated  . dRVVT Screen 01/14/2017 40  <=45 sec Final  . dRVVT  01/14/2017 REPORT   Final   Additional testing is not indicated.  Marland Kitchen Hexagonal Phase Confirm 01/14/2017 Negative  Negative Final  . PTT-LA Screen 01/14/2017 45* <=40 sec Final  . Additional Testing 01/14/2017 REPORT   Final   Not indicated    RADIOGRAPHIC STUDIES: I have personally reviewed the radiological images as listed and agree with the findings in the report  No results found.  ASSESSMENT/PLAN 61 year old female with history of recurrent left leg DVT, most recently now with an unprovoked distal deep vein thrombosis involving the left gastrocnemius vein in the calf on 4/301/8.  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   PLAN: -I have reviewed with patient her hypercoagulable workup results. 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 The most recent DVT is unprovoked. Therefore I have recommended her to be on lifelong anticoagulation. Continue xarelto at this time. I have advised her to take her xarelto with an at least 300 calorie meal to help with absorption. Advised her regarding the side effects of major bleeding while on anticoagulation.  -Her lupus anticoagulant was mildly elevated, however this could be a false positive in the acute setting of a DVT. Therefore I recommend repeating her lupus anticoagulant for confirmatory testing.  -Patient states she prefers for her PCP to manage her xarelto. Therefore I will discharge her from clinic and turn her over to her PCP for ongoing monitoring while she is on xarelto. -RTC PRN.   All questions were answered. The patient knows to call the clinic with any problems, questions or concerns.  This note was electronically signed.    Twana First, MD  03/20/2017 8:39 AM

## 2017-04-11 ENCOUNTER — Emergency Department (HOSPITAL_COMMUNITY)
Admission: EM | Admit: 2017-04-11 | Discharge: 2017-04-11 | Disposition: A | Discharge status: Home / Self Care | Payer: BLUE CROSS/BLUE SHIELD | Attending: Emergency Medicine | Admitting: Emergency Medicine

## 2017-04-11 ENCOUNTER — Encounter (HOSPITAL_COMMUNITY): Payer: Self-pay | Admitting: Emergency Medicine

## 2017-04-11 DIAGNOSIS — F17210 Nicotine dependence, cigarettes, uncomplicated: Secondary | ICD-10-CM | POA: Insufficient documentation

## 2017-04-11 DIAGNOSIS — E039 Hypothyroidism, unspecified: Secondary | ICD-10-CM | POA: Insufficient documentation

## 2017-04-11 DIAGNOSIS — M545 Low back pain: Secondary | ICD-10-CM | POA: Diagnosis present

## 2017-04-11 DIAGNOSIS — M5432 Sciatica, left side: Secondary | ICD-10-CM | POA: Diagnosis not present

## 2017-04-11 DIAGNOSIS — Z7901 Long term (current) use of anticoagulants: Secondary | ICD-10-CM | POA: Diagnosis not present

## 2017-04-11 MED ORDER — OXYCODONE-ACETAMINOPHEN 5-325 MG PO TABS
1.0000 | ORAL_TABLET | Freq: Once | ORAL | Status: AC
  Administered 2017-04-11: 1 via ORAL
  Filled 2017-04-11: qty 1

## 2017-04-11 MED ORDER — METHOCARBAMOL 500 MG PO TABS
500.0000 mg | ORAL_TABLET | Freq: Once | ORAL | Status: AC
  Administered 2017-04-11: 500 mg via ORAL
  Filled 2017-04-11: qty 1

## 2017-04-11 MED ORDER — METHOCARBAMOL 500 MG PO TABS: 500.0000 mg | ORAL_TABLET | Freq: Three times a day (TID) | ORAL | 0 refills | Status: DC

## 2017-04-11 MED ORDER — HYDROCODONE-ACETAMINOPHEN 5-325 MG PO TABS: ORAL_TABLET | ORAL | 0 refills | Status: DC

## 2017-04-11 NOTE — ED Provider Notes (Signed)
[facility] DEPT Provider Note   CSN: [id number] Arrival date & time: 04/11/17  0818     History   Chief Complaint Chief Complaint  Patient presents with  . Back Pain    HPI Rhonda Hudson is a 61 y.o. female.  HPI   Rhonda Hudson is a 61 y.o. female who presents to the Emergency Department complaining of left low back pain.  Pain began gradually 3 weeks ago and has progressed in severity.  She describes a throbbing constant pain to the left lower back that radiates intermittently into the hip and thigh.  Pain worse with weight bearing.  She denies known injury, urine or bowel changes, abd pain, numbness or weakness of the lower extremities. No relief from OTC pain relievers.   Past Medical History:  Diagnosis Date  . Cataracts, bilateral   . Diverticulitis   . DVT (deep venous thrombosis) ([place])   . Hiatal hernia with gastroesophageal reflux    disease  . Insomnia   . Irritable bowel syndrome   . Panic attacks    Hx of    Patient Active Problem List   Diagnosis Date Noted  . Smoker 01/14/2017  . Personal history of noncompliance with medical treatment, presenting hazards to health 01/14/2017  . Acute loss of vision, left 10/14/2016  . History of hypothyroidism 01/04/2016  . DVT, recurrent, lower extremity, acute, left ([place]) 01/04/2016    Past Surgical History:  Procedure Laterality Date  . CHOLECYSTECTOMY    . TOE SURGERY    . TUBAL LIGATION    . US ECHOCARDIOGRAPHY  01-31-2009   EF 60%    OB History    No data available       Home Medications    Prior to Admission medications   Medication Sig Start Date End Date Taking? Authorizing Provider  rivaroxaban (XARELTO) 20 MG TABS tablet Take 1 tablet (20 mg total) by mouth daily with supper. 02/05/17   Girtha Rm, NP-C    Family History Family History  Problem Relation Age of Onset  . Leukemia Father   . Leukemia Brother   . Multiple sclerosis Neg Hx   . Autoimmune disease Neg Hx      Social History Social History  Substance Use Topics  . Smoking status: Current Some Day Smoker    Packs/day: 0.50    Types: Cigarettes  . Smokeless tobacco: Never Used  . Alcohol use No     Allergies   Patient has no known allergies.   Review of Systems Review of Systems  Constitutional: Negative for fever.  Respiratory: Negative for shortness of breath.   Gastrointestinal: Negative for abdominal pain, constipation and vomiting.  Genitourinary: Negative for decreased urine volume, difficulty urinating, dysuria, flank pain and hematuria.  Musculoskeletal: Positive for back pain. Negative for joint swelling.  Skin: Negative for rash.  Neurological: Negative for weakness and numbness.  All other systems reviewed and are negative.    Physical Exam Updated Vital Signs BP 133/71 (BP Location: Right Arm)   Pulse 85   Temp 98.1 F (36.7 C) (Oral)   Resp 17   SpO2 98%   Physical Exam  Constitutional: She is oriented to person, place, and time. She appears well-developed and well-nourished. No distress.  HENT:  Head: Normocephalic and atraumatic.  Neck: Normal range of motion. Neck supple.  Cardiovascular: Normal rate, regular rhythm, normal heart sounds and intact distal pulses.   No murmur heard. Pulmonary/Chest: Effort normal and breath sounds normal. No  respiratory distress.  Abdominal: Soft. She exhibits no distension. There is no tenderness.  Musculoskeletal: She exhibits tenderness. She exhibits no edema.       Lumbar back: She exhibits tenderness and pain. She exhibits normal range of motion, no swelling, no deformity, no laceration and normal pulse.  ttp of the lower left lumbar paraspinal muscles and SI joint.  No spinal tenderness.  Pt has 5/5 strength against resistance of bilateral lower extremities.     Neurological: She is alert and oriented to person, place, and time. She has normal strength. No sensory deficit. She exhibits normal muscle tone.  Coordination and gait normal.  Reflex Scores:      Patellar reflexes are 2+ on the right side and 2+ on the left side.      Achilles reflexes are 2+ on the right side and 2+ on the left side. Skin: Skin is warm and dry. No rash noted.  Nursing note and vitals reviewed.    ED Treatments / Results  Labs (all labs ordered are listed, but only abnormal results are displayed) Labs Reviewed - No data to display  EKG  EKG Interpretation None       Radiology No results found.  Procedures Procedures (including critical care time)  Medications Ordered in ED Medications  methocarbamol (ROBAXIN) tablet 500 mg (not administered)  oxyCODONE-acetaminophen (PERCOCET/ROXICET) 5-325 MG per tablet 1 tablet (not administered)     Initial Impression / Assessment and Plan / ED Course  I have reviewed the triage vital signs and the nursing notes.  Pertinent labs & imaging results that were available during my care of the patient were reviewed by me and considered in my medical decision making (see chart for details).     Pt with likely sciatica.  NV intact.  Ambulates with steady gait.  No concerning sx's for emergent neurological process.   Final Clinical Impressions(s) / ED Diagnoses   Final diagnoses:  Sciatica of left side    New Prescriptions New Prescriptions   No medications on file     Kem Parkinson, PA-C 04/13/17 [place], [place], DO 04/14/17 2143

## 2017-04-11 NOTE — Discharge Instructions (Signed)
Alternate ice and heat to your lower back.  Avoid twisting or bending over.  Follow-up with your doctor for recheck in one week if not improving

## 2017-04-11 NOTE — ED Triage Notes (Signed)
Pt c/o left lower back pain x 3 weeks. Intermittently radiates into hip. Denies urinary changes. Has not tried any meds at home

## 2017-04-29 ENCOUNTER — Ambulatory Visit: Payer: BLUE CROSS/BLUE SHIELD | Admitting: Family Medicine

## 2017-04-30 ENCOUNTER — Encounter: Payer: Self-pay | Admitting: *Deleted

## 2017-04-30 ENCOUNTER — Encounter: Payer: Self-pay | Admitting: Family Medicine

## 2017-04-30 ENCOUNTER — Ambulatory Visit (INDEPENDENT_AMBULATORY_CARE_PROVIDER_SITE_OTHER): Payer: BLUE CROSS/BLUE SHIELD | Admitting: Family Medicine

## 2017-04-30 VITALS — BP 120/80 | HR 76 | Temp 98.3°F | Resp 16 | Wt 215.0 lb

## 2017-04-30 DIAGNOSIS — M79662 Pain in left lower leg: Secondary | ICD-10-CM

## 2017-04-30 DIAGNOSIS — Z91199 Patient's noncompliance with other medical treatment and regimen due to unspecified reason: Secondary | ICD-10-CM

## 2017-04-30 DIAGNOSIS — I82592 Chronic embolism and thrombosis of other specified deep vein of left lower extremity: Secondary | ICD-10-CM

## 2017-04-30 DIAGNOSIS — L740 Miliaria rubra: Secondary | ICD-10-CM

## 2017-04-30 DIAGNOSIS — Z9119 Patient's noncompliance with other medical treatment and regimen: Secondary | ICD-10-CM | POA: Diagnosis not present

## 2017-04-30 NOTE — Progress Notes (Signed)
Subjective:    Patient ID: Rhonda Hudson, female    DOB: 07-22-1956, 61 y.o.   MRN: [id number]  HPI Chief Complaint  Patient presents with  . left leg pain    left leg pain- constant pain. rash up leg but doesn't itch. has a big bruise near knee cap .   . left pain    left pain at hip . took robaxin but no relief. was given pain med but didn't take it   She is here with complaints of left calf pain that has been ongoing for several months. States pain has been present since diagnosis of her 2nd DVT in April.  She is taking Xarelto and denies any missed doses. Pain is keeping her up at night. States she missed work 2 nights ago due to pain.  States she has been taking Tylenol but ran out and can't afford to get anymore until she gets paid Friday.  States she cannot get a prescription for pain medication because her sons live with her and they are "drug addicts" and steal her medications. She does not want to take any medications except for Tylenol and Xarelto.   She has not noticed any bleeding. Denies fever, chills, chest pain, palpitations, shortness of breath.   States she has had a rash on her bilateral LE for 2-3 days. Rash is not pruritic. No new lotions or soaps. States this is related to heat. She works in a [employer], dispensing.   IMPRESSION: Isolated distal deep vein thrombosis involving the left gastrocnemius vein in the calf. The remainder of the deep venous system, including the remaining calf veins, is patent.  Electronically Signed   By: Evangeline Dakin M.D.   On: 01/13/2017 16:36    Review of Systems Pertinent positives and negatives in the history of present illness.     Objective:   Physical Exam  Constitutional: She is oriented to person, place, and time. She appears well-developed and well-nourished. No distress.  Cardiovascular: Normal rate, regular rhythm, normal heart sounds and intact distal pulses.   No LE edema  Pulmonary/Chest: Effort normal and  breath sounds normal.  Musculoskeletal:       Left knee: Normal.       Left ankle: Normal.       Left lower leg: She exhibits tenderness. She exhibits no bony tenderness, no swelling and no edema.  Left calf with profound tenderness to palpation. Calf is soft, no edema, normal pulses, sensation and ROM. Negative Homans.  Red rash in patches to bilateral LE, non pruritic.   Neurological: She is alert and oriented to person, place, and time. She has normal strength. No cranial nerve deficit or sensory deficit. Gait abnormal.  Skin: Skin is warm and dry. No pallor.  Psychiatric: She has a normal mood and affect. Her speech is normal and behavior is normal.   BP 120/80   Pulse 76   Temp 98.3 F (36.8 C) (Oral)   Resp 16   Wt 215 lb (97.5 kg)   SpO2 98%   BMI 31.75 kg/m       Assessment & Plan:  Pain of left calf  Personal history of noncompliance with medical treatment, presenting hazards to health  Chronic deep vein thrombosis (DVT) of other vein of left lower extremity (HCC)  Heat rash  Discussed that there is no evidence of compartment syndrome, obstruction or worsening DVT. She declines starting any new medications.  Xarelto samples #14 given to patient. Tylenol extra strength  samples #6 given.  Work note given for today. 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  She will return if she notices any new or worsening symptoms.  May consider starting her on gabapentin or nortriptyline in the future is she is changes her mind.

## 2017-05-11 ENCOUNTER — Encounter (HOSPITAL_COMMUNITY): Payer: Self-pay

## 2017-05-11 ENCOUNTER — Emergency Department (HOSPITAL_COMMUNITY): Payer: BLUE CROSS/BLUE SHIELD

## 2017-05-11 ENCOUNTER — Emergency Department (HOSPITAL_COMMUNITY)
Admission: EM | Admit: 2017-05-11 | Discharge: 2017-05-11 | Disposition: A | Discharge status: Home / Self Care | Payer: BLUE CROSS/BLUE SHIELD | Attending: Emergency Medicine | Admitting: Emergency Medicine

## 2017-05-11 DIAGNOSIS — F17210 Nicotine dependence, cigarettes, uncomplicated: Secondary | ICD-10-CM | POA: Insufficient documentation

## 2017-05-11 DIAGNOSIS — Z7901 Long term (current) use of anticoagulants: Secondary | ICD-10-CM | POA: Diagnosis not present

## 2017-05-11 DIAGNOSIS — M545 Low back pain, unspecified: Secondary | ICD-10-CM

## 2017-05-11 HISTORY — DX: Hereditary deficiency of other clotting factors: D68.2

## 2017-05-11 MED ORDER — HYDROMORPHONE HCL 1 MG/ML IJ SOLN
1.0000 mg | Freq: Once | INTRAMUSCULAR | Status: AC
  Administered 2017-05-11: 1 mg via INTRAMUSCULAR
  Filled 2017-05-11: qty 1

## 2017-05-11 MED ORDER — ONDANSETRON 8 MG PO TBDP
8.0000 mg | ORAL_TABLET | Freq: Once | ORAL | Status: AC
  Administered 2017-05-11: 8 mg via ORAL
  Filled 2017-05-11: qty 1

## 2017-05-11 MED ORDER — METHOCARBAMOL 500 MG PO TABS
500.0000 mg | ORAL_TABLET | Freq: Once | ORAL | Status: AC
  Administered 2017-05-11: 500 mg via ORAL
  Filled 2017-05-11: qty 1

## 2017-05-11 NOTE — ED Provider Notes (Signed)
[facility] DEPT Provider Note   CSN: [id number] Arrival date & time: 05/11/17  1242     History   Chief Complaint Chief Complaint  Patient presents with  . Back Pain    HPI Rhonda Hudson is a 61 y.o. female.  HPI  Rhonda Hudson is a 61 y.o. female who presents to the Emergency Department complaining of persistent low back pain for "weeks." she describes a sharp, constant pain to her left lower back.  Pain is constant, but worsens with movement, walking and bending over.  She has tried muscle relaxer without relief.  She was seen here previously for same.  She has not seen a PCP since previous visit.  She denies known injury, fever,chills, abdominal pain, numbness or pain to the lower extremities, urine or bowel changes.  Past Medical History:  Diagnosis Date  . Cataracts, bilateral   . Diverticulitis   . DVT (deep venous thrombosis) ([place])   . DVT of lower extremity (deep venous thrombosis) ([place]) 01/04/2016  . 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 Hiatal hernia with gastroesophageal reflux    disease  . Insomnia   . Irritable bowel syndrome   . Panic attacks    Hx of    Patient Active Problem List   Diagnosis Date Noted  . Smoker 01/14/2017  . Personal history of noncompliance with medical treatment, presenting hazards to health 01/14/2017  . Acute loss of vision, left 10/14/2016  . History of hypothyroidism 01/04/2016  . DVT of lower extremity (deep venous thrombosis) ([place]) 01/04/2016    Past Surgical History:  Procedure Laterality Date  . CHOLECYSTECTOMY    . TOE SURGERY    . TUBAL LIGATION    . US ECHOCARDIOGRAPHY  01-31-2009   EF 60%    OB History    No data available       Home Medications    Prior to Admission medications   Medication Sig Start Date End Date Taking? Authorizing Provider  rivaroxaban (XARELTO) 20 MG TABS tablet Take 1 tablet (20 mg total) by mouth daily with supper. 02/05/17   Girtha Rm, NP-C    Family History Family  History  Problem Relation Age of Onset  . Leukemia Father   . Leukemia Brother   . Multiple sclerosis Neg Hx   . Autoimmune disease Neg Hx     Social History Social History  Substance Use Topics  . Smoking status: Current Some Day Smoker    Packs/day: 0.50    Types: Cigarettes  . Smokeless tobacco: Never Used  . Alcohol use No     Allergies   Patient has no known allergies.   Review of Systems Review of Systems  Constitutional: Negative for fever.  Respiratory: Negative for shortness of breath.   Gastrointestinal: Negative for abdominal pain, constipation and vomiting.  Genitourinary: Negative for decreased urine volume, difficulty urinating, dysuria, flank pain and hematuria.  Musculoskeletal: Positive for back pain. Negative for joint swelling.  Skin: Negative for rash.  Neurological: Negative for weakness and numbness.  All other systems reviewed and are negative.    Physical Exam Updated Vital Signs BP (!) 124/59 (BP Location: Left Arm)   Pulse 82   Temp 98.3 F (36.8 C) (Oral)   Resp 18   Ht 5\' 9"  (1.753 m)   Wt 98 kg (216 lb)   SpO2 96%   BMI 31.90 kg/m   Physical Exam  Constitutional: She is oriented to person, place, and time. She appears well-developed  and well-nourished. No distress.  HENT:  Head: Normocephalic and atraumatic.  Neck: Normal range of motion. Neck supple.  Cardiovascular: Normal rate, regular rhythm and intact distal pulses.   No murmur heard. Pulmonary/Chest: Effort normal and breath sounds normal. No respiratory distress.  Abdominal: Soft. She exhibits no distension. There is no tenderness.  Musculoskeletal: She exhibits tenderness. She exhibits no edema.       Lumbar back: She exhibits tenderness and pain. She exhibits normal range of motion, no swelling, no deformity, no laceration and normal pulse.  ttp of the left lower lumbar spine and paraspinal muscles.   Pt has 5/5 strength against resistance of bilateral lower  extremities.     Neurological: She is alert and oriented to person, place, and time. She has normal strength. No sensory deficit. She exhibits normal muscle tone. Coordination and gait normal.  Reflex Scores:      Patellar reflexes are 2+ on the right side and 2+ on the left side.      Achilles reflexes are 2+ on the right side and 2+ on the left side. Skin: Skin is warm and dry. Capillary refill takes less than 2 seconds. No rash noted.  Nursing note and vitals reviewed.    ED Treatments / Results  Labs (all labs ordered are listed, but only abnormal results are displayed) Labs Reviewed - No data to display  EKG  EKG Interpretation None       Radiology Dg Lumbar Spine Complete  Result Date: 05/11/2017 CLINICAL DATA:  Severe left low back pain EXAM: LUMBAR SPINE - COMPLETE 4+ VIEW COMPARISON:  None. FINDINGS: Five lumbar-type vertebral bodies. Normal lumbar lordosis. No evidence of fracture or dislocation. Vertebral body heights are maintained. Mild degenerative changes of the lower thoracic spine and at L3-4. Visualized bony pelvis appears intact. Cholecystectomy clips. IMPRESSION: Negative. Electronically Signed   By: Julian Hy M.D.   On: 05/11/2017 14:20     Procedures Procedures (including critical care time)  Medications Ordered in ED Medications - No data to display   Initial Impression / Assessment and Plan / ED Course  I have reviewed the triage vital signs and the nursing notes.  Pertinent labs & imaging results that were available during my care of the patient were reviewed by me and considered in my medical decision making (see chart for details).     Pt ambulates with a slow but steady gait.  No focal neuro deficits.  Seen here recently for same. XR neg.  Doubt emergent neurological process. Pt declines prescription medication stating that she doesn't want prescriptions because her sons will steal her medications.    Feeling better after IM  medication.  Requesting discharge.advised to arrange PCP f/u   Final Clinical Impressions(s) / ED Diagnoses   Final diagnoses:  Acute left-sided low back pain without sciatica    New Prescriptions New Prescriptions   No medications on file     Bufford Lope 05/13/17 2211    Milton Ferguson, MD 05/15/17 1255

## 2017-05-11 NOTE — Discharge Instructions (Signed)
Apply ice or heat to your back.  Call your primary provider tomorrow to arrange a follow-up appt.  Return here for any worsening symptoms

## 2017-05-11 NOTE — ED Triage Notes (Signed)
Pt reports lower back pain that she has had for weeks. Pain left lower back . Pt has been seen and treated for this and was given anti inflammatory meds that didn't help.Pt reports difficulty with putting left leg down completely and bending over

## 2017-05-12 ENCOUNTER — Telehealth: Payer: Self-pay | Admitting: Family Medicine

## 2017-05-12 NOTE — Telephone Encounter (Signed)
Pt coming in Wednesday to discuss this

## 2017-05-12 NOTE — Telephone Encounter (Signed)
She will need a follow up visit with me to see if an MRI is needed and appropriate. If we find that it is needed then we can order it. I reviewed the notes from her ED visit.

## 2017-05-12 NOTE — Telephone Encounter (Signed)
Pt requesting that [name] set her up for a MRI of her lower back. She said the leg pains came back but then her lower back started hurting so bad that she had to go to the ER. PT was instructed to have [name] set her up for a MRI.

## 2017-05-14 ENCOUNTER — Encounter: Payer: Self-pay | Admitting: Family Medicine

## 2017-05-14 ENCOUNTER — Ambulatory Visit (INDEPENDENT_AMBULATORY_CARE_PROVIDER_SITE_OTHER): Payer: BLUE CROSS/BLUE SHIELD | Admitting: Family Medicine

## 2017-05-14 VITALS — BP 120/80 | HR 73 | Temp 97.8°F | Wt 215.0 lb

## 2017-05-14 DIAGNOSIS — M545 Low back pain, unspecified: Secondary | ICD-10-CM

## 2017-05-14 DIAGNOSIS — Z23 Encounter for immunization: Secondary | ICD-10-CM

## 2017-05-14 NOTE — Patient Instructions (Addendum)
Use heat and Tylenol. You can add a topical medication such as Arnicare gel or something similar. You could also try the lidocaine patches. Let me know if your pain is not improving or if you experience any new or worsening symptoms.   If you develop fever, numbness or neurological symptoms or cannot urinate for hours then that is a medical emergency.   Call me by Monday and let me know how you are doing.

## 2017-05-14 NOTE — Progress Notes (Signed)
   Subjective:    Patient ID: Rhonda Hudson, female    DOB: 09-Aug-1956, 61 y.o.   MRN: [id number]  HPI Chief Complaint  Patient presents with  . back pain    severe back pain- lower pain   She is here with complaints of non radiating low back pain off and on. Onset of pain was when she got up out of bed. No injury. Left low back pain that is limiting her range of motion.  Pain is sharp and takes her breath away with movement. Sitting upright improves pain. States she cannot lay down and has been having to sit upright in a chair to sleep.  She is on Xarelto for recurrent DVT.   Denies fever, chills, chest pain, palpitations, shortness of breath,  Denies numbness, tingling, or weakness.  Denies saddle anesthesia, urinary retention,      Review of Systems Pertinent positives and negatives in the history of present illness.     Objective:   Physical Exam  Constitutional: She is oriented to person, place, and time. She appears well-developed and well-nourished. No distress.  Eyes: Pupils are equal, round, and reactive to light. Conjunctivae are normal.  Neck: Normal range of motion. Neck supple.  Pulmonary/Chest: Effort normal and breath sounds normal.  Abdominal: Soft. Bowel sounds are normal. She exhibits no distension. There is no tenderness.  Musculoskeletal:       Thoracic back: Normal.       Lumbar back: She exhibits tenderness and pain. She exhibits normal range of motion, no bony tenderness, no swelling and no spasm.       Back:  Area is tender to light palpation. Pain with flexion, extension and rotation. No step off. No SI joint discomfort or tenderness.   Neurological: She is alert and oriented to person, place, and time. She has normal strength and normal reflexes. No cranial nerve deficit or sensory deficit. Gait abnormal. Coordination normal.  Negative straight leg  Skin: Skin is warm and dry. No rash noted. No pallor.  Psychiatric: Her speech is normal and behavior  is normal. Thought content normal.   BP 120/80   Pulse 73   Temp 97.8 F (36.6 C) (Oral)   Wt 215 lb (97.5 kg)   BMI 31.75 kg/m       Assessment & Plan:  Acute left-sided low back pain without sciatica  Needs flu shot - Plan: Flu Vaccine QUAD 36+ mos IM  Discussed treatment options. She is not interested in pain medication. Her neuro exam is negative. No red flag symptoms.   Use heat and Tylenol. You can add a topical medication such as Arnicare gel or something similar. You could also try the lidocaine patches. Let me know if your pain is not improving or if you experience any new or worsening symptoms.   If you develop fever, numbness or neurological symptoms or cannot urinate for hours then that is a medical emergency.   Call me by Monday and let me know how you are doing.

## 2017-05-22 ENCOUNTER — Encounter: Payer: Self-pay | Admitting: Family Medicine

## 2017-05-22 ENCOUNTER — Ambulatory Visit (INDEPENDENT_AMBULATORY_CARE_PROVIDER_SITE_OTHER): Payer: BLUE CROSS/BLUE SHIELD | Admitting: Family Medicine

## 2017-05-22 VITALS — BP 130/80 | HR 71 | Temp 98.0°F

## 2017-05-22 DIAGNOSIS — M47814 Spondylosis without myelopathy or radiculopathy, thoracic region: Secondary | ICD-10-CM | POA: Diagnosis not present

## 2017-05-22 DIAGNOSIS — Z87898 Personal history of other specified conditions: Secondary | ICD-10-CM | POA: Insufficient documentation

## 2017-05-22 DIAGNOSIS — Z7901 Long term (current) use of anticoagulants: Secondary | ICD-10-CM

## 2017-05-22 DIAGNOSIS — D682 Hereditary deficiency of other clotting factors: Secondary | ICD-10-CM | POA: Diagnosis not present

## 2017-05-22 DIAGNOSIS — R7303 Prediabetes: Secondary | ICD-10-CM

## 2017-05-22 DIAGNOSIS — R29898 Other symptoms and signs involving the musculoskeletal system: Secondary | ICD-10-CM

## 2017-05-22 DIAGNOSIS — M545 Low back pain, unspecified: Secondary | ICD-10-CM

## 2017-05-22 LAB — POCT URINALYSIS DIP (PROADVANTAGE DEVICE)
BILIRUBIN UA: NEGATIVE mg/dL
Bilirubin, UA: NEGATIVE
Blood, UA: NEGATIVE
Glucose, UA: NEGATIVE mg/dL
LEUKOCYTES UA: NEGATIVE
Nitrite, UA: NEGATIVE
PROTEIN UA: NEGATIVE mg/dL
Specific Gravity, Urine: 1.025
Urobilinogen, Ur: NEGATIVE
pH, UA: 6 (ref 5.0–8.0)

## 2017-05-22 NOTE — Progress Notes (Signed)
Subjective:    Patient ID: Rhonda Hudson, female    DOB: 07/22/1956, 61 y.o.   MRN: [id number]  HPI Chief Complaint  Patient presents with  . pain    terrible pain in back    She is here with reports of worsening left low back pain that started approximately in June and has progressively worsened over the past month. No known injury. Pain is non radiating, worse with movement and improved with sitting. States she cannot stand to touch the area due to pain and she cannot lie down.  States her left leg is now weaker than initially. She does have a history of DVT in her left calf and states she has been favoring her left leg for several weeks.   Denies history of back injury or surgery. States she has not had back issues in the past.   Denies fever, chills, chest pain, palpitations, shortness of breath, abdominal pain, N/V/D. No urinary symptoms.   Denies numbness or tingling.   She is a daily smoker with a long history of smoking. Does not plan on stopping.   History of prediabetes however she states she will "never accept that she has this".  Her diet is poor in nutrition. Drinks sodas regularly. Does not exercise.   Requests time off from work. States she is not able to work with her back pain.    Reviewed allergies, medications, past medical, surgical,  and social history.    Review of Systems Pertinent positives and negatives in the history of present illness.     Objective:   Physical Exam  Constitutional: She is oriented to person, place, and time. She appears well-developed and well-nourished. No distress.  Neck: Normal range of motion. Neck supple.  Cardiovascular: Normal rate, regular rhythm and intact distal pulses.   Pulmonary/Chest: Effort normal and breath sounds normal.  Abdominal: Soft. There is no tenderness.  Musculoskeletal:       Lumbar back: She exhibits tenderness. She exhibits no bony tenderness.       Back:  Severe tenderness, no erythema or  bruising. No muscle spasm.   Lymphadenopathy:    She has no cervical adenopathy.  Neurological: She is alert and oriented to person, place, and time. No cranial nerve deficit or sensory deficit. Gait abnormal. Coordination normal.  Reflex Scores:      Patellar reflexes are 2+ on the right side and 1+ on the left side. No LE edema.  No clonus  Skin: Skin is warm and dry. No bruising, no ecchymosis and no rash noted. No erythema. No pallor.   BP 130/80   Pulse 71   Temp 98 F (36.7 C) (Oral)    UA- negative      Assessment & Plan:  Acute left-sided low back pain without sciatica - Plan: MR Lumbar Spine Wo Contrast, Ambulatory referral to Orthopedic Surgery, CBC with Differential/Platelet, POCT Urinalysis DIP (Proadvantage Device), Basic metabolic panel  Left leg weakness - Plan: MR Lumbar Spine Wo Contrast, Ambulatory referral to Orthopedic Surgery  Prediabetes - Plan: Hemoglobin A1c  Osteoarthritis of thoracic spine, unspecified spinal osteoarthritis complication status  Factor II deficiency (HCC)  Chronic anticoagulation  She is obviously uncomfortable. Offered her Tramadol for pain, she declines. States she does not like to take medication.  She has tried multiple over the counter therapies such as Tylenol, topical analgesics, heat. She is taking Xarelto for recurrent DVT and factor II deficiency.  Plan to send her for a MRI and refer to  orthopedist for further evaluation. She does have a new onset of back pain after age 80 and left leg weakness. Symptoms are persistent and worsening. No other red flags at this time. UA neg.  No recent hemoglobin A1c. History of prediabetes. Will check an A1c today. She is adamant that she will not take any more medication even if she has diabetes. Discussed that she has that right.  Follow up pending MRI and labs.

## 2017-05-23 ENCOUNTER — Telehealth: Payer: Self-pay | Admitting: Family Medicine

## 2017-05-23 LAB — CBC WITH DIFFERENTIAL/PLATELET
Basophils Absolute: 47 cells/uL (ref 0–200)
Basophils Relative: 0.5 %
EOS ABS: 113 {cells}/uL (ref 15–500)
Eosinophils Relative: 1.2 %
HCT: 44 % (ref 35.0–45.0)
Hemoglobin: 14.7 g/dL (ref 11.7–15.5)
Lymphs Abs: 2557 cells/uL (ref 850–3900)
MCH: 29.2 pg (ref 27.0–33.0)
MCHC: 33.4 g/dL (ref 32.0–36.0)
MCV: 87.3 fL (ref 80.0–100.0)
MONOS PCT: 6.6 %
MPV: 11.7 fL (ref 7.5–12.5)
Neutro Abs: 6063 cells/uL (ref 1500–7800)
Neutrophils Relative %: 64.5 %
PLATELETS: 242 10*3/uL (ref 140–400)
RBC: 5.04 10*6/uL (ref 3.80–5.10)
RDW: 12.9 % (ref 11.0–15.0)
TOTAL LYMPHOCYTE: 27.2 %
WBC mixed population: 620 cells/uL (ref 200–950)
WBC: 9.4 10*3/uL (ref 3.8–10.8)

## 2017-05-23 LAB — BASIC METABOLIC PANEL WITH GFR
BUN: 10 mg/dL (ref 7–25)
CO2: 25 mmol/L (ref 20–32)
CREATININE: 0.8 mg/dL (ref 0.50–0.99)
Calcium: 9.7 mg/dL (ref 8.6–10.4)
Chloride: 103 mmol/L (ref 98–110)
GFR, EST AFRICAN AMERICAN: 91 mL/min/{1.73_m2} (ref >=60)
GFR, Est Non African American: 78 mL/min/{1.73_m2} (ref >=60)
Glucose, Bld: 103 mg/dL — ABNORMAL HIGH (ref 65–99)
Potassium: 4.4 mmol/L (ref 3.5–5.3)
SODIUM: 136 mmol/L (ref 135–146)

## 2017-05-23 LAB — HEMOGLOBIN A1C
HEMOGLOBIN A1C: 6.1 %{Hb} — AB (ref <5.7)
Mean Plasma Glucose: 128 (calc)
eAG (mmol/L): 7.1 (calc)

## 2017-05-23 NOTE — Telephone Encounter (Signed)
Forms dropped off for pt. Sending back to be completed. Per pt please fax when completed. She can be reached at [phone].

## 2017-06-02 ENCOUNTER — Telehealth: Payer: Self-pay | Admitting: Family Medicine

## 2017-06-02 ENCOUNTER — Ambulatory Visit
Admission: RE | Admit: 2017-06-02 | Discharge: 2017-06-02 | Disposition: A | Discharge status: Home / Self Care | Payer: BLUE CROSS/BLUE SHIELD | Source: Ambulatory Visit | Attending: Family Medicine | Admitting: Family Medicine

## 2017-06-02 DIAGNOSIS — M545 Low back pain, unspecified: Secondary | ICD-10-CM

## 2017-06-02 DIAGNOSIS — R29898 Other symptoms and signs involving the musculoskeletal system: Secondary | ICD-10-CM

## 2017-06-02 NOTE — Telephone Encounter (Signed)
Pt dropped off fmla paperwork to be filled out, put in your folder to be filled out, pt can be reached at [phone] please fax to [employer] when through and let her know when they are done, so she can have a copy of them

## 2017-06-04 NOTE — Telephone Encounter (Signed)
Called pt and advised her of [name]'s instructions. Pt will come by the office & pick up form.

## 2017-06-04 NOTE — Telephone Encounter (Signed)
Please call her and let her know that it would be best for her to take her disability paperwork to her orthopedist tomorrow. We need to get their input as to what her diagnosis is and the treatment plan. I will put these at the front desk for her. Thanks.

## 2017-06-05 ENCOUNTER — Telehealth (INDEPENDENT_AMBULATORY_CARE_PROVIDER_SITE_OTHER): Payer: Self-pay | Admitting: Radiology

## 2017-06-05 ENCOUNTER — Encounter (INDEPENDENT_AMBULATORY_CARE_PROVIDER_SITE_OTHER): Payer: Self-pay | Admitting: Orthopedic Surgery

## 2017-06-05 ENCOUNTER — Ambulatory Visit (INDEPENDENT_AMBULATORY_CARE_PROVIDER_SITE_OTHER): Payer: BLUE CROSS/BLUE SHIELD | Admitting: Orthopedic Surgery

## 2017-06-05 DIAGNOSIS — G8929 Other chronic pain: Secondary | ICD-10-CM

## 2017-06-05 DIAGNOSIS — M5442 Lumbago with sciatica, left side: Secondary | ICD-10-CM | POA: Diagnosis not present

## 2017-06-05 DIAGNOSIS — M545 Low back pain: Secondary | ICD-10-CM

## 2017-06-05 NOTE — Telephone Encounter (Signed)
Please advise 

## 2017-06-05 NOTE — Progress Notes (Signed)
Office Visit Note   Patient: Rhonda Hudson           Date of Birth: [birth year]           MRN: [id number] Visit Date: 06/05/2017 Requested by: Girtha Rm, NP-C [place], [place] [address] PCP: Girtha Rm, NP-C  Subjective: Chief Complaint  Patient presents with  . Lower Back - Pain    HPI: [name] is a 61 year old patient with low back pain.  She reports pain with radicular symptoms down the left leg.  She does have a history of factor to problems and she has a history of deep vein thrombosis. She is on Xarelto.  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  She works at [employer] which is standing and walking all night.  She reports left calf pain as well.  She has 2 sons at home who have drug addiction and she is the only breadwinner.  She cannot be off of her blood thinner.              ROS: All systems reviewed are negative as they relate to the chief complaint within the history of present illness.  Patient denies  fevers or chills.   Assessment & Plan: Visit Diagnoses:  1. Chronic left-sided low back pain with left-sided sciatica     Plan: impression is low back pain with facet arthritis and mild spinal listhesis. Normally we would try injection and potentially nerve root ablation.  This is the standard treatment option for facet arthritis.  In this case however because of her blood thinners we cannot do that.  We really don't have great options unless she can be off of her blood thinners for a period of time to consider nerve root block of ablation.  If she can be off her blood thinners I would have her consult with Dr. Ernestina Patches for consideration of that procedure.  Otherwise is not too much to do for Rhonda Hudson at this time  Follow-Up Instructions: Return if symptoms worsen or fail to improve.   Orders:  No orders of the defined types were placed in this encounter.  No orders of the defined  types were placed in this encounter.     Procedures: No procedures performed   Clinical Data: No additional findings.  Objective: Vital Signs: There were no vitals taken for this visit.  Physical Exam:   Constitutional: Patient appears well-developed HEENT:  Head: Normocephalic Eyes:EOM are normal Neck: Normal range of motion Cardiovascular: Normal rate Pulmonary/chest: Effort normal Neurologic: Patient is alert Skin: Skin is warm Psychiatric: Patient has normal mood and affect    Ortho Exam: orthopedic exam demonstrates some focal tenderness and spasm on the left-hand side just above the left posterior superior iliac crest.  Pain with forward and lateral bending is present.  Patient has a little bit of weakness to hip flexion on the left at 5 minus out of 5 compared to 5+ out of 5 on the right.  There is no groin pain with internal and external rotation of either hip.  Pedal pulses palpable.  Motor sensory function to both feet intact  Specialty Comments:  No specialty comments available.  Imaging: No results found.   PMFS History: Patient Active Problem List   Diagnosis Date Noted  . History of prediabetes   . 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 Smoker 01/14/2017  . Personal history of noncompliance with medical  treatment, presenting hazards to health 01/14/2017  . Acute loss of vision, left 10/14/2016  . History of hypothyroidism 01/04/2016  . DVT of lower extremity (deep venous thrombosis) ([place]) 01/04/2016   Past Medical History:  Diagnosis Date  . Cataracts, bilateral   . Diverticulitis   . DVT (deep venous thrombosis) ([place])   . DVT of lower extremity (deep venous thrombosis) ([place]) 01/04/2016  . 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 Hiatal hernia with gastroesophageal reflux    disease  . History of prediabetes   . Insomnia   . Irritable bowel syndrome   . Panic attacks    Hx of    Family History  Problem Relation Age of Onset  . Leukemia Father   . Leukemia  Brother   . Multiple sclerosis Neg Hx   . Autoimmune disease Neg Hx     Past Surgical History:  Procedure Laterality Date  . CHOLECYSTECTOMY    . TOE SURGERY    . TUBAL LIGATION    . US ECHOCARDIOGRAPHY  01-31-2009   EF 60%   Social History   Occupational History  . fabric factory    Social History Main Topics  . Smoking status: Current Some Day Smoker    Packs/day: 0.50    Types: Cigarettes  . Smokeless tobacco: Never Used  . Alcohol use No  . Drug use: No  . Sexual activity: Not on file

## 2017-06-05 NOTE — Telephone Encounter (Signed)
Left L4 or L5 TF esi, can hold xarelto 24 hours before and after ok

## 2017-06-05 NOTE — Telephone Encounter (Signed)
Ok per Dr. Ernestina Patches.

## 2017-06-05 NOTE — Telephone Encounter (Signed)
Will you please ask Dr Ernestina Patches if agreeable to see patient? If so I can put referral in. Thanks.

## 2017-06-05 NOTE — Progress Notes (Signed)
Pt was notified about stopping xarelto 24 hours prior to a ortho procedure and then restart 24 hours after ortho procedure is done

## 2017-06-05 NOTE — Telephone Encounter (Signed)
Please review and advise. Thanks.  

## 2017-06-05 NOTE — Telephone Encounter (Signed)
I called and talked to [facility].  She said that she looked it up and the patient could be off 24 hours ibefore shot and then wait 24 hours and started again.  She also said that if 48 hours was required prior to the injection that could  be done as well.I will run it by Dr. Ernestina Patches and then we can get her set up. Pls send her to him for consult

## 2017-06-05 NOTE — Telephone Encounter (Signed)
Patient called and states that per Dr. Marlou Sa he could not proceed until she was off the [place].  Patient called Dr. Raenette Rover and discussed this with her.  But Dr. Dr. Raenette Rover would like for Dr. Marlou Sa to call her at [phone].  Thansks

## 2017-06-05 NOTE — Progress Notes (Signed)
Let her know that I sent Dr. Marlou Sa a message and it is ok for her to stop Xarelto 24 hours prior to having a procedure and then she will restart it 24 hours after procedure is complete.

## 2017-06-06 NOTE — Telephone Encounter (Signed)
Scheduled

## 2017-06-06 NOTE — Addendum Note (Signed)
Addended byLaurann Montana on: 06/06/2017 08:23 AM   Modules accepted: Orders

## 2017-06-06 NOTE — Telephone Encounter (Signed)
I put order in for ESI.

## 2017-06-09 ENCOUNTER — Other Ambulatory Visit: Payer: Self-pay | Admitting: Family Medicine

## 2017-06-09 NOTE — Telephone Encounter (Signed)
Is this okay to refill? 

## 2017-06-09 NOTE — Telephone Encounter (Signed)
yes

## 2017-06-11 ENCOUNTER — Telehealth (INDEPENDENT_AMBULATORY_CARE_PROVIDER_SITE_OTHER): Payer: Self-pay | Admitting: Orthopedic Surgery

## 2017-06-11 NOTE — Telephone Encounter (Signed)
Received a request for records from [employer]. However, unable to process as we do not have an authorization on file to release records. I faxed back to [employer] that they need to send auth so we can process their request.

## 2017-06-17 ENCOUNTER — Telehealth: Payer: Self-pay | Admitting: Internal Medicine

## 2017-06-17 ENCOUNTER — Ambulatory Visit (INDEPENDENT_AMBULATORY_CARE_PROVIDER_SITE_OTHER): Payer: BLUE CROSS/BLUE SHIELD

## 2017-06-17 ENCOUNTER — Ambulatory Visit (INDEPENDENT_AMBULATORY_CARE_PROVIDER_SITE_OTHER): Payer: BLUE CROSS/BLUE SHIELD | Admitting: Physical Medicine and Rehabilitation

## 2017-06-17 VITALS — BP 137/74 | HR 86 | Temp 97.9°F

## 2017-06-17 DIAGNOSIS — M5416 Radiculopathy, lumbar region: Secondary | ICD-10-CM

## 2017-06-17 DIAGNOSIS — M47816 Spondylosis without myelopathy or radiculopathy, lumbar region: Secondary | ICD-10-CM

## 2017-06-17 MED ORDER — BETAMETHASONE SOD PHOS & ACET 6 (3-3) MG/ML IJ SUSP
12.0000 mg | Freq: Once | INTRAMUSCULAR | Status: AC
  Administered 2017-06-17: 12 mg

## 2017-06-17 MED ORDER — RIVAROXABAN 20 MG PO TABS: 20.0000 mg | ORAL_TABLET | Freq: Every day | ORAL | 0 refills | Status: DC

## 2017-06-17 MED ORDER — LIDOCAINE HCL (PF) 1 % IJ SOLN
2.0000 mL | Freq: Once | INTRAMUSCULAR | Status: AC
  Administered 2017-06-17: 2 mL

## 2017-06-17 MED ORDER — METHYLPREDNISOLONE ACETATE 80 MG/ML IJ SUSP: 80.0000 mg | Freq: Once | INTRAMUSCULAR | Status: DC

## 2017-06-17 MED ORDER — RIVAROXABAN 20 MG PO TABS: ORAL_TABLET | ORAL | 0 refills | Status: DC

## 2017-06-17 NOTE — Progress Notes (Deleted)
Patient here today for left L4-L5 TF. 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 She has had multiple blood clots.

## 2017-06-17 NOTE — Telephone Encounter (Signed)
ok 

## 2017-06-17 NOTE — Telephone Encounter (Signed)
Pt came by the office and states that she has been out of her xarelto 20mg  since last week. She had the procedure done today so she could take it

## 2017-06-17 NOTE — Telephone Encounter (Signed)
She had procedure done at orthopedics today so she hasn't been able to take it since Sunday but states shes suppose to start back tomorrow on it. Pt went to pharmacy to pick it up and told that it was going to be close to $500 but that's because she could only go to local pharmacy 3 times at $10.00 for xarelto then she will have to get 90 day at mail order. I will give her samples until [facility] can get them mailed to her.

## 2017-06-17 NOTE — Patient Instructions (Signed)

## 2017-06-18 NOTE — Procedures (Signed)
Ms. 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 She will resume Xarelto at her regular time.  Lumbar Facet Joint Intra-Articular Injection(s) with Fluoroscopic Guidance  Patient: Rhonda Hudson      Date of Birth: June 14, 1956 MRN: [id number] PCP: Girtha Rm, NP-C      Visit Date: 06/17/2017   Universal Protocol:    Date/Time: 06/17/2017  Consent Given By: the patient  Position: PRONE   Additional Comments: Vital signs were monitored before and after the procedure. Patient was prepped and draped in the usual sterile fashion. The correct patient, procedure, and site was verified.   Injection Procedure Details:  Procedure Site One Meds Administered:  Meds ordered this encounter  Medications  . lidocaine (PF) (XYLOCAINE) 1 % injection 2 mL  . betamethasone acetate-betamethasone sodium phosphate (CELESTONE) injection 12 mg  . DISCONTD: methylPREDNISolone acetate (DEPO-MEDROL) injection 80 mg     Laterality: Left  Location/Site:  L4-L5  Needle size: 22 guage  Needle type: Spinal  Needle Placement: Articular  Findings:  -Contrast Used: 0.5 mL iohexol 180 mg iodine/mL   -Comments: Excellent flow of contrast producing a partial arthrogram.  Procedure Details: The fluoroscope beam is vertically oriented in AP, and the inferior recess is visualized beneath the lower pole of the inferior apophyseal process, which represents the target point for needle insertion. When direct visualization is difficult the target point is located at the medial projection of the vertebral pedicle. The region overlying each aforementioned target is locally anesthetized with a 1 to 2 ml. volume of 1% Lidocaine without Epinephrine.   The spinal needle was inserted into each of the above mentioned facet joints using biplanar fluoroscopic guidance. A 0.25 to 0.5 ml. volume of Isovue-250 was injected and a partial facet  joint arthrogram was obtained. A single spot film was obtained of the resulting arthrogram.    One to 1.25 ml of the steroid/anesthetic solution was then injected into each of the facet joints noted above.   Additional Comments:  The patient tolerated the procedure well Dressing: Band-Aid    Post-procedure details: Patient was observed during the procedure. Post-procedure instructions were reviewed.  Patient left the clinic in stable condition.

## 2017-06-30 ENCOUNTER — Telehealth (INDEPENDENT_AMBULATORY_CARE_PROVIDER_SITE_OTHER): Payer: Self-pay | Admitting: Orthopedic Surgery

## 2017-06-30 NOTE — Telephone Encounter (Signed)
Patient called wanting to know if Dr Marlou Sa can release her to go back to work tonight. Please call patient as she wants to know what to do

## 2017-06-30 NOTE — Telephone Encounter (Signed)
Received request for records from [employer]. We still do not have an authorization. I faxed back advising so and that the auth is needed to process their request.

## 2017-07-01 NOTE — Telephone Encounter (Signed)
Please advise. Ok for release?

## 2017-07-01 NOTE — Telephone Encounter (Signed)
Please called pt as soon as possible

## 2017-07-02 NOTE — Telephone Encounter (Signed)
Okay to return to work

## 2017-07-02 NOTE — Telephone Encounter (Signed)
IC advised could pick up at front desk.  

## 2017-07-10 ENCOUNTER — Emergency Department (HOSPITAL_COMMUNITY)
Admission: EM | Admit: 2017-07-10 | Discharge: 2017-07-10 | Disposition: A | Discharge status: Home / Self Care | Payer: BLUE CROSS/BLUE SHIELD | Attending: Emergency Medicine | Admitting: Emergency Medicine

## 2017-07-10 ENCOUNTER — Encounter (HOSPITAL_COMMUNITY): Payer: Self-pay | Admitting: Emergency Medicine

## 2017-07-10 DIAGNOSIS — E039 Hypothyroidism, unspecified: Secondary | ICD-10-CM | POA: Insufficient documentation

## 2017-07-10 DIAGNOSIS — Z86718 Personal history of other venous thrombosis and embolism: Secondary | ICD-10-CM | POA: Insufficient documentation

## 2017-07-10 DIAGNOSIS — F17200 Nicotine dependence, unspecified, uncomplicated: Secondary | ICD-10-CM | POA: Insufficient documentation

## 2017-07-10 DIAGNOSIS — Z7901 Long term (current) use of anticoagulants: Secondary | ICD-10-CM | POA: Diagnosis not present

## 2017-07-10 DIAGNOSIS — I83899 Varicose veins of unspecified lower extremities with other complications: Secondary | ICD-10-CM | POA: Diagnosis present

## 2017-07-10 NOTE — ED Provider Notes (Signed)
[facility] EMERGENCY DEPARTMENT Provider Note   CSN: [id number] Arrival date & time: 07/10/17  1758     History   Chief Complaint Chief Complaint  Patient presents with  . Coagulation Disorder    HPI Rhonda Hudson is a 61 y.o. female. Chief complaint is leg bleeding.   HPI 61 year old female with a history of protein deficiency and coagulopathy. She is on lifetime Xarelto. She was scratching her leg this morning in the shower. She started bleeding from a varicose vein. EMS put a dressing on it. Sh started soaking through the dressing tonight and she presents here. .  Past Medical History:  Diagnosis Date  . Cataracts, bilateral   . Diverticulitis   . DVT (deep venous thrombosis) ([place])   . DVT of lower extremity (deep venous thrombosis) ([place]) 01/04/2016  . 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 Hiatal hernia with gastroesophageal reflux    disease  . History of prediabetes   . Insomnia   . Irritable bowel syndrome   . Panic attacks    Hx of    Patient Active Problem List   Diagnosis Date Noted  . History of prediabetes   . 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 Smoker 01/14/2017  . Personal history of noncompliance with medical treatment, presenting hazards to health 01/14/2017  . Acute loss of vision, left 10/14/2016  . History of hypothyroidism 01/04/2016  . DVT of lower extremity (deep venous thrombosis) ([place]) 01/04/2016    Past Surgical History:  Procedure Laterality Date  . CHOLECYSTECTOMY    . TOE SURGERY    . TUBAL LIGATION    . US ECHOCARDIOGRAPHY  01-31-2009   EF 60%    OB History    No data available       Home Medications    Prior to Admission medications   Medication Sig Start Date End Date Taking? Authorizing Provider  rivaroxaban (XARELTO) 20 MG TABS tablet TAKE 1 TABLET(20 MG) BY MOUTH DAILY WITH SUPPER 06/17/17   Henson, [name] L, NP-C  rivaroxaban (XARELTO) 20 MG TABS tablet Take 1 tablet (20 mg total) by mouth daily with supper. 06/17/17   Girtha Rm, NP-C    Family History Family History  Problem Relation Age of Onset  . Leukemia Father   . Leukemia Brother   . Multiple sclerosis Neg Hx   . Autoimmune disease Neg Hx     Social History Social History  Substance Use Topics  . Smoking status: Current Some Day Smoker    Packs/day: 0.50    Types: Cigarettes  . Smokeless tobacco: Never Used  . Alcohol use No     Allergies   Patient has no known allergies.   Review of Systems Review of Systems  Constitutional: Negative for appetite change, chills, diaphoresis, fatigue and fever.  HENT: Negative for mouth sores, sore throat and trouble swallowing.   Eyes: Negative for visual disturbance.  Respiratory: Negative for cough, chest tightness, shortness of breath and wheezing.   Cardiovascular: Negative for chest pain.  Gastrointestinal: Negative for abdominal distention, abdominal pain, diarrhea, nausea and vomiting.  Endocrine: Negative for polydipsia, polyphagia and polyuria.  Genitourinary: Negative for dysuria, frequency and hematuria.  Musculoskeletal: Negative for gait problem.  Skin: Negative for color change, pallor and rash.  Neurological: Negative for dizziness, syncope, light-headedness and headaches.  Hematological: Bruises/bleeds easily.  Psychiatric/Behavioral: Negative for behavioral problems and confusion.     Physical Exam Updated Vital Signs BP 96/62 (BP Location: Right Arm)  Pulse 81   Temp 98.3 F (36.8 C) (Oral)   Resp 18   Ht 5\' 9"  (1.753 m)   Wt 97.5 kg (215 lb)   SpO2 97%   BMI 31.75 kg/m   Physical Exam  Constitutional: She is oriented to person, place, and time. She appears well-developed and well-nourished. No distress.  HENT:  Head: Normocephalic.  Eyes: Pupils are equal, round, and reactive to light. Conjunctivae are normal. No scleral icterus.  Neck: Normal range of motion. Neck supple. No thyromegaly present.  Cardiovascular: Normal rate and regular rhythm.  Exam reveals  no gallop and no friction rub.   No murmur heard. Pulmonary/Chest: Effort normal and breath sounds normal. No respiratory distress. She has no wheezes. She has no rales.  Abdominal: Soft. Bowel sounds are normal. She exhibits no distension. There is no tenderness. There is no rebound.  Musculoskeletal: Normal range of motion.  Neurological: She is alert and oriented to person, place, and time.  Skin: Skin is warm and dry. No rash noted.  Punctate area of bleeding from a varicose vein that isPunctate area of bleeding from a varicose vein that is prominent when dressing is removed.  No ulceration  Psychiatric: She has a normal mood and affect. Her behavior is normal.     ED Treatments / Results  Labs (all labs ordered are listed, but only abnormal results are displayed) Labs Reviewed - No data to display  EKG  EKG Interpretation None       Radiology No results found.  Procedures Procedures (including critical care time)  Medications Ordered in ED Medications - No data to display   Initial Impression / Assessment and Plan / ED Course  I have reviewed the triage vital signs and the nursing notes.  Pertinent labs & imaging results that were available during my care of the patient were reviewed by me and considered in my medical decision making (see chart for details).     LACERATION REPAIR Performed by: Lolita Patella Authorized by: Lolita Patella Consent: Verbal consent obtained. Risks and benefits: risks, benefits and alternatives were discussed Consent given by: patient Patient identity confirmed: provided demographic data Prepped and Draped in normal sterile fashion Wound explored  Laceration Location: LL Leg  Laceration Length: 0.2cm  No Foreign Bodies seen or palpated  Anesthesia: local infiltration  Local anesthetic: lidocaine 1% c epinephrine  Anesthetic total: 3 ml  Irrigation method: syringe Amount of cleaning: standard  Skin closure: 2  Opposing Horizontal mattress sutures in a purse string  Number of sutures: 2  Technique: Horizontal mattress  Patient tolerance: Patient tolerated the procedure well with no immediate complications.   Final Clinical Impressions(s) / ED Diagnoses   Final diagnoses:  Bleeding from varicose vein    SR in 3-5 days  New Prescriptions New Prescriptions   No medications on file     Tanna Furry, MD 07/10/17 1921

## 2017-07-10 NOTE — ED Triage Notes (Signed)
Patient states she take Xarelto due to DVTs in legs. Patient states she started bleeding from her left lower leg and called EMS because it would stop. They managed to get the bleeding under control around 0900. She states it started bleeding again around 1730.  There is a small pin hole like wound in the vein of her left lower lateral leg. Pressure of the blood is making it shoot out about 14". Wound is pressure wrapped.

## 2017-07-10 NOTE — Discharge Instructions (Signed)
Suture removal in 2-5 days

## 2017-07-15 ENCOUNTER — Ambulatory Visit (INDEPENDENT_AMBULATORY_CARE_PROVIDER_SITE_OTHER): Payer: BLUE CROSS/BLUE SHIELD | Admitting: Family Medicine

## 2017-07-15 ENCOUNTER — Encounter: Payer: Self-pay | Admitting: Family Medicine

## 2017-07-15 VITALS — BP 120/70 | HR 71 | Temp 97.7°F

## 2017-07-15 DIAGNOSIS — Z4802 Encounter for removal of sutures: Secondary | ICD-10-CM

## 2017-07-15 DIAGNOSIS — Z7901 Long term (current) use of anticoagulants: Secondary | ICD-10-CM

## 2017-07-15 HISTORY — DX: Long term (current) use of anticoagulants: Z79.01

## 2017-07-15 NOTE — Progress Notes (Signed)
   Subjective:    Patient ID: Rhonda Hudson, female    DOB: 02-20-1956, 61 y.o.   MRN: [id number]  HPI Chief Complaint  Patient presents with  . remove stitches    remove stitches from left leg   She is here for suture removal. Sutures were put in on 07/10/2017 in the [facility] ED for varicose vein bleeding. 2 horizontal mattress sutures were used to close a wound on her left lateral lower leg. Denies fever, chills or pain.  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   No other concerns. No sign of bleeding.   Reviewed allergies, medications, past medical, surgical, and social history.    Review of Systems Pertinent positives and negatives in the history of present illness.     Objective:   Physical Exam  Constitutional: She appears well-developed and well-nourished. No distress.  Skin: Skin is warm and dry. No pallor.     Left lateral lower leg with 2 sutures in a horizontal, square like fashion with tissue well approximated. No surrounding erythema, drainage or sign of infection.    BP 120/70   Pulse 71   Temp 97.7 F (36.5 C) (Oral) '      Assessment & Plan:  Visit for suture removal  Chronic anticoagulation  The area was cleaned with alcohol and betadine. 2 mattresses sutures were removed without any significant bleeding or issues. Patient tolerated procedure well. The area was cleaned again and bacitracin applied. Sterile gauze with a compression wrap applied. She will keep the area clean and dry and use bacitracin for the next 2-3 days and return as needed.  Discussed that she is at an increased risk for bleeding due to Xarelto and recommend she use caution.

## 2017-07-18 ENCOUNTER — Telehealth (INDEPENDENT_AMBULATORY_CARE_PROVIDER_SITE_OTHER): Payer: Self-pay | Admitting: Orthopedic Surgery

## 2017-07-18 NOTE — Telephone Encounter (Signed)
Please advise if ok for note. If so, how long?  I advised patient would not hear back from [place] until sometime on Monday.

## 2017-07-18 NOTE — Telephone Encounter (Signed)
Patient called saying that the injection that she received from Dr. Ernestina Patches only helped for a few weeks, she's been trying to work but she says its juts too unbearable and she was wondering if Dr. Marlou Sa could write her out of work for a while. CB # [phone]

## 2017-07-21 NOTE — Telephone Encounter (Signed)
the best I can do is write her out for 1 week.  If she needs more work time out and that she will need to go to her primary care provider.  At this point if she can't have injections and they're not working and she doesn't want surgery then I don't have that much to offer her.

## 2017-07-21 NOTE — Telephone Encounter (Signed)
I tried calling patient to discuss. No answer. LMVM for her to call me back to discuss option of what Dr Marlou Sa stated.

## 2017-08-05 ENCOUNTER — Telehealth: Payer: Self-pay | Admitting: Family Medicine

## 2017-08-05 NOTE — Telephone Encounter (Signed)
Recv'd fax from [employer] pt's disability administration requesting copy office visits form 07/05/17 to present & estimated return to work date, referred to [employer]

## 2017-08-06 ENCOUNTER — Telehealth (INDEPENDENT_AMBULATORY_CARE_PROVIDER_SITE_OTHER): Payer: Self-pay | Admitting: Orthopedic Surgery

## 2017-08-06 ENCOUNTER — Ambulatory Visit (INDEPENDENT_AMBULATORY_CARE_PROVIDER_SITE_OTHER): Payer: BLUE CROSS/BLUE SHIELD | Admitting: Orthopedic Surgery

## 2017-08-06 ENCOUNTER — Encounter (INDEPENDENT_AMBULATORY_CARE_PROVIDER_SITE_OTHER): Payer: Self-pay | Admitting: Orthopedic Surgery

## 2017-08-06 DIAGNOSIS — M545 Low back pain: Secondary | ICD-10-CM | POA: Diagnosis not present

## 2017-08-06 DIAGNOSIS — G8929 Other chronic pain: Secondary | ICD-10-CM

## 2017-08-06 NOTE — Progress Notes (Signed)
Office Visit Note   Patient: Rhonda Hudson           Date of Birth: 1955/11/07           MRN: [id number] Visit Date: 08/06/2017 Requested by: Girtha Rm, NP-C [place], [place] [address] PCP: Girtha Rm, NP-C  Subjective: Chief Complaint  Patient presents with  . Lower Back - Pain    HPI: Rhonda Hudson is a 61 year old patient with L4-5 facet arthritis which is severe.  She had an injection which was a facet injection with Dr. Hervey Ard October 2 which gave her one week of near complete relief.  Symptoms have recurred.  Pain will wake her from sleep at night and are actually keeping her from working.  She's had to go on short-term disability.  She's not taking any medication for pain.  She has factor II problem and she takes Xarelto for that.  She states that in regards to her job she "can't do it"              ROS: All systems reviewed are negative as they relate to the chief complaint within the history of present illness.  Patient denies  fevers or chills.   Assessment & Plan: Visit Diagnoses:  1. Chronic low back pain, unspecified back pain laterality, with sciatica presence unspecified     Plan: Impression is L4-5 facet arthritis which is severely painful for the patient.  She had great relief for 1 week with facet injections.  I think she is a great candidate for thermal ablation of her facet nerves.  I'll send her back to Dr. Alfonse Spruce for consideration for that procedure.  We'll keep her out of work for 4 weeks while all this is being sorted out.  I will hand every care of the patient to Dr. Ernestina Patches for further management  Follow-Up Instructions: No Follow-up on file.   Orders:  Orders Placed This Encounter  Procedures  . Ambulatory referral to Physical Medicine Rehab   No orders of the defined types were placed in this encounter.     Procedures: No procedures performed   Clinical Data: No additional findings.  Objective: Vital Signs: There were  no vitals taken for this visit.  Physical Exam:   Constitutional: Patient appears well-developed HEENT:  Head: Normocephalic Eyes:EOM are normal Neck: Normal range of motion Cardiovascular: Normal rate Pulmonary/chest: Effort normal Neurologic: Patient is alert Skin: Skin is warm Psychiatric: Patient has normal mood and affect    Ortho Exam: Orthopedic exam demonstrates no nerve retention signs good ankle dorsi flexion plantar flexion quite itching strength negative Babinski palpable pedal pulses no groin pain interlocks rotation leg no definite paresthesias L1 S1 bilaterally and no hip flexor weakness she does have some pain with forward lateral bending.  Specialty Comments:  No specialty comments available.  Imaging: No results found.   PMFS History: Patient Active Problem List   Diagnosis Date Noted  . Chronic anticoagulation 07/15/2017  . History of prediabetes   . 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 Smoker 01/14/2017  . Personal history of noncompliance with medical treatment, presenting hazards to health 01/14/2017  . Acute loss of vision, left 10/14/2016  . History of hypothyroidism 01/04/2016  . DVT of lower extremity (deep venous thrombosis) ([place]) 01/04/2016   Past Medical History:  Diagnosis Date  . Cataracts, bilateral   . Chronic anticoagulation 07/15/2017  . Diverticulitis   . DVT (deep venous thrombosis) ([place])   . DVT of  lower extremity (deep venous thrombosis) ([place]) 01/04/2016  . 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 Hiatal hernia with gastroesophageal reflux    disease  . History of prediabetes   . Insomnia   . Irritable bowel syndrome   . Panic attacks    Hx of    Family History  Problem Relation Age of Onset  . Leukemia Father   . Leukemia Brother   . Multiple sclerosis Neg Hx   . Autoimmune disease Neg Hx     Past Surgical History:  Procedure Laterality Date  . CHOLECYSTECTOMY    . TOE SURGERY    . TUBAL LIGATION    . US ECHOCARDIOGRAPHY   01-31-2009   EF 60%   Social History   Occupational History  . Occupation: Statistician  Tobacco Use  . Smoking status: Current Some Day Smoker    Packs/day: 0.50    Types: Cigarettes  . Smokeless tobacco: Never Used  Substance and Sexual Activity  . Alcohol use: No    Alcohol/week: 0.0 oz  . Drug use: No  . Sexual activity: Not on file

## 2017-08-06 NOTE — Telephone Encounter (Signed)
faxed

## 2017-08-06 NOTE — Telephone Encounter (Signed)
Please advise. [place] for note? If so, how long do you want me to write it for?

## 2017-08-06 NOTE — Telephone Encounter (Signed)
Patient stopped by the front desk stating that she is needing a note faxed to her employer stating why she is out of work and for how long.  She needs it sent to [facility] at [place], Fax # [phone].  Thank you

## 2017-08-06 NOTE — Telephone Encounter (Signed)
Out of work 4 weeks back arthritis

## 2017-08-25 ENCOUNTER — Encounter (INDEPENDENT_AMBULATORY_CARE_PROVIDER_SITE_OTHER): Payer: BLUE CROSS/BLUE SHIELD | Admitting: Physical Medicine and Rehabilitation

## 2017-09-01 ENCOUNTER — Ambulatory Visit (INDEPENDENT_AMBULATORY_CARE_PROVIDER_SITE_OTHER): Payer: BLUE CROSS/BLUE SHIELD | Admitting: Physical Medicine and Rehabilitation

## 2017-09-01 ENCOUNTER — Ambulatory Visit (INDEPENDENT_AMBULATORY_CARE_PROVIDER_SITE_OTHER): Payer: BLUE CROSS/BLUE SHIELD

## 2017-09-01 ENCOUNTER — Encounter (INDEPENDENT_AMBULATORY_CARE_PROVIDER_SITE_OTHER): Payer: Self-pay | Admitting: Physical Medicine and Rehabilitation

## 2017-09-01 VITALS — BP 143/86 | Temp 98.0°F

## 2017-09-01 DIAGNOSIS — M47816 Spondylosis without myelopathy or radiculopathy, lumbar region: Secondary | ICD-10-CM

## 2017-09-01 MED ORDER — LIDOCAINE HCL (PF) 1 % IJ SOLN
2.0000 mL | Freq: Once | INTRAMUSCULAR | Status: AC
  Administered 2017-09-01: 2 mL

## 2017-09-01 MED ORDER — BETAMETHASONE SOD PHOS & ACET 6 (3-3) MG/ML IJ SUSP
12.0000 mg | Freq: Once | INTRAMUSCULAR | Status: AC
  Administered 2017-09-01: 12 mg

## 2017-09-01 NOTE — Progress Notes (Deleted)
Pt states pain in left lower back. Pt states pain is always present. +Driver, +BT (xarelto) Pt stated she was told it was ok to be on, -Dye Allergy.

## 2017-09-01 NOTE — Patient Instructions (Signed)

## 2017-09-03 ENCOUNTER — Other Ambulatory Visit: Payer: Self-pay | Admitting: Family Medicine

## 2017-09-04 ENCOUNTER — Telehealth: Payer: Self-pay | Admitting: Family Medicine

## 2017-09-04 NOTE — Telephone Encounter (Signed)
Patient advised.

## 2017-09-04 NOTE — Telephone Encounter (Signed)
I have not prescribed anything in the past for her for anxiety.  I cannot prescribe a medication for her without seeing her.

## 2017-09-04 NOTE — Telephone Encounter (Signed)
Pt called and is requesting something to help with her nerves, she would not tell me what it was about, just sates that she needed something to help calm her down, she did not have time to come in for a appt, pt can be reached at [phone], pt uses [facility] [place], [place] [address]

## 2017-09-11 ENCOUNTER — Encounter: Payer: Self-pay | Admitting: Family Medicine

## 2017-09-11 ENCOUNTER — Ambulatory Visit: Payer: BLUE CROSS/BLUE SHIELD | Admitting: Family Medicine

## 2017-09-11 VITALS — BP 124/80 | HR 85

## 2017-09-11 DIAGNOSIS — M79662 Pain in left lower leg: Secondary | ICD-10-CM | POA: Diagnosis not present

## 2017-09-11 DIAGNOSIS — M25562 Pain in left knee: Secondary | ICD-10-CM | POA: Diagnosis not present

## 2017-09-11 NOTE — Patient Instructions (Signed)
Call and see if you can be seen at Dr. Randel Pigg office sooner than your appointment.

## 2017-09-11 NOTE — Progress Notes (Signed)
   Subjective:    Patient ID: Rhonda Hudson, female    DOB: April 27, 1956, 61 y.o.   MRN: [id number]  HPI Chief Complaint  Patient presents with  . Acute Visit    left leg, constant pain when walking (started thursday night)(   She is here with complaints of left posterior knee and left upper calf pain for the past week. Denies injury.  Pain is constant and worse with standing for long periods and walking. States she cannot walk up and down steps with that leg. Relieved when elevating leg. Taking Tylenol.  History of DVT in left calf. She is taking Xarelto daily. Denies missing any doses.   States she has an appointment with Dr. Marlou Sa January 10th for left knee pain.   Denies fever, chills, dizziness, chest pain, palpitations, shortness of breath, cough, abdominal pain, N/V/D, LE edema.   No numbness, tingling or weakness.  Denies knee locking, popping or giving away.      Review of Systems Pertinent positives and negatives in the history of present illness.     Objective:   Physical Exam  Constitutional: She appears well-developed and well-nourished. No distress.  Cardiovascular: Normal rate, regular rhythm, normal heart sounds and intact distal pulses.  Pulmonary/Chest: Effort normal and breath sounds normal.  Musculoskeletal:       Left knee: She exhibits decreased range of motion. She exhibits no swelling, no effusion and no erythema. Tenderness found.  Posterior and medial joint line tenderness. Left calf tender without erythema, edema or increased warmth compared to right calf. Normal color, sensation, pulses and capillary refill. Temperature is equal to RLE. No pedal edema.   Refuses to get on exam table. Cannot fully access due to patient discomfort.   Neurological: Gait abnormal.   BP 124/80 (BP Location: Left Arm, Patient Position: Sitting)   Pulse 85   SpO2 97%       Assessment & Plan:  Acute pain of left knee  Pain of left calf  Offered to send her for XR of  left knee and she declines.  Discussed that she does not appear to have an infectious process and no sign of obstruction in LLE. Reports good compliance with Xarelto.  Discussed options for pain management including rest, elevation, ice, and Tylenol. Patient is adamant that she cannot have medication in her home stronger than Tylenol or her sons will "steal them". Offered to call her ortho office and see if we can get her in sooner than January for knee pain and she does not want [place] to do this. States she will call Dr. Randel Pigg office and check for an earlier appointment.

## 2017-09-15 ENCOUNTER — Telehealth: Payer: Self-pay

## 2017-09-15 NOTE — Procedures (Signed)
[name] is a 61 year old female with chronic worsening left axial low back pain.  She is followed by Dr. Marlou Sa who is her managing orthopedic surgeon and who is managing her work restrictions.  We completed facet/medial branch block in October and those notes can be reviewed.  She had almost 100% relief of her pain for about a week.  The pain did return.  She has no radicular leg pain.  MRI imaging reviewed again shows mainly facet arthropathy left more than right at L4-5.  We are going to repeat a double block paradigm of the L4-5 medial branches.  This would be the L3 and L4 medial branches.  She gets good relief with this we would look at possibly radiofrequency ablation. Lumbar Diagnostic Facet Joint Nerve Block with Fluoroscopic Guidance   Patient: Rhonda Hudson      Date of Birth: November 26, 1955 MRN: [id number] PCP: Rhonda Rm, NP-C      Visit Date: 09/01/2017   Universal Protocol:    Date/Time: 12/31/186:16 AM  Consent Given By: the patient  Position: PRONE  Additional Comments: Vital signs were monitored before and after the procedure. Patient was prepped and draped in the usual sterile fashion. The correct patient, procedure, and site was verified.   Injection Procedure Details:  Procedure Site One Meds Administered:  Meds ordered this encounter  Medications  . lidocaine (PF) (XYLOCAINE) 1 % injection 2 mL  . betamethasone acetate-betamethasone sodium phosphate (CELESTONE) injection 12 mg     Laterality: Left  Location/Site:  L4-L5  Needle size: 22 ga.  Needle type:spinal  Needle Placement: Oblique pedical  Findings:   -Comments: There was excellent flow of contrast along the articular pillars without intravascular flow.  Procedure Details: The fluoroscope beam is vertically oriented in AP and then obliqued 15 to 20 degrees to the ipsilateral side of the desired nerve to achieve the "Scotty dog" appearance.  The skin over the target area of the junction  of the superior articulating process and the transverse process (sacral ala if blocking the L5 dorsal rami) was locally anesthetized with a 1 ml volume of 1% Lidocaine without Epinephrine.  The spinal needle was inserted and advanced in a trajectory view down to the target.   After contact with periosteum and negative aspirate for blood and CSF, correct placement without intravascular or epidural spread was confirmed by injecting 0.5 ml. of Isovue-250.  A spot radiograph was obtained of this image.    Next, a 0.5 ml. volume of the injectate described above was injected. The needle was then redirected to the other facet joint nerves mentioned above if needed.  Prior to the procedure, the patient was given a Pain Diary which was completed for baseline measurements.  After the procedure, the patient rated their pain every 30 minutes and will continue rating at this frequency for a total of 5 hours.  The patient has been asked to complete the Diary and return to [place] by mail, fax or hand delivered as soon as possible.   Additional Comments:  The patient tolerated the procedure well Dressing: Band-Aid    Post-procedure details: Patient was observed during the procedure. Post-procedure instructions were reviewed.  Patient left the clinic in stable condition.

## 2017-09-15 NOTE — Telephone Encounter (Signed)
Records sent to [facility]  [place],  [place] 09-15-17

## 2017-09-25 ENCOUNTER — Ambulatory Visit (INDEPENDENT_AMBULATORY_CARE_PROVIDER_SITE_OTHER): Payer: BLUE CROSS/BLUE SHIELD | Admitting: Orthopedic Surgery

## 2017-09-25 ENCOUNTER — Ambulatory Visit (INDEPENDENT_AMBULATORY_CARE_PROVIDER_SITE_OTHER): Payer: BLUE CROSS/BLUE SHIELD

## 2017-09-25 ENCOUNTER — Encounter (INDEPENDENT_AMBULATORY_CARE_PROVIDER_SITE_OTHER): Payer: Self-pay | Admitting: Orthopedic Surgery

## 2017-09-25 DIAGNOSIS — M25562 Pain in left knee: Secondary | ICD-10-CM | POA: Diagnosis not present

## 2017-09-25 DIAGNOSIS — G8929 Other chronic pain: Secondary | ICD-10-CM | POA: Diagnosis not present

## 2017-09-25 MED ORDER — BUPIVACAINE HCL 0.25 % IJ SOLN
4.0000 mL | INTRAMUSCULAR | Status: AC | PRN
  Administered 2017-09-25: 4 mL via INTRA_ARTICULAR

## 2017-09-25 MED ORDER — LIDOCAINE HCL 1 % IJ SOLN
5.0000 mL | INTRAMUSCULAR | Status: AC | PRN
  Administered 2017-09-25: 5 mL

## 2017-09-25 MED ORDER — METHYLPREDNISOLONE ACETATE 40 MG/ML IJ SUSP
40.0000 mg | INTRAMUSCULAR | Status: AC | PRN
  Administered 2017-09-25: 40 mg via INTRA_ARTICULAR

## 2017-09-25 NOTE — Progress Notes (Signed)
Office Visit Note   Patient: Rhonda Hudson           Date of Birth: Jan 09, 1956           MRN: [id number] Visit Date: 09/25/2017 Requested by: Rhonda Rm, NP-C [place], [place] [address] PCP: Rhonda Rm, NP-C  Subjective: Chief Complaint  Patient presents with  . Left Knee - Follow-up    HPI: [name] is a 62 year old female with left knee pain.  She has a history of having back issues and is on Xarelto for chronic blood clots.  She has had back injections.  She reports some episodes of giving way in the left knee.  She also describes having a bruise on the medial aspect of her knee.  Denies any trauma to the knee.              ROS: All systems reviewed are negative as they relate to the chief complaint within the history of present illness.  Patient denies  fevers or chills.   Assessment & Plan: Visit Diagnoses:  1. Chronic pain of left knee     Plan: Impression is left knee pain with giving way in a patient who has normal radiographs of the knee and a normal exam.  This may represent meniscal pathology which is degenerative in nature.  Plan is to inject the knee today.  Continue with nonweightbearing quad strengthening exercises.  Follow-up with me as needed.  Could consider further imaging if this shot does not help.  Follow-Up Instructions: Return if symptoms worsen or fail to improve.   Orders:  Orders Placed This Encounter  Procedures  . XR KNEE 3 VIEW LEFT   No orders of the defined types were placed in this encounter.     Procedures: Large Joint Inj: L knee on 09/25/2017 11:17 AM Indications: diagnostic evaluation, joint swelling and pain Details: 18 G 1.5 in needle, superolateral approach  Arthrogram: No  Medications: 5 mL lidocaine 1 %; 40 mg methylPREDNISolone acetate 40 MG/ML; 4 mL bupivacaine 0.25 % Outcome: tolerated well, no immediate complications Procedure, treatment alternatives, risks and benefits explained, specific risks  discussed. Consent was given by the patient. Immediately prior to procedure a time out was called to verify the correct patient, procedure, equipment, support staff and site/side marked as required. Patient was prepped and draped in the usual sterile fashion.       Clinical Data: No additional findings.  Objective: Vital Signs: There were no vitals taken for this visit.  Physical Exam:   Constitutional: Patient appears well-developed HEENT:  Head: Normocephalic Eyes:EOM are normal Neck: Normal range of motion Cardiovascular: Normal rate Pulmonary/chest: Effort normal Neurologic: Patient is alert Skin: Skin is warm Psychiatric: Patient has normal mood and affect    Ortho Exam: Orthopedic exam demonstrates pretty normal gait and alignment with medial and lateral joint line tenderness in that left knee.  Extensor mechanism is intact and nontender.  Range of motion full but she does lack about the final 3-4 degrees of full extension on that left knee.  Pedal pulses palpable.  4 x 4 centimeter bruise noted just below the posterior medial knee flexion crease.  This is nontender to palpation.  Specialty Comments:  No specialty comments available.  Imaging: Xr Knee 3 View Left  Result Date: 09/25/2017 AP lateral merchant left knee reviewed.  No joint space narrowing or spurring is present.  Joint spaces are maintained.  No fracture or dislocation present.  No effusion.  Normal left knee    PMFS History: Patient Active Problem List   Diagnosis Date Noted  . Chronic anticoagulation 07/15/2017  . History of prediabetes   . 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 Smoker 01/14/2017  . Personal history of noncompliance with medical treatment, presenting hazards to health 01/14/2017  . Acute loss of vision, left 10/14/2016  . History of hypothyroidism 01/04/2016  . DVT of lower extremity (deep venous thrombosis) ([place]) 01/04/2016   Past Medical History:  Diagnosis Date  . Cataracts,  bilateral   . Chronic anticoagulation 07/15/2017  . Diverticulitis   . DVT (deep venous thrombosis) ([place])   . DVT of lower extremity (deep venous thrombosis) ([place]) 01/04/2016  . 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 Hiatal hernia with gastroesophageal reflux    disease  . History of prediabetes   . Insomnia   . Irritable bowel syndrome   . Panic attacks    Hx of    Family History  Problem Relation Age of Onset  . Leukemia Father   . Leukemia Brother   . Multiple sclerosis Neg Hx   . Autoimmune disease Neg Hx     Past Surgical History:  Procedure Laterality Date  . CHOLECYSTECTOMY    . TOE SURGERY    . TUBAL LIGATION    . US ECHOCARDIOGRAPHY  01-31-2009   EF 60%   Social History   Occupational History  . Occupation: Statistician  Tobacco Use  . Smoking status: Current Some Day Smoker    Packs/day: 0.50    Types: Cigarettes  . Smokeless tobacco: Never Used  Substance and Sexual Activity  . Alcohol use: No    Alcohol/week: 0.0 oz  . Drug use: No  . Sexual activity: Not on file

## 2017-09-30 ENCOUNTER — Telehealth (INDEPENDENT_AMBULATORY_CARE_PROVIDER_SITE_OTHER): Payer: Self-pay | Admitting: Orthopedic Surgery

## 2017-09-30 DIAGNOSIS — G8929 Other chronic pain: Secondary | ICD-10-CM

## 2017-09-30 DIAGNOSIS — M25562 Pain in left knee: Principal | ICD-10-CM

## 2017-09-30 NOTE — Addendum Note (Signed)
Addended byLaurann Montana on: 09/30/2017 04:37 PM   Modules accepted: Orders

## 2017-09-30 NOTE — Telephone Encounter (Signed)
IC s/w patient and advised. Unable to sw Juliann Pulse b/c she is not on patients medical release form.  I discussed with patient. Note written and patient will call back with fax number. Also ordered MRI scan.

## 2017-09-30 NOTE — Telephone Encounter (Signed)
Are you continuing to keep patient out of work?

## 2017-09-30 NOTE — Telephone Encounter (Signed)
Juliann Pulse called requesting a new work note for patient, since the last one only took her out through December.  If you could give her a call back at [phone]

## 2017-09-30 NOTE — Telephone Encounter (Signed)
Need to get MRI scan if we are going to keep her out of work.  Okay to keep her out of work the rest of this week after the injection.  I think with continued symptoms and normal radiographs we should plan for MRI scan next week.  Please call thanks

## 2017-10-08 ENCOUNTER — Telehealth (INDEPENDENT_AMBULATORY_CARE_PROVIDER_SITE_OTHER): Payer: Self-pay | Admitting: Physical Medicine and Rehabilitation

## 2017-10-08 NOTE — Telephone Encounter (Signed)
Have have done facet and MBB so if they helped need RFA

## 2017-10-09 NOTE — Telephone Encounter (Signed)
Needs auth for RFA. Patient reports complete relief from first which lasted about a week; more than 50% relief from second which lasted a week to a week and a half.

## 2017-10-12 ENCOUNTER — Ambulatory Visit
Admission: RE | Admit: 2017-10-12 | Discharge: 2017-10-12 | Disposition: A | Discharge status: Home / Self Care | Payer: BLUE CROSS/BLUE SHIELD | Source: Ambulatory Visit | Attending: Orthopedic Surgery | Admitting: Orthopedic Surgery

## 2017-10-12 DIAGNOSIS — M25562 Pain in left knee: Principal | ICD-10-CM

## 2017-10-12 DIAGNOSIS — G8929 Other chronic pain: Secondary | ICD-10-CM

## 2017-10-14 NOTE — Telephone Encounter (Signed)
Called insurance and talked to [employer], pre cert is pending. Faxed notes from chart to [phone]. Reference # [id number]

## 2017-10-16 ENCOUNTER — Telehealth (INDEPENDENT_AMBULATORY_CARE_PROVIDER_SITE_OTHER): Payer: Self-pay | Admitting: *Deleted

## 2017-10-16 NOTE — Telephone Encounter (Signed)
Okay for out of work for 3 weeks only then she will have to take it up with Dr. Ernestina Patches thanks

## 2017-10-16 NOTE — Telephone Encounter (Signed)
IC advised could pick up at front desk.  

## 2017-10-16 NOTE — Telephone Encounter (Signed)
  Dr Ernestina Patches trying to get patients insurance to approve RFA. She was supposed to return pain diaries to them so they could submit to her insurance company, but she did not complete them. [place] team still trying to get approved. In the meantime, patient is requesting extension of work note. .She states she cannot return to work in this condition.  Your previous note stated she could remain out of work until 1 week after injection with Ernestina Patches and that she also needed to have MRI scan. Her scan of the knee has been done. Please advise. Thanks.

## 2017-10-30 ENCOUNTER — Telehealth (INDEPENDENT_AMBULATORY_CARE_PROVIDER_SITE_OTHER): Payer: Self-pay | Admitting: *Deleted

## 2017-11-25 ENCOUNTER — Other Ambulatory Visit: Payer: Self-pay | Admitting: Family Medicine

## 2017-11-25 NOTE — Telephone Encounter (Signed)
Ok to refill 

## 2017-11-25 NOTE — Telephone Encounter (Signed)
Is this okay to refill? 

## 2017-12-01 IMAGING — CR DG CHEST 2V
2 series · 2 of 2 positions shown · non-contrast
Comparison: 08/05/2015.

CLINICAL DATA: Positive TB skin test.  No current chest symptoms.

EXAM:
CHEST  2 VIEW

[w chest pa]
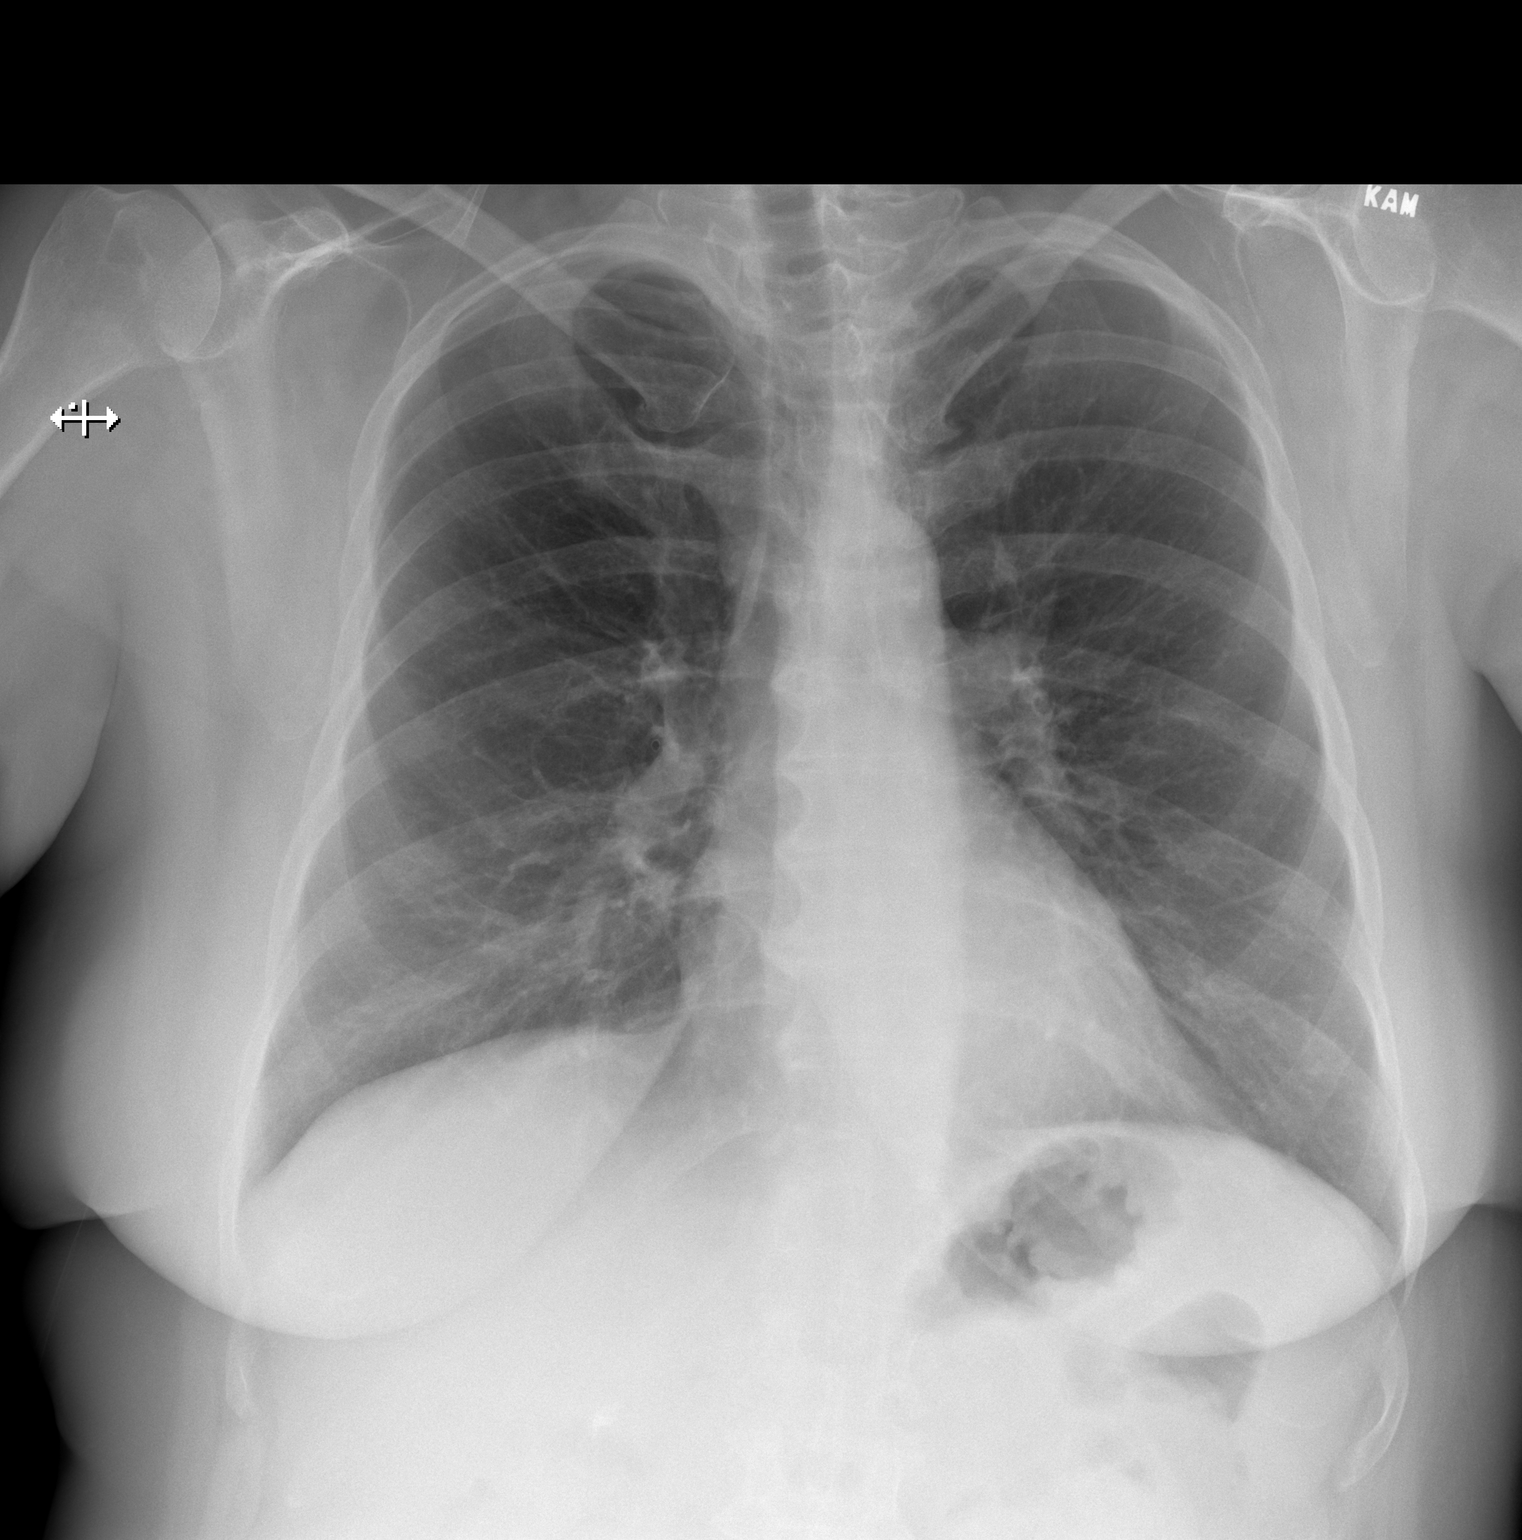

[w chest lat]
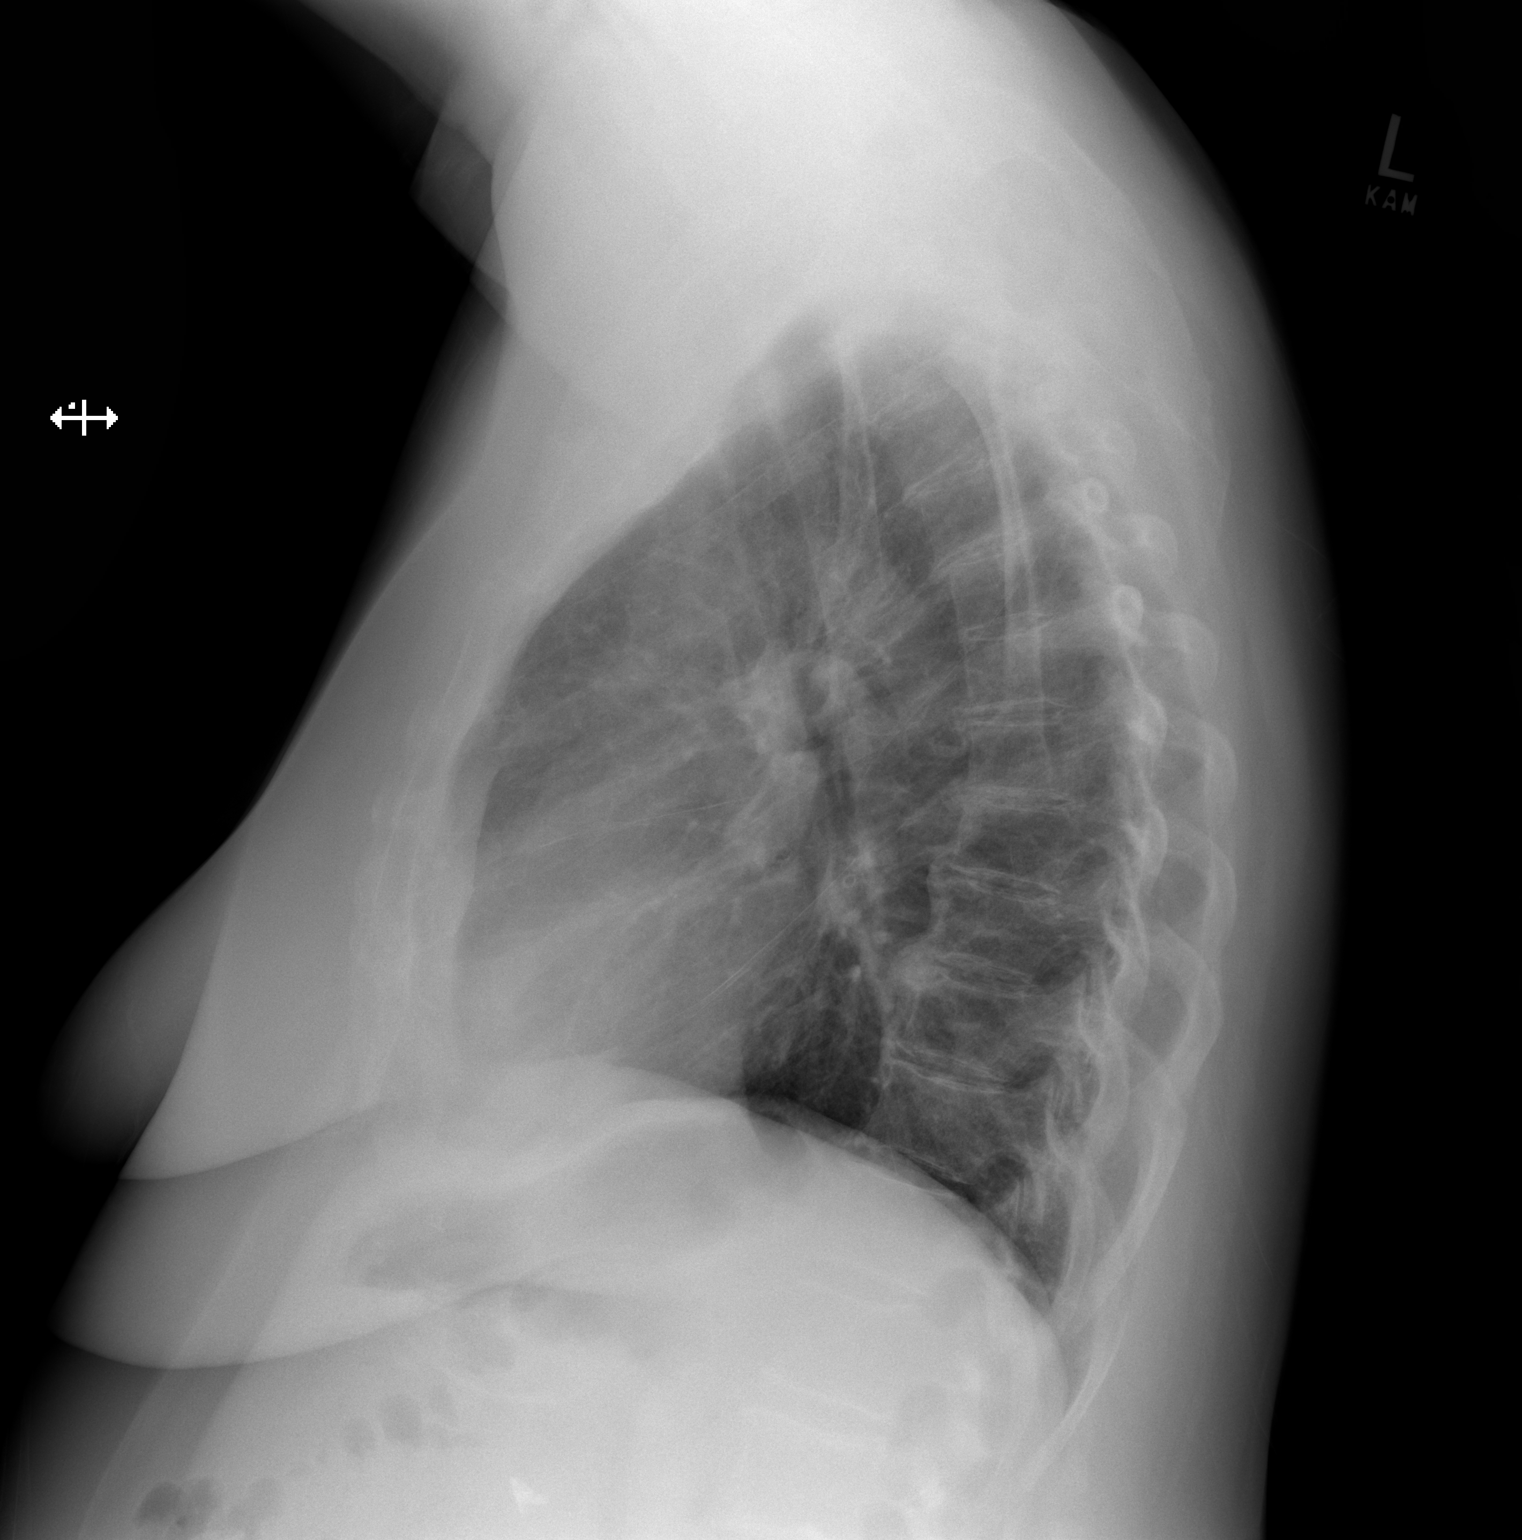

[2 of 2 positions shown; findings below may reference images not displayed]

FINDINGS: The heart size and mediastinal contours are within normal limits.
Both lungs are clear. The visualized skeletal structures are
unremarkable.
IMPRESSION: No active cardiopulmonary disease.

## 2017-12-16 ENCOUNTER — Telehealth (INDEPENDENT_AMBULATORY_CARE_PROVIDER_SITE_OTHER): Payer: Self-pay | Admitting: *Deleted

## 2017-12-16 NOTE — Telephone Encounter (Signed)
Please advise. Thanks.  

## 2017-12-17 NOTE — Telephone Encounter (Signed)
Rf to yates or nitka thx

## 2017-12-17 NOTE — Telephone Encounter (Signed)
appt scheduled for her to see Dr Lorin Mercy.

## 2018-01-05 ENCOUNTER — Telehealth: Payer: Self-pay

## 2018-01-05 MED ORDER — RIVAROXABAN 20 MG PO TABS: ORAL_TABLET | ORAL | 0 refills | Status: DC

## 2018-01-05 NOTE — Telephone Encounter (Signed)
Pt called and states she can not afford xarelto any longer since she does not have a job and no insurance and wanted to go to every other day taking the med. Per vickie- she has to stay on this every day. We have 14 tablets worth of samples that she will come and pick up   Mickel Baas-,  is there any kind of patient assistance we can do for her to be able to get her to stay on the xarelto?

## 2018-01-05 NOTE — Telephone Encounter (Signed)
Patient has called stating she needs a call back from [place]. Pt did not specify what the call back was in regards to.

## 2018-01-05 NOTE — Telephone Encounter (Signed)
Left message for pt to call me back 

## 2018-01-07 NOTE — Telephone Encounter (Signed)
Patient has picked up her samples 01/07/18 at 4:18pm

## 2018-01-08 ENCOUNTER — Telehealth: Payer: Self-pay | Admitting: Family Medicine

## 2018-01-08 NOTE — Telephone Encounter (Signed)
Called rep for samples, printed Pt Assistance application for pt to complete

## 2018-01-08 NOTE — Telephone Encounter (Signed)
Recv'd samples Xarelto 20mg  for pt and printed PT Assistance application.  Called pt and informed

## 2018-01-13 ENCOUNTER — Ambulatory Visit (INDEPENDENT_AMBULATORY_CARE_PROVIDER_SITE_OTHER): Payer: BLUE CROSS/BLUE SHIELD | Admitting: Orthopaedic Surgery

## 2018-03-20 MED ORDER — RIVAROXABAN 20 MG PO TABS: ORAL_TABLET | ORAL | 0 refills | Status: DC

## 2018-03-20 NOTE — Telephone Encounter (Signed)
Called pt and she has not heard anything from Pt Assistance, advised we have more samples for her, ok per [name].        I called Rhonda Hudson and Rhonda Hudson t# [phone] pt had missed one box on application that wasn't checked,  They corrected over the phone and will reprocess

## 2018-04-03 NOTE — Telephone Encounter (Signed)
error 

## 2018-04-30 ENCOUNTER — Ambulatory Visit: Payer: Self-pay | Admitting: Family Medicine

## 2018-05-05 NOTE — Telephone Encounter (Signed)
Called Pt Assistance t# [phone] and was told that it was not handled in July when I called previously and she apologized.  Rhonda Hudson will take care noting the issue with the taxes but now has been over 60 days and the application has expired so page 4 has to be filled out again and dated and re-faxed.  This was completed and faxed to [facility] and she will expedite the process.

## 2018-05-06 ENCOUNTER — Encounter: Payer: Self-pay | Admitting: Family Medicine

## 2018-05-06 ENCOUNTER — Ambulatory Visit (INDEPENDENT_AMBULATORY_CARE_PROVIDER_SITE_OTHER): Payer: Self-pay | Admitting: Family Medicine

## 2018-05-06 VITALS — BP 140/90 | HR 88 | Temp 97.7°F | Wt 225.0 lb

## 2018-05-06 DIAGNOSIS — R319 Hematuria, unspecified: Secondary | ICD-10-CM

## 2018-05-06 DIAGNOSIS — F17210 Nicotine dependence, cigarettes, uncomplicated: Secondary | ICD-10-CM

## 2018-05-06 DIAGNOSIS — G8929 Other chronic pain: Secondary | ICD-10-CM

## 2018-05-06 DIAGNOSIS — R42 Dizziness and giddiness: Secondary | ICD-10-CM

## 2018-05-06 DIAGNOSIS — M545 Low back pain, unspecified: Secondary | ICD-10-CM

## 2018-05-06 DIAGNOSIS — R7303 Prediabetes: Secondary | ICD-10-CM

## 2018-05-06 DIAGNOSIS — R635 Abnormal weight gain: Secondary | ICD-10-CM

## 2018-05-06 DIAGNOSIS — F17200 Nicotine dependence, unspecified, uncomplicated: Secondary | ICD-10-CM

## 2018-05-06 LAB — POCT URINALYSIS DIP (PROADVANTAGE DEVICE)
BILIRUBIN UA: NEGATIVE
BILIRUBIN UA: NEGATIVE mg/dL
Glucose, UA: NEGATIVE mg/dL
LEUKOCYTES UA: NEGATIVE
NITRITE UA: NEGATIVE
PROTEIN UA: NEGATIVE mg/dL
Specific Gravity, Urine: 1.015
Urobilinogen, Ur: NEGATIVE
pH, UA: 6 (ref 5.0–8.0)

## 2018-05-06 NOTE — Progress Notes (Signed)
Subjective:    Patient ID: Rhonda Hudson, female    DOB: 12/23/1955, 62 y.o.   MRN: [id number]  HPI Chief Complaint  Patient presents with  . kidney issue    kidney issue- back in 2010 had a cyst on kidney so she recently saw blood in toliet. Thyroid- wants it checked.   She is here today with multiple concerns.  Presents with intermittent dizziness for the past month or so. States she has a "spinning sensation" that occurs at random times and lasts only a second or two.  States her vision in her left eye is "going bad again". She was supposed to follow up with Dr. Katy Fitch for this but did not.   Also reports headaches that are usually frontal and bilateral. These are intermittent and not associated with dizziness.   Denies numbness, tingling or weakness. No falls.   She is on Xarelto and taking this daily for history of DVTs. States she saw blood on the toilet tissue after urinating for a couple of day 2 weeks ago but this stopped. No bleeding or bruising.   She is under the care of her orthopedist for low back pain that is chronic. She has not followed up recently. States surgery was denied. She is taking Tylenol but this does not help.  Dr. Ernestina Patches.   She is not sexually active.   Denies fever, chills, double vision, tinnitus, ear fullness, fatigue, chest pain, palpitations, shortness of breath, abdominal pain, N/V/D, constipation, urinary symptoms, vaginal discharge, LE edema.   She wants to stop smoking. States she no longer buys cigarettes for herself.  Friends are bringing them by and her sons who live with her smoke.   Complains of recent weight gain that she thinks is not justified based on her diet. Would like to have her thyroid function checked. No hair, skin or nail changes. Bowel movements are regular.    Reviewed allergies, medications, past medical, surgical, family, and social history.    Review of Systems Pertinent positives and negatives in the history of  present illness.     Objective:   Physical Exam BP 140/90   Pulse 88   Temp 97.7 F (36.5 C) (Oral)   Wt 225 lb (102.1 kg) Comment: per pt  BMI 33.23 kg/m   Alert and in no distress. Tympanic membranes and canals are normal. Pharyngeal area is normal. Neck is supple without adenopathy or thyromegaly. Cardiac exam shows a regular sinus rhythm without murmurs or gallops. Lungs are clear to auscultation. Abdominal exam declined as well as GU exam.extremities without edema, pulses intact.  Calves are soft, nontender and symmetric.  PERRLA, EOMs intact, CNs intact, DTRs normal and symmetric. Skin is warm and dry, no pallor. Gait normal. Negative Romberg.       Assessment & Plan:  Prediabetes - Plan: CBC with Differential/Platelet, Comprehensive metabolic panel, TSH, T4, free, Hemoglobin A1c  Hematuria, unspecified type - Plan: POCT Urinalysis DIP (Proadvantage Device), Urine Microscopic  Chronic left-sided low back pain without sciatica  Cigarette smoker motivated to quit  Episode of dizziness - Plan: CBC with Differential/Platelet, Comprehensive metabolic panel  Weight gain, abnormal - Plan: TSH, T4, free, Hemoglobin A1c  Counseling done on healthy diet and lifestyle behaviors.  Smoking cessation counseling done and a handout was given to her on how to stop smoking.  She appears motivated but does not currently have a plan. Encouraged her to focus on things that she can do instead of what she can no  longer do because of her declining health. Encouraged her to see Dr. Carolynn Sayers for follow-up on left eye.  Encouraged her to follow-up with Dr. Ernestina Patches for chronic low back pain.  This is unchanged. She appears to be neurovascularly intact, no focal deficits.  Dizziness not present today. She is hemodynamically stable.  No acute distress. Will check labs and follow-up.  Add a microscopic urinalysis based on questionable hematuria.  Discussed that we may need to refer her to urology if she does  have blood in her urine.  Samples of Xarelto were given to her today.  She will need to be on this indefinitely per hematology.

## 2018-05-06 NOTE — Patient Instructions (Addendum)
Call and schedule with Dr. Katy Fitch.   Stop smoking!!! Find a healthy hobby to replace this. Tell your friends no thanks for cigarettes.      Coping with Quitting Smoking Quitting smoking is a physical and mental challenge. You will face cravings, withdrawal symptoms, and temptation. Before quitting, work with your health care provider to make a plan that can help you cope. Preparation can help you quit and keep you from giving in. How can I cope with cravings? Cravings usually last for 5-10 minutes. If you get through it, the craving will pass. Consider taking the following actions to help you cope with cravings:  Keep your mouth busy: ? Chew sugar-free gum. ? Suck on hard candies or a straw. ? Brush your teeth.  Keep your hands and body busy: ? Immediately change to a different activity when you feel a craving. ? Squeeze or play with a ball. ? Do an activity or a hobby, like making bead jewelry, practicing needlepoint, or working with wood. ? Mix up your normal routine. ? Take a short exercise break. Go for a quick walk or run up and down stairs. ? Spend time in public places where smoking is not allowed.  Focus on doing something kind or helpful for someone else.  Call a friend or family member to talk during a craving.  Join a support group.  Call a quit line, such as 1-800-QUIT-NOW.  Talk with your health care provider about medicines that might help you cope with cravings and make quitting easier for you.  How can I deal with withdrawal symptoms? Your body may experience negative effects as it tries to get used to not having nicotine in the system. These effects are called withdrawal symptoms. They may include:  Feeling hungrier than normal.  Trouble concentrating.  Irritability.  Trouble sleeping.  Feeling depressed.  Restlessness and agitation.  Craving a cigarette.  To manage withdrawal symptoms:  Avoid places, people, and activities that trigger your  cravings.  Remember why you want to quit.  Get plenty of sleep.  Avoid coffee and other caffeinated drinks. These may worsen some of your symptoms.  How can I handle social situations? Social situations can be difficult when you are quitting smoking, especially in the first few weeks. To manage this, you can:  Avoid parties, bars, and other social situations where people might be smoking.  Avoid alcohol.  Leave right away if you have the urge to smoke.  Explain to your family and friends that you are quitting smoking. Ask for understanding and support.  Plan activities with friends or family where smoking is not an option.  What are some ways I can cope with stress? Wanting to smoke may cause stress, and stress can make you want to smoke. Find ways to manage your stress. Relaxation techniques can help. For example:  Breathe slowly and deeply, in through your nose and out through your mouth.  Listen to soothing, relaxing music.  Talk with a family member or friend about your stress.  Light a candle.  Soak in a bath or take a shower.  Think about a peaceful place.  What are some ways I can prevent weight gain? Be aware that many people gain weight after they quit smoking. However, not everyone does. To keep from gaining weight, have a plan in place before you quit and stick to the plan after you quit. Your plan should include:  Having healthy snacks. When you have a craving, it may help  to: ? Eat plain popcorn, crunchy carrots, celery, or other cut vegetables. ? Chew sugar-free gum.  Changing how you eat: ? Eat small portion sizes at meals. ? Eat 4-6 small meals throughout the day instead of 1-2 large meals a day. ? Be mindful when you eat. Do not watch television or do other things that might distract you as you eat.  Exercising regularly: ? Make time to exercise each day. If you do not have time for a long workout, do short bouts of exercise for 5-10 minutes several  times a day. ? Do some form of strengthening exercise, like weight lifting, and some form of aerobic exercise, like running or swimming.  Drinking plenty of water or other low-calorie or no-calorie drinks. Drink 6-8 glasses of water daily, or as much as instructed by your health care provider.  Summary  Quitting smoking is a physical and mental challenge. You will face cravings, withdrawal symptoms, and temptation to smoke again. Preparation can help you as you go through these challenges.  You can cope with cravings by keeping your mouth busy (such as by chewing gum), keeping your body and hands busy, and making calls to family, friends, or a helpline for people who want to quit smoking.  You can cope with withdrawal symptoms by avoiding places where people smoke, avoiding drinks with caffeine, and getting plenty of rest.  Ask your health care provider about the different ways to prevent weight gain, avoid stress, and handle social situations. This information is not intended to replace advice given to you by your health care provider. Make sure you discuss any questions you have with your health care provider. Document Released: 08/30/2016 Document Revised: 08/30/2016 Document Reviewed: 08/30/2016 Elsevier Interactive Patient Education  [employer].

## 2018-05-06 NOTE — Telephone Encounter (Signed)
#  2 samples given per [name] to hold pt while waiting on Pt Assistance

## 2018-05-07 LAB — URINALYSIS, MICROSCOPIC ONLY: Casts: NONE SEEN /lpf

## 2018-05-07 LAB — COMPREHENSIVE METABOLIC PANEL
ALBUMIN: 3.9 g/dL (ref 3.6–4.8)
ALT: 17 IU/L (ref 0–32)
AST: 14 IU/L (ref 0–40)
Albumin/Globulin Ratio: 1.5 (ref 1.2–2.2)
Alkaline Phosphatase: 83 IU/L (ref 39–117)
BILIRUBIN TOTAL: 0.3 mg/dL (ref 0.0–1.2)
BUN / CREAT RATIO: 7 — AB (ref 12–28)
BUN: 6 mg/dL — AB (ref 8–27)
CALCIUM: 9.5 mg/dL (ref 8.7–10.3)
CHLORIDE: 101 mmol/L (ref 96–106)
CO2: 22 mmol/L (ref 20–29)
CREATININE: 0.92 mg/dL (ref 0.57–1.00)
GFR calc Af Amer: 76 mL/min/{1.73_m2} (ref >59)
GFR, EST NON AFRICAN AMERICAN: 66 mL/min/{1.73_m2} (ref >59)
GLUCOSE: 110 mg/dL — AB (ref 65–99)
Globulin, Total: 2.6 g/dL (ref 1.5–4.5)
Potassium: 4.5 mmol/L (ref 3.5–5.2)
Sodium: 136 mmol/L (ref 134–144)
Total Protein: 6.5 g/dL (ref 6.0–8.5)

## 2018-05-07 LAB — CBC WITH DIFFERENTIAL/PLATELET
BASOS ABS: 0 10*3/uL (ref 0.0–0.2)
BASOS: 0 %
EOS (ABSOLUTE): 0.1 10*3/uL (ref 0.0–0.4)
Eos: 1 %
HEMOGLOBIN: 14.7 g/dL (ref 11.1–15.9)
Hematocrit: 43.6 % (ref 34.0–46.6)
IMMATURE GRANS (ABS): 0 10*3/uL (ref 0.0–0.1)
IMMATURE GRANULOCYTES: 0 %
LYMPHS: 23 %
Lymphocytes Absolute: 2.5 10*3/uL (ref 0.7–3.1)
MCH: 28.9 pg (ref 26.6–33.0)
MCHC: 33.7 g/dL (ref 31.5–35.7)
MCV: 86 fL (ref 79–97)
MONOCYTES: 6 %
Monocytes Absolute: 0.6 10*3/uL (ref 0.1–0.9)
NEUTROS ABS: 7.4 10*3/uL — AB (ref 1.4–7.0)
NEUTROS PCT: 70 %
PLATELETS: 239 10*3/uL (ref 150–450)
RBC: 5.08 x10E6/uL (ref 3.77–5.28)
RDW: 14.7 % (ref 12.3–15.4)
WBC: 10.7 10*3/uL (ref 3.4–10.8)

## 2018-05-07 LAB — HEMOGLOBIN A1C
Est. average glucose Bld gHb Est-mCnc: 146 mg/dL
HEMOGLOBIN A1C: 6.7 % — AB (ref 4.8–5.6)

## 2018-05-07 LAB — TSH: TSH: 0.709 u[IU]/mL (ref 0.450–4.500)

## 2018-05-07 LAB — T4, FREE: Free T4: 1.17 ng/dL (ref 0.82–1.77)

## 2018-05-13 ENCOUNTER — Telehealth: Payer: Self-pay | Admitting: Family Medicine

## 2018-05-13 NOTE — Telephone Encounter (Signed)
Please call and advise her to go to the [facility] emergency department due to postmenopausal vaginal bleeding.  This is abnormal and needs to be checked right away.  This may be related to the Xarelto but unlikely so she should not stop the medication. most likely this is due to a pelvic problem and she will need an ultrasound to look at the source of the bleeding.

## 2018-05-13 NOTE — Telephone Encounter (Signed)
Pt says bleeding from vaginal area when she goes to the bathroom has started again. The bleed is heavy as if she is having a period with blood clots. She is not able to come in today due to a bad headache and not able to drive.

## 2018-05-13 NOTE — Telephone Encounter (Signed)
Pt [facility] be [facility] out right away. She has a headache and she is not sure she will go today and I advised her this is very important that you go. I do not know what he decide is. But she was advised multiple times she needed to go today

## 2018-05-14 ENCOUNTER — Inpatient Hospital Stay (HOSPITAL_COMMUNITY)
Admission: AD | Admit: 2018-05-14 | Discharge: 2018-05-14 | Disposition: A | Discharge status: Home / Self Care | Payer: BLUE CROSS/BLUE SHIELD | Source: Ambulatory Visit | Attending: Obstetrics and Gynecology | Admitting: Obstetrics and Gynecology

## 2018-05-14 ENCOUNTER — Encounter (HOSPITAL_COMMUNITY): Payer: Self-pay | Admitting: *Deleted

## 2018-05-14 DIAGNOSIS — N939 Abnormal uterine and vaginal bleeding, unspecified: Secondary | ICD-10-CM | POA: Insufficient documentation

## 2018-05-14 DIAGNOSIS — F17210 Nicotine dependence, cigarettes, uncomplicated: Secondary | ICD-10-CM | POA: Insufficient documentation

## 2018-05-14 DIAGNOSIS — Z9049 Acquired absence of other specified parts of digestive tract: Secondary | ICD-10-CM | POA: Insufficient documentation

## 2018-05-14 DIAGNOSIS — E119 Type 2 diabetes mellitus without complications: Secondary | ICD-10-CM | POA: Insufficient documentation

## 2018-05-14 DIAGNOSIS — Z9851 Tubal ligation status: Secondary | ICD-10-CM | POA: Insufficient documentation

## 2018-05-14 DIAGNOSIS — Z86718 Personal history of other venous thrombosis and embolism: Secondary | ICD-10-CM | POA: Insufficient documentation

## 2018-05-14 HISTORY — DX: Type 2 diabetes mellitus without complications: E11.9

## 2018-05-14 LAB — CBC
HCT: 44.1 % (ref 36.0–46.0)
Hemoglobin: 15.2 g/dL — ABNORMAL HIGH (ref 12.0–15.0)
MCH: 30.1 pg (ref 26.0–34.0)
MCHC: 34.5 g/dL (ref 30.0–36.0)
MCV: 87.3 fL (ref 78.0–100.0)
PLATELETS: 225 10*3/uL (ref 150–400)
RBC: 5.05 MIL/uL (ref 3.87–5.11)
RDW: 14 % (ref 11.5–15.5)
WBC: 10.1 10*3/uL (ref 4.0–10.5)

## 2018-05-14 LAB — URINALYSIS, ROUTINE W REFLEX MICROSCOPIC
Bilirubin Urine: NEGATIVE
GLUCOSE, UA: NEGATIVE mg/dL
Ketones, ur: NEGATIVE mg/dL
Leukocytes, UA: NEGATIVE
Nitrite: NEGATIVE
PROTEIN: NEGATIVE mg/dL
Specific Gravity, Urine: 1.008 (ref 1.005–1.030)
pH: 6 (ref 5.0–8.0)

## 2018-05-14 LAB — PROTIME-INR
INR: 1.36
PROTHROMBIN TIME: 16.6 s — AB (ref 11.4–15.2)

## 2018-05-14 LAB — APTT: APTT: 49 s — AB (ref 24–36)

## 2018-05-14 NOTE — Discharge Instructions (Signed)

## 2018-05-14 NOTE — MAU Note (Signed)
Pt reports she has had spotting off and on for a month, yesterday had heavy vaginal bleeding all day, stopped during the night.

## 2018-05-14 NOTE — MAU Provider Note (Signed)
History     CSN: [id number]  Arrival date and time: 05/14/18 1941   First Provider Initiated Contact with Patient 05/14/18 [phone]      Chief Complaint  Patient presents with  . Vaginal Bleeding   HPI   Rhonda Hudson is a 62 y.o. female G3P0 non pregnant here in MAU with vaginal bleeding. States 1 month ago she noticed spotting when she wipes. The spotting was light and it stopped. Yesterday she started having heavy vaginal  bleeding. "Bleeding with large clots". She is post menopausal X 9 years, last period was at age 48. She has not had GYN care in "I can't remember when the last time was". No recent pap smears. Today the bleeding has stopped. More frequent HA's recently with dizzy spells. The dizzy spells seem to be subsiding. 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  Last dose of Xarelto was last night at dinner; she took 20 mg.  Last intercourse was 20 years ago.   OB History    Gravida  3   Para      Term      Preterm      AB      Living  3     SAB      TAB      Ectopic      Multiple      Live Births  3           Past Medical History:  Diagnosis Date  . Cataracts, bilateral   . Chronic anticoagulation 07/15/2017  . Diabetes mellitus without complication ([place])   . Diverticulitis   . DVT (deep venous thrombosis) ([place])   . DVT of lower extremity (deep venous thrombosis) ([place]) 01/04/2016  . 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 Hiatal hernia with gastroesophageal reflux    disease  . History of prediabetes   . Insomnia   . Irritable bowel syndrome   . Panic attacks    Hx of    Past Surgical History:  Procedure Laterality Date  . CHOLECYSTECTOMY    . TOE SURGERY    . TUBAL LIGATION    . US ECHOCARDIOGRAPHY  01-31-2009   EF 60%    Family History  Problem Relation Age of Onset  . Leukemia Father   . Leukemia Sister   . Leukemia Brother   . Multiple sclerosis Neg Hx   . Autoimmune disease Neg Hx     Social  History   Tobacco Use  . Smoking status: Current Some Day Smoker    Packs/day: 0.50    Types: Cigarettes  . Smokeless tobacco: Never Used  Substance Use Topics  . Alcohol use: No    Alcohol/week: 0.0 standard drinks  . Drug use: No    Allergies: No Known Allergies  Medications Prior to Admission  Medication Sig Dispense Refill Last Dose  . rivaroxaban (XARELTO) 20 MG TABS tablet TAKE 1 TABLET BY MOUTH  DAILY WITH SUPPER 28 tablet 0 Taking   Results for orders placed or performed during the hospital encounter of 05/14/18 (from the past 48 hour(s))  Urinalysis, Routine w reflex microscopic     Status: Abnormal   Collection Time: 05/14/18  7:43 AM  Result Value Ref Range   Color, Urine STRAW (A) YELLOW   APPearance HAZY (A) CLEAR   Specific Gravity, Urine 1.008 1.005 - 1.030   pH 6.0 5.0 - 8.0   Glucose, UA NEGATIVE NEGATIVE mg/dL  Hgb urine dipstick MODERATE (A) NEGATIVE   Bilirubin Urine NEGATIVE NEGATIVE   Ketones, ur NEGATIVE NEGATIVE mg/dL   Protein, ur NEGATIVE NEGATIVE mg/dL   Nitrite NEGATIVE NEGATIVE   Leukocytes, UA NEGATIVE NEGATIVE   RBC / HPF 0-5 0 - 5 RBC/hpf   WBC, UA 0-5 0 - 5 WBC/hpf   Bacteria, UA FEW (A) NONE SEEN   Squamous Epithelial / LPF 0-5 0 - 5    Comment: Performed at [facility], [address] [place], [place] [address]  CBC     Status: Abnormal   Collection Time: 05/14/18  7:57 AM  Result Value Ref Range   WBC 10.1 4.0 - 10.5 K/uL   RBC 5.05 3.87 - 5.11 MIL/uL   Hemoglobin 15.2 (H) 12.0 - 15.0 g/dL   HCT 44.1 36.0 - 46.0 %   MCV 87.3 78.0 - 100.0 fL   MCH 30.1 26.0 - 34.0 pg   MCHC 34.5 30.0 - 36.0 g/dL   RDW 14.0 11.5 - 15.5 %   Platelets 225 150 - 400 K/uL    Comment: Performed at [facility], [address], [place], [place] [address]  Protime-INR     Status: Abnormal   Collection Time: 05/14/18  7:57 AM  Result Value Ref Range   Prothrombin Time 16.6 (H) 11.4 - 15.2 seconds   INR 1.36     Comment: Performed at [facility], [place] [address]  APTT     Status: Abnormal   Collection Time: 05/14/18  7:57 AM  Result Value Ref Range   aPTT 49 (H) 24 - 36 seconds    Comment:        IF BASELINE aPTT IS ELEVATED, SUGGEST PATIENT RISK ASSESSMENT BE USED TO DETERMINE APPROPRIATE ANTICOAGULANT THERAPY. Performed at [facility], [address] [place], [place] [address]    Review of Systems  Gastrointestinal: Negative for abdominal pain.  Genitourinary: Positive for dysuria, hematuria and vaginal bleeding.   Physical Exam   Blood pressure (!) 142/83, pulse 75, temperature 97.8 F (36.6 C), temperature source Oral, resp. rate 17, height 5' 7.5" (1.715 m), weight 102.5 kg, SpO2 95 %.  Physical Exam  Constitutional: She is oriented to person, place, and time. She appears well-developed and well-nourished. No distress.  HENT:  Head: Normocephalic.  Eyes: Pupils are equal, round, and reactive to light.  GI: Soft. She exhibits no distension. There is no tenderness. There is no rebound and no guarding.  Genitourinary:  Genitourinary Comments: Vagina - Small amount of brown vaginal discharge, no odor  Cervix - No contact bleeding, no active bleeding  Bimanual exam: Cervix closed Uterus non tender, normal size Adnexa non tender, no masses bilaterally Chaperone present for exam.   Neurological: She is alert and oriented to person, place, and time.  Skin: Skin is warm. She is not diaphoretic.  Psychiatric: Her behavior is normal.    MAU Course  Procedures  None  MDM  Hgb stable now   Assessment and Plan   A:  1. Abnormal uterine bleeding   2. History of DVT of lower extremity     P:  Discharge home in stable condition F/U with PCP Return to MAU if bleeding worsens [place] ordered and scheduled for 9/4 @ 1:00 pm Message sent to [facility]  Will follow patient to make sure she is seen in the [facility] for workup. Patient aware and agreeable.   Lezlie Lye,  NP 05/14/2018 4:46 PM

## 2018-05-20 ENCOUNTER — Telehealth: Payer: Self-pay

## 2018-05-20 ENCOUNTER — Ambulatory Visit (HOSPITAL_COMMUNITY)
Admission: RE | Admit: 2018-05-20 | Discharge: 2018-05-20 | Disposition: A | Discharge status: Home / Self Care | Payer: Self-pay | Source: Ambulatory Visit | Attending: Obstetrics and Gynecology | Admitting: Obstetrics and Gynecology

## 2018-05-20 DIAGNOSIS — N939 Abnormal uterine and vaginal bleeding, unspecified: Secondary | ICD-10-CM | POA: Insufficient documentation

## 2018-05-20 DIAGNOSIS — R9389 Abnormal findings on diagnostic imaging of other specified body structures: Secondary | ICD-10-CM | POA: Insufficient documentation

## 2018-05-20 DIAGNOSIS — N950 Postmenopausal bleeding: Secondary | ICD-10-CM | POA: Insufficient documentation

## 2018-05-20 NOTE — Telephone Encounter (Signed)
Pt called and stated she needs more samples of Xarelto. She will be out of pills on Friday. She has an appointment today at [facility] and may need to have someone else to pick it up. Is this ok?

## 2018-05-20 NOTE — Telephone Encounter (Signed)
This is ok as long as we have samples for her. Thanks.

## 2018-05-20 NOTE — Telephone Encounter (Signed)
Called [facility] spoke with Karmen Bongo and she was able to resend to processing and get approval from 05/20/18 til 05/21/19.  This program is a pharmacy card program,  We call prescription in and into local pharmacy & card pays for #30 each month for a year at no cost to patient.  Pt will receive card thru mail but I went ahead & got the info over the phone due to urgency of situation.  Card info is Toma Aran member ID# [id number] RX YJG#[id number] RX BIN# [id number]    I called Rx into [facility] with 5 refills.  ([name] had already approved 11 refills on Pt Assistance form) went thro for [employer] pt & informed

## 2018-05-25 ENCOUNTER — Other Ambulatory Visit (HOSPITAL_COMMUNITY)
Admission: RE | Admit: 2018-05-25 | Discharge: 2018-05-25 | Disposition: A | Discharge status: Home / Self Care | Payer: Self-pay | Source: Ambulatory Visit | Attending: Obstetrics & Gynecology | Admitting: Obstetrics & Gynecology

## 2018-05-25 ENCOUNTER — Ambulatory Visit (INDEPENDENT_AMBULATORY_CARE_PROVIDER_SITE_OTHER): Payer: Self-pay | Admitting: Obstetrics & Gynecology

## 2018-05-25 ENCOUNTER — Encounter: Payer: Self-pay | Admitting: Obstetrics & Gynecology

## 2018-05-25 VITALS — BP 138/78 | HR 75 | Wt 223.5 lb

## 2018-05-25 DIAGNOSIS — N950 Postmenopausal bleeding: Secondary | ICD-10-CM

## 2018-05-25 DIAGNOSIS — R9389 Abnormal findings on diagnostic imaging of other specified body structures: Secondary | ICD-10-CM | POA: Insufficient documentation

## 2018-05-25 DIAGNOSIS — Z1151 Encounter for screening for human papillomavirus (HPV): Secondary | ICD-10-CM

## 2018-05-25 DIAGNOSIS — Z124 Encounter for screening for malignant neoplasm of cervix: Secondary | ICD-10-CM

## 2018-05-25 NOTE — Addendum Note (Signed)
Addended by: Emily Filbert on: 05/25/2018 09:27 AM   Modules accepted: Level of Service

## 2018-05-25 NOTE — Progress Notes (Signed)
   Subjective:    Patient ID: Rhonda Hudson, female    DOB: 06/06/1956, 62 y.o.   MRN: [id number]  HPI 62 yo divorced P3 (64, 46, and 20 yo kids, 0 grands) here for PMB and EMBX. She was seen in the MAU about 2 weeks ago with a 1 month h/o PMB. She is on xeralto for DVT. She had a gyn u/s that showed a 21 mm endometrium.    Review of Systems She has not had a pap smear for about 10 years. Her mammogram was last 10/18. FH- + breast in maternal aunt in her 53s, no other cancers in [place].    Objective:   Physical Exam Breathing, conversing, and ambulating normally Obese, well hydrated White female, no apparent distress  UPT negative, consent signed, time out done Cervix prepped with betadine and grasped with a single tooth tenaculum Uterus sounded to 8 cm Pipelle used for 2 passes with a moderate amount of tissue obtained. She tolerated the procedure well.      Assessment & Plan:  Preventative- pap smear today PMB- embx Come back 1 week for results

## 2018-05-25 NOTE — Addendum Note (Signed)
Addended by: Shelly Coss on: 05/25/2018 09:33 AM   Modules accepted: Orders

## 2018-05-26 ENCOUNTER — Ambulatory Visit: Payer: BLUE CROSS/BLUE SHIELD | Admitting: Family Medicine

## 2018-05-27 LAB — CYTOLOGY - PAP
Adequacy: ABSENT
Diagnosis: NEGATIVE
HPV: NOT DETECTED

## 2018-06-01 ENCOUNTER — Telehealth: Payer: Self-pay

## 2018-06-01 NOTE — Telephone Encounter (Signed)
Pt called requesting test results from last Monday.   Informed pt that her results are normal and that front office give a call to schedule her an appt.  Pt stated understanding with no further questions.

## 2018-06-02 ENCOUNTER — Ambulatory Visit (HOSPITAL_COMMUNITY): Payer: Self-pay

## 2018-06-03 ENCOUNTER — Encounter: Payer: Self-pay | Admitting: *Deleted

## 2018-06-09 ENCOUNTER — Ambulatory Visit (INDEPENDENT_AMBULATORY_CARE_PROVIDER_SITE_OTHER): Payer: Self-pay | Admitting: Obstetrics & Gynecology

## 2018-06-09 ENCOUNTER — Encounter: Payer: Self-pay | Admitting: Obstetrics & Gynecology

## 2018-06-09 ENCOUNTER — Encounter (HOSPITAL_COMMUNITY): Payer: Self-pay

## 2018-06-09 VITALS — BP 147/68 | HR 77 | Wt 223.4 lb

## 2018-06-09 DIAGNOSIS — N950 Postmenopausal bleeding: Secondary | ICD-10-CM

## 2018-06-09 DIAGNOSIS — R9389 Abnormal findings on diagnostic imaging of other specified body structures: Secondary | ICD-10-CM

## 2018-06-09 NOTE — Progress Notes (Signed)
   Subjective:    Patient ID: Rhonda Hudson, female    DOB: [birth year], 62 y.o.   MRN: [id number]  HPI 62 yo divorced/single P3 here for follow up. She had an EMBX about 2 weeks ago for PMB and a 21 mm endometrium. Her pathology was negative. She is on xeralto.   Review of Systems     Objective:   Physical Exam Breathing, conversing, and ambulating normally Well nourished, well hydrated White female, no apparent distress Abd- benign     Assessment & Plan:  Due to thickness of endometrium, I am rec'ing a d&c. I sent [name] a message to schedule this.

## 2018-06-10 ENCOUNTER — Other Ambulatory Visit: Payer: Self-pay

## 2018-06-10 ENCOUNTER — Encounter (HOSPITAL_BASED_OUTPATIENT_CLINIC_OR_DEPARTMENT_OTHER): Payer: Self-pay | Admitting: *Deleted

## 2018-06-10 NOTE — Progress Notes (Signed)
Patient is scheduled 06-16-18 for D&C with Dr Hulan Fray and was told by her that she did not need to stop her Xarelto prior to surgery. She takes this for DVT's.

## 2018-06-16 ENCOUNTER — Ambulatory Visit (HOSPITAL_BASED_OUTPATIENT_CLINIC_OR_DEPARTMENT_OTHER): Payer: Self-pay | Admitting: Anesthesiology

## 2018-06-16 ENCOUNTER — Encounter (HOSPITAL_BASED_OUTPATIENT_CLINIC_OR_DEPARTMENT_OTHER)
Admission: RE | Disposition: A | Discharge status: Home / Self Care | Payer: Self-pay | Source: Ambulatory Visit | Attending: Obstetrics & Gynecology

## 2018-06-16 ENCOUNTER — Ambulatory Visit (HOSPITAL_BASED_OUTPATIENT_CLINIC_OR_DEPARTMENT_OTHER)
Admission: RE | Admit: 2018-06-16 | Discharge: 2018-06-16 | Disposition: A | Discharge status: Home / Self Care | Payer: Self-pay | Source: Ambulatory Visit | Attending: Obstetrics & Gynecology | Admitting: Obstetrics & Gynecology

## 2018-06-16 ENCOUNTER — Encounter (HOSPITAL_BASED_OUTPATIENT_CLINIC_OR_DEPARTMENT_OTHER): Payer: Self-pay | Admitting: Anesthesiology

## 2018-06-16 ENCOUNTER — Other Ambulatory Visit: Payer: Self-pay

## 2018-06-16 DIAGNOSIS — K219 Gastro-esophageal reflux disease without esophagitis: Secondary | ICD-10-CM | POA: Insufficient documentation

## 2018-06-16 DIAGNOSIS — G4700 Insomnia, unspecified: Secondary | ICD-10-CM | POA: Insufficient documentation

## 2018-06-16 DIAGNOSIS — N950 Postmenopausal bleeding: Secondary | ICD-10-CM | POA: Insufficient documentation

## 2018-06-16 DIAGNOSIS — Z86718 Personal history of other venous thrombosis and embolism: Secondary | ICD-10-CM | POA: Insufficient documentation

## 2018-06-16 DIAGNOSIS — F17210 Nicotine dependence, cigarettes, uncomplicated: Secondary | ICD-10-CM | POA: Insufficient documentation

## 2018-06-16 DIAGNOSIS — Z7901 Long term (current) use of anticoagulants: Secondary | ICD-10-CM | POA: Insufficient documentation

## 2018-06-16 DIAGNOSIS — D682 Hereditary deficiency of other clotting factors: Secondary | ICD-10-CM | POA: Insufficient documentation

## 2018-06-16 DIAGNOSIS — Z79899 Other long term (current) drug therapy: Secondary | ICD-10-CM | POA: Insufficient documentation

## 2018-06-16 DIAGNOSIS — K589 Irritable bowel syndrome without diarrhea: Secondary | ICD-10-CM | POA: Insufficient documentation

## 2018-06-16 DIAGNOSIS — R9389 Abnormal findings on diagnostic imaging of other specified body structures: Secondary | ICD-10-CM | POA: Insufficient documentation

## 2018-06-16 DIAGNOSIS — R7303 Prediabetes: Secondary | ICD-10-CM | POA: Insufficient documentation

## 2018-06-16 HISTORY — DX: Prediabetes: R73.03

## 2018-06-16 HISTORY — PX: DILATION AND CURETTAGE OF UTERUS: SHX78

## 2018-06-16 HISTORY — DX: Abnormal findings on diagnostic imaging of other specified body structures: R93.89

## 2018-06-16 SURGERY — DILATION AND CURETTAGE
Anesthesia: General | Site: Perineum

## 2018-06-16 MED ORDER — DEXAMETHASONE SODIUM PHOSPHATE 10 MG/ML IJ SOLN
INTRAMUSCULAR | Status: AC
  Filled 2018-06-16: qty 1

## 2018-06-16 MED ORDER — LIDOCAINE 2% (20 MG/ML) 5 ML SYRINGE
INTRAMUSCULAR | Status: AC
  Filled 2018-06-16: qty 5

## 2018-06-16 MED ORDER — SILVER NITRATE-POT NITRATE 75-25 % EX MISC
CUTANEOUS | Status: AC
  Filled 2018-06-16: qty 1

## 2018-06-16 MED ORDER — MIDAZOLAM HCL 2 MG/2ML IJ SOLN
1.0000 mg | INTRAMUSCULAR | Status: DC | PRN
  Administered 2018-06-16: 2 mg via INTRAVENOUS

## 2018-06-16 MED ORDER — DEXAMETHASONE SODIUM PHOSPHATE 4 MG/ML IJ SOLN
INTRAMUSCULAR | Status: DC | PRN
  Administered 2018-06-16: 10 mg via INTRAVENOUS

## 2018-06-16 MED ORDER — FENTANYL CITRATE (PF) 100 MCG/2ML IJ SOLN
INTRAMUSCULAR | Status: AC
  Filled 2018-06-16: qty 2

## 2018-06-16 MED ORDER — LACTATED RINGERS IV SOLN
INTRAVENOUS | Status: DC
  Administered 2018-06-16: 07:00:00 via INTRAVENOUS

## 2018-06-16 MED ORDER — MIDAZOLAM HCL 2 MG/2ML IJ SOLN
INTRAMUSCULAR | Status: AC
  Filled 2018-06-16: qty 2

## 2018-06-16 MED ORDER — ONDANSETRON HCL 4 MG/2ML IJ SOLN
INTRAMUSCULAR | Status: AC
  Filled 2018-06-16: qty 2

## 2018-06-16 MED ORDER — PROPOFOL 10 MG/ML IV BOLUS
INTRAVENOUS | Status: DC | PRN
  Administered 2018-06-16: 30 mg via INTRAVENOUS
  Administered 2018-06-16: 150 mg via INTRAVENOUS

## 2018-06-16 MED ORDER — ONDANSETRON HCL 4 MG/2ML IJ SOLN
INTRAMUSCULAR | Status: DC | PRN
  Administered 2018-06-16: 4 mg via INTRAVENOUS

## 2018-06-16 MED ORDER — FENTANYL CITRATE (PF) 100 MCG/2ML IJ SOLN
50.0000 ug | INTRAMUSCULAR | Status: DC | PRN
  Administered 2018-06-16 (×2): 50 ug via INTRAVENOUS

## 2018-06-16 MED ORDER — SCOPOLAMINE 1 MG/3DAYS TD PT72: 1.0000 | MEDICATED_PATCH | Freq: Once | TRANSDERMAL | Status: DC | PRN

## 2018-06-16 MED ORDER — PROPOFOL 500 MG/50ML IV EMUL
INTRAVENOUS | Status: AC
  Filled 2018-06-16: qty 50

## 2018-06-16 MED ORDER — BUPIVACAINE HCL 0.5 % IJ SOLN
INTRAMUSCULAR | Status: DC | PRN
  Administered 2018-06-16: 30 mL

## 2018-06-16 MED ORDER — FENTANYL CITRATE (PF) 100 MCG/2ML IJ SOLN: 25.0000 ug | INTRAMUSCULAR | Status: DC | PRN

## 2018-06-16 MED ORDER — BUPIVACAINE HCL (PF) 0.5 % IJ SOLN
INTRAMUSCULAR | Status: AC
  Filled 2018-06-16: qty 30

## 2018-06-16 MED ORDER — LIDOCAINE HCL (CARDIAC) PF 100 MG/5ML IV SOSY
PREFILLED_SYRINGE | INTRAVENOUS | Status: DC | PRN
  Administered 2018-06-16: 100 mg via INTRAVENOUS

## 2018-06-16 SURGICAL SUPPLY — 15 items
BRIEF STRETCH FOR OB PAD XXL (UNDERPADS AND DIAPERS) ×3 IMPLANT
DILATOR CANAL MILEX (MISCELLANEOUS) IMPLANT
GLOVE BIO SURGEON STRL SZ 6.5 (GLOVE) ×2 IMPLANT
GLOVE BIO SURGEONS STRL SZ 6.5 (GLOVE) ×1
GLOVE BIOGEL PI IND STRL 7.0 (GLOVE) ×1 IMPLANT
GLOVE BIOGEL PI INDICATOR 7.0 (GLOVE) ×2
GOWN STRL REUS W/ TWL XL LVL3 (GOWN DISPOSABLE) ×1 IMPLANT
GOWN STRL REUS W/TWL LRG LVL3 (GOWN DISPOSABLE) ×3 IMPLANT
GOWN STRL REUS W/TWL XL LVL3 (GOWN DISPOSABLE) ×3
NEEDLE SPNL 18GX3.5 QUINCKE PK (NEEDLE) ×3 IMPLANT
PACK VAGINAL MINOR WOMEN LF (CUSTOM PROCEDURE TRAY) ×3 IMPLANT
PAD OB MATERNITY 4.3X12.25 (PERSONAL CARE ITEMS) ×3 IMPLANT
PAD PREP 24X48 CUFFED NSTRL (MISCELLANEOUS) ×3 IMPLANT
SLEEVE SCD COMPRESS KNEE MED (MISCELLANEOUS) ×3 IMPLANT
TOWEL GREEN STERILE FF (TOWEL DISPOSABLE) ×6 IMPLANT

## 2018-06-16 NOTE — Anesthesia Postprocedure Evaluation (Signed)
Anesthesia Post Note  Patient: Rhonda Hudson  Procedure(s) Performed: DILATATION AND CURETTAGE (N/A Perineum)     Patient location during evaluation: PACU Anesthesia Type: General Level of consciousness: awake and alert Pain management: pain level controlled Vital Signs Assessment: post-procedure vital signs reviewed and stable Respiratory status: spontaneous breathing, nonlabored ventilation, respiratory function stable and patient connected to nasal cannula oxygen Cardiovascular status: blood pressure returned to baseline and stable Postop Assessment: no apparent nausea or vomiting Anesthetic complications: no    Last Vitals:  Vitals:   06/16/18 0845 06/16/18 0915  BP: 130/67 130/66  Pulse: 71 66  Resp: 16 16  Temp:    SpO2: 95% 98%    Last Pain:  Vitals:   06/16/18 0915  TempSrc:   PainSc: 0-No pain                 Chelsey L Woodrum

## 2018-06-16 NOTE — Discharge Instructions (Signed)
Dilation and Curettage or Vacuum Curettage, Care After °This sheet gives you information about how to care for yourself after your procedure. Your health care provider may also give you more specific instructions. If you have problems or questions, contact your health care provider. °What can I expect after the procedure? °After your procedure, it is common to have: °· Mild pain or cramping. °· Some vaginal bleeding or spotting. ° °These may last for up to 2 weeks after your procedure. °Follow these instructions at home: °Activity ° °· Do not drive or use heavy machinery while taking prescription pain medicine. °· Avoid driving for the first 24 hours after your procedure. °· Take frequent, short walks, followed by rest periods, throughout the day. Ask your health care provider what activities are safe for you. After 1-2 days, you may be able to return to your normal activities. °· Do not lift anything heavier than 10 lb (4.5 kg) until your health care provider approves. °· For at least 2 weeks, or as long as told by your health care provider, do not: °? Douche. °? Use tampons. °? Have sexual intercourse. °General instructions ° °· Take over-the-counter and prescription medicines only as told by your health care provider. This is especially important if you take blood thinning medicine. °· Do not take baths, swim, or use a hot tub until your health care provider approves. Take showers instead of baths. °· Wear compression stockings as told by your health care provider. These stockings help to prevent blood clots and reduce swelling in your legs. °· It is your responsibility to get the results of your procedure. Ask your health care provider, or the department performing the procedure, when your results will be ready. °· Keep all follow-up visits as told by your health care provider. This is important. °Contact a health care provider if: °· You have severe cramps that get worse or that do not get better with  medicine. °· You have severe abdominal pain. °· You cannot drink fluids without vomiting. °· You develop pain in a different area of your pelvis. °· You have bad-smelling vaginal discharge. °· You have a rash. °Get help right away if: °· You have vaginal bleeding that soaks more than one sanitary pad in 1 hour, for 2 hours in a row. °· You pass large blood clots from your vagina. °· You have a fever that is above 100.4°F (38.0°C). °· Your abdomen feels very tender or hard. °· You have chest pain. °· You have shortness of breath. °· You cough up blood. °· You feel dizzy or light-headed. °· You faint. °· You have pain in your neck or shoulder area. °This information is not intended to replace advice given to you by your health care provider. Make sure you discuss any questions you have with your health care provider. °Document Released: 08/30/2000 Document Revised: 05/01/2016 Document Reviewed: 04/04/2016 °Elsevier Interactive Patient Education © 2018 [employer] ° ° °Post Anesthesia Home Care Instructions ° °Activity: °Get plenty of rest for the remainder of the day. A responsible individual must stay with you for 24 hours following the procedure.  °For the next 24 hours, DO NOT: °-Drive a car °-Operate machinery °-Drink alcoholic beverages °-Take any medication unless instructed by your physician °-Make any legal decisions or sign important papers. ° °Meals: °Start with liquid foods such as gelatin or soup. Progress to regular foods as tolerated. Avoid greasy, spicy, heavy foods. If nausea and/or vomiting occur, drink only clear liquids until the nausea   and/or vomiting subsides. Call your physician if vomiting continues. ° °Special Instructions/Symptoms: °Your throat may feel dry or sore from the anesthesia or the breathing tube placed in your throat during surgery. If this causes discomfort, gargle with warm salt water. The discomfort should disappear within 24 hours. ° °If you had a scopolamine patch placed  behind your ear for the management of post- operative nausea and/or vomiting: ° °1. The medication in the patch is effective for 72 hours, after which it should be removed.  Wrap patch in a tissue and discard in the trash. Wash hands thoroughly with soap and water. °2. You may remove the patch earlier than 72 hours if you experience unpleasant side effects which may include dry mouth, dizziness or visual disturbances. °3. Avoid touching the patch. Wash your hands with soap and water after contact with the patch. °  ° ° °

## 2018-06-16 NOTE — H&P (Signed)
  62 yo divorced/single P3 here for follow up. She had an EMBX about 4 weeks ago for PMB and a 21 mm endometrium. Her pathology was negative. She is on xeralto. Her last dose was last night. She will start her next dose tonight. Of note, her PMB stopped recently.   No LMP recorded. Patient is postmenopausal.    Past Medical History:  Diagnosis Date  . Cataracts, bilateral   . Chronic anticoagulation 07/15/2017  . Diverticulitis   . DVT (deep venous thrombosis) ([place])   . DVT of lower extremity (deep venous thrombosis) ([place]) 01/04/2016  . 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 Hiatal hernia with gastroesophageal reflux    disease  . History of prediabetes   . Insomnia   . Irritable bowel syndrome   . Panic attacks    Hx of  . Pre-diabetes   . Thickened endometrium     Past Surgical History:  Procedure Laterality Date  . CHOLECYSTECTOMY    . TOE SURGERY    . TUBAL LIGATION    . US ECHOCARDIOGRAPHY  01-31-2009   EF 60%    Family History  Problem Relation Age of Onset  . Leukemia Father   . Leukemia Sister   . Leukemia Brother   . Multiple sclerosis Neg Hx   . Autoimmune disease Neg Hx     Social History:  reports that she has been smoking cigarettes. She has been smoking about 0.50 packs per day. She has never used smokeless tobacco. She reports that she does not drink alcohol or use drugs.  Allergies: No Known Allergies  Medications Prior to Admission  Medication Sig Dispense Refill Last Dose  . rivaroxaban (XARELTO) 20 MG TABS tablet TAKE 1 TABLET BY MOUTH  DAILY WITH SUPPER 28 tablet 0 06/14/2018 at Unknown time    ROS  Blood pressure (!) 121/58, pulse 71, temperature 98.5 F (36.9 C), temperature source Oral, height 5\' 7"  (1.702 m), weight 101.2 kg. Physical Exam  Breathing, conversing, and ambulating normally Well nourished, well hydrated White female, no apparent distress Heart- rrr Lungs- CTAB Abd- benign  No results found for this or any previous visit (from  the past 24 hour(s)).  No results found.  Assessment/Plan: PMB, thickened endometrium- plan for d&c.  She understands the risks of surgery, including, but not to infection, bleeding, DVTs, damage to bowel, bladder, ureters. She wishes to proceed.     Schon Zeiders C Bodhi Stenglein 06/16/2018, 7:04 AM

## 2018-06-16 NOTE — Transfer of Care (Signed)
Immediate Anesthesia Transfer of Care Note  Patient: KIEU QUIGGLE  Procedure(s) Performed: DILATATION AND CURETTAGE (N/A Perineum)  Patient Location: PACU  Anesthesia Type:General  Level of Consciousness: sedated  Airway & Oxygen Therapy: Patient Spontanous Breathing and Patient connected to face mask oxygen  Post-op Assessment: Report given to RN and Post -op Vital signs reviewed and stable  Post vital signs: Reviewed and stable  Last Vitals:  Vitals Value Taken Time  BP    Temp    Pulse 67 06/16/2018  8:04 AM  Resp    SpO2 100 % 06/16/2018  8:04 AM  Vitals shown include unvalidated device data.  Last Pain:  Vitals:   06/16/18 0659  TempSrc: Oral  PainSc: 0-No pain         Complications: No apparent anesthesia complications

## 2018-06-16 NOTE — Anesthesia Procedure Notes (Signed)
Procedure Name: LMA Insertion Date/Time: 06/16/2018 7:38 AM Performed by: Maryella Shivers, CRNA Pre-anesthesia Checklist: Patient identified, Emergency Drugs available, Suction available and Patient being monitored Patient Re-evaluated:Patient Re-evaluated prior to induction Oxygen Delivery Method: Circle system utilized Preoxygenation: Pre-oxygenation with 100% oxygen Induction Type: IV induction Ventilation: Mask ventilation without difficulty LMA: LMA inserted LMA Size: 4.0 Number of attempts: 1 Airway Equipment and Method: Bite block Placement Confirmation: positive ETCO2 Tube secured with: Tape Dental Injury: Teeth and Oropharynx as per pre-operative assessment

## 2018-06-16 NOTE — Anesthesia Preprocedure Evaluation (Addendum)
Anesthesia Evaluation  Patient identified by MRN, date of birth, ID band Patient awake    Reviewed: Allergy & Precautions, NPO status , Patient's Chart, lab work & pertinent test results  Airway Mallampati: III  TM Distance: >3 FB Neck ROM: Full  Mouth opening: Limited Mouth Opening  Dental  (+) Missing,    Pulmonary Current Smoker,    breath sounds clear to auscultation       Cardiovascular + DVT (on xarelto, last dose on Sunday)   Rhythm:Regular Rate:Normal     Neuro/Psych Anxiety negative neurological ROS     GI/Hepatic Neg liver ROS, hiatal hernia, GERD  ,  Endo/Other  negative endocrine ROS  Renal/GU negative Renal ROS  negative genitourinary   Musculoskeletal negative musculoskeletal ROS (+)   Abdominal   Peds  Hematology negative hematology ROS (+) Factor II deficiency   Anesthesia Other Findings D&C for thickened endometrium  Reproductive/Obstetrics                           Anesthesia Physical Anesthesia Plan  ASA: III  Anesthesia Plan: General   Post-op Pain Management:    Induction: Intravenous  PONV Risk Score and Plan: 2 and Dexamethasone, Ondansetron and Midazolam  Airway Management Planned: LMA and Oral ETT  Additional Equipment:   Intra-op Plan:   Post-operative Plan: Extubation in OR  Informed Consent: I have reviewed the patients History and Physical, chart, labs and discussed the procedure including the risks, benefits and alternatives for the proposed anesthesia with the patient or authorized representative who has indicated his/her understanding and acceptance.   Dental advisory given  Plan Discussed with: CRNA  Anesthesia Plan Comments:        Anesthesia Quick Evaluation

## 2018-06-16 NOTE — Op Note (Signed)
06/16/2018  7:55 AM  PATIENT:  Rhonda Hudson  61 y.o. female  PRE-OPERATIVE DIAGNOSIS:  Thickened Endometrium, PMB  POST-OPERATIVE DIAGNOSIS: same  PROCEDURE:  Procedure(s): DILATATION AND CURETTAGE (N/A)  SURGEON:  Surgeon(s) and Role:    * Beverly Ferner C, MD - Primary  ASSISTANTS: none   ANESTHESIA:   local and general  EBL: minimal  BLOOD ADMINISTERED:none  DRAINS: none   LOCAL MEDICATIONS USED:  MARCAINE     SPECIMEN:  Source of Specimen:  uterine curettings  DISPOSITION OF SPECIMEN:  PATHOLOGY  COUNTS:  YES  TOURNIQUET:  * No tourniquets in log *  DICTATION: .Dragon Dictation  PLAN OF CARE: Discharge to home after PACU  PATIENT DISPOSITION:  PACU - hemodynamically stable.   Delay start of Pharmacological VTE agent (>24hrs) due to surgical blood loss or risk of bleeding: not applicable    The risks, benefits, and alternatives of surgery were explained, understood, and accepted. All questions were answered. Consents were signed. In the operating room general anesthesia was applied without complication, and she was placed in the dorsal lithotomy position. Her vagina was prepped and draped in the usual sterile fashion.  A bimanual exam revealed a upper limit of normal size retroverted mobile uterus. Her adnexa were nonenlarged. A speculum was placed and a single-tooth tenaculum was used to grasp the posterior lip of her cervix. A total of 30 mL of 0.5% Marcaine was used to perform a paracervical block. Her uterus sounded to 11 cm. Her cervix was carefully and slowly dilated to accommodate a small curette. A curettage was done in all quadrants and the fundus of the uterus. A moderate amount of polypoid-type  tissue was obtained. A gritty sensation was appreciated throughout. There was no bleeding noted at the end of the case. She was taken to the recovery room after being extubated. She tolerated the procedure well.

## 2018-06-17 ENCOUNTER — Encounter (HOSPITAL_BASED_OUTPATIENT_CLINIC_OR_DEPARTMENT_OTHER): Payer: Self-pay | Admitting: Obstetrics & Gynecology

## 2018-07-03 ENCOUNTER — Ambulatory Visit: Payer: Self-pay | Admitting: Obstetrics & Gynecology

## 2018-07-27 ENCOUNTER — Telehealth (HOSPITAL_COMMUNITY): Payer: Self-pay | Admitting: Obstetrics and Gynecology

## 2018-07-27 NOTE — Telephone Encounter (Signed)
Called patient and left message regarding scheduling screening mammogram.  °

## 2018-07-29 ENCOUNTER — Encounter: Payer: Self-pay | Admitting: Obstetrics and Gynecology

## 2018-07-29 ENCOUNTER — Ambulatory Visit (INDEPENDENT_AMBULATORY_CARE_PROVIDER_SITE_OTHER): Payer: Self-pay | Admitting: Obstetrics and Gynecology

## 2018-07-29 VITALS — BP 136/75 | HR 79 | Wt 219.1 lb

## 2018-07-29 DIAGNOSIS — Z712 Person consulting for explanation of examination or test findings: Secondary | ICD-10-CM

## 2018-07-29 NOTE — Progress Notes (Signed)
62 yo P3 here to discuss results of endometrial sampling performed on 10/1 due to PMB and thickened endometrium. Patient reports heavy vaginal bleeding for 5 days following the procedure. She has not had an episode of vaginal bleeding or pelvic pain since. She reports doing well  Past Medical History:  Diagnosis Date  . Cataracts, bilateral   . Chronic anticoagulation 07/15/2017  . Diverticulitis   . DVT (deep venous thrombosis) ([place])   . DVT of lower extremity (deep venous thrombosis) ([place]) 01/04/2016  . 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 Hiatal hernia with gastroesophageal reflux    disease  . History of prediabetes   . Insomnia   . Irritable bowel syndrome   . Panic attacks    Hx of  . Pre-diabetes   . Thickened endometrium    Past Surgical History:  Procedure Laterality Date  . CHOLECYSTECTOMY    . DILATION AND CURETTAGE OF UTERUS N/A 06/16/2018   Procedure: DILATATION AND CURETTAGE;  Surgeon: Emily Filbert, MD;  Location: [place];  Service: Gynecology;  Laterality: N/A;  . TOE SURGERY    . TUBAL LIGATION    . US ECHOCARDIOGRAPHY  01-31-2009   EF 60%   Family History  Problem Relation Age of Onset  . Leukemia Father   . Leukemia Sister   . Leukemia Brother   . Multiple sclerosis Neg Hx   . Autoimmune disease Neg Hx    Social History   Tobacco Use  . Smoking status: Current Some Day Smoker    Packs/day: 0.50    Types: Cigarettes  . Smokeless tobacco: Never Used  Substance Use Topics  . Alcohol use: No    Alcohol/week: 0.0 standard drinks  . Drug use: No   ROS See pertinent in HPI  Blood pressure 136/75, pulse 79, weight 219 lb 1.6 oz (99.4 kg). GENERAL: Well-developed, well-nourished female in no acute distress.  NEURO: alert and oriented x3  10/1 pathology Benign proliferative endometrium without atypia  A/P 62 yo s/p D&C on 10/1 for PMB  - pathology results reviewed with the patient - Patient advised to return with any future episodes of  postmenopausal vaginal bleeding - RTC prn

## 2018-08-10 ENCOUNTER — Telehealth (HOSPITAL_COMMUNITY): Payer: Self-pay | Admitting: Obstetrics and Gynecology

## 2018-08-10 NOTE — Telephone Encounter (Signed)
Called patient and left voicemail regarding re-scheduling appt with [facility].

## 2018-10-13 ENCOUNTER — Telehealth: Payer: Self-pay

## 2018-10-13 NOTE — Telephone Encounter (Signed)
As long as she tolerates Tylenol without any issues then this is ok temporarily. She could also try over the counter melatonin. She can come in to discuss sleep issues if she would like.

## 2018-10-13 NOTE — Telephone Encounter (Signed)
Patient called and wants to know if it is ok for her to take Tylenol PM because she is not able to sleep. Please advise.

## 2018-10-14 NOTE — Telephone Encounter (Signed)
Patient informed of providers message. She stated she will probably call back to schedule an appointment.

## 2018-11-17 ENCOUNTER — Other Ambulatory Visit: Payer: Self-pay | Admitting: Family Medicine

## 2018-11-18 NOTE — Telephone Encounter (Signed)
Per note from [place] back in 05/2018. [name] approved 11 refills

## 2018-12-14 ENCOUNTER — Other Ambulatory Visit: Payer: Self-pay | Admitting: Family Medicine

## 2018-12-14 NOTE — Telephone Encounter (Signed)
Is this okay to refill? 

## 2019-01-14 ENCOUNTER — Other Ambulatory Visit: Payer: Self-pay | Admitting: Family Medicine

## 2019-01-14 ENCOUNTER — Telehealth: Payer: Self-pay | Admitting: Family Medicine

## 2019-01-14 MED ORDER — RIVAROXABAN 20 MG PO TABS: ORAL_TABLET | ORAL | 0 refills | Status: DC

## 2019-01-14 NOTE — Telephone Encounter (Signed)
Left detailed message that pt needs a virtual visit and to call back to schedule

## 2019-01-14 NOTE — Telephone Encounter (Signed)
Ok to refill her Xarelto, she has an appointment Monday I see.

## 2019-01-14 NOTE — Telephone Encounter (Signed)
done

## 2019-01-14 NOTE — Telephone Encounter (Signed)
Pt left message needs refill Xarelto, I left message for pt that she needs appt

## 2019-01-18 ENCOUNTER — Ambulatory Visit (INDEPENDENT_AMBULATORY_CARE_PROVIDER_SITE_OTHER): Payer: Self-pay | Admitting: Family Medicine

## 2019-01-18 ENCOUNTER — Other Ambulatory Visit: Payer: Self-pay

## 2019-01-18 ENCOUNTER — Encounter: Payer: Self-pay | Admitting: Family Medicine

## 2019-01-18 VITALS — BP 128/94 | HR 93 | Temp 97.6°F

## 2019-01-18 DIAGNOSIS — Z9119 Patient's noncompliance with other medical treatment and regimen: Secondary | ICD-10-CM

## 2019-01-18 DIAGNOSIS — Z91199 Patient's noncompliance with other medical treatment and regimen due to unspecified reason: Secondary | ICD-10-CM

## 2019-01-18 DIAGNOSIS — D682 Hereditary deficiency of other clotting factors: Secondary | ICD-10-CM

## 2019-01-18 DIAGNOSIS — Z7901 Long term (current) use of anticoagulants: Secondary | ICD-10-CM

## 2019-01-18 DIAGNOSIS — R7309 Other abnormal glucose: Secondary | ICD-10-CM

## 2019-01-18 DIAGNOSIS — Z8639 Personal history of other endocrine, nutritional and metabolic disease: Secondary | ICD-10-CM

## 2019-01-18 DIAGNOSIS — F17200 Nicotine dependence, unspecified, uncomplicated: Secondary | ICD-10-CM
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
## 2019-01-20 ENCOUNTER — Other Ambulatory Visit (INDEPENDENT_AMBULATORY_CARE_PROVIDER_SITE_OTHER): Payer: Self-pay

## 2019-01-20 DIAGNOSIS — Z8639 Personal history of other endocrine, nutritional and metabolic disease: Secondary | ICD-10-CM

## 2019-01-20 DIAGNOSIS — R7309 Other abnormal glucose: Secondary | ICD-10-CM

## 2019-01-20 LAB — POCT URINALYSIS DIP (PROADVANTAGE DEVICE)
Bilirubin, UA: NEGATIVE
Blood, UA: NEGATIVE
Glucose, UA: NEGATIVE mg/dL
Ketones, POC UA: NEGATIVE mg/dL
Leukocytes, UA: NEGATIVE
Nitrite, UA: NEGATIVE
Protein Ur, POC: NEGATIVE mg/dL
Specific Gravity, Urine: 1.01
Urobilinogen, Ur: NEGATIVE
pH, UA: 6 (ref 5.0–8.0)

## 2019-01-21 LAB — CBC WITH DIFFERENTIAL/PLATELET
Basophils Absolute: 0.1 10*3/uL (ref 0.0–0.2)
Basos: 1 %
EOS (ABSOLUTE): 0.1 10*3/uL (ref 0.0–0.4)
Eos: 1 %
Hematocrit: 44.4 % (ref 34.0–46.6)
Hemoglobin: 14.9 g/dL (ref 11.1–15.9)
Immature Grans (Abs): 0 10*3/uL (ref 0.0–0.1)
Immature Granulocytes: 0 %
Lymphocytes Absolute: 2.6 10*3/uL (ref 0.7–3.1)
Lymphs: 24 %
MCH: 29.1 pg (ref 26.6–33.0)
MCHC: 33.6 g/dL (ref 31.5–35.7)
MCV: 87 fL (ref 79–97)
Monocytes Absolute: 0.6 10*3/uL (ref 0.1–0.9)
Monocytes: 6 %
Neutrophils Absolute: 7.5 10*3/uL — ABNORMAL HIGH (ref 1.4–7.0)
Neutrophils: 68 %
Platelets: 224 10*3/uL (ref 150–450)
RBC: 5.12 x10E6/uL (ref 3.77–5.28)
RDW: 13.2 % (ref 11.7–15.4)
WBC: 10.9 10*3/uL — ABNORMAL HIGH (ref 3.4–10.8)

## 2019-01-21 LAB — COMPREHENSIVE METABOLIC PANEL
ALT: 14 IU/L (ref 0–32)
AST: 12 IU/L (ref 0–40)
Albumin/Globulin Ratio: 1.6 (ref 1.2–2.2)
Albumin: 4.1 g/dL (ref 3.8–4.8)
Alkaline Phosphatase: 86 IU/L (ref 39–117)
BUN/Creatinine Ratio: 10 — ABNORMAL LOW (ref 12–28)
BUN: 10 mg/dL (ref 8–27)
Bilirubin Total: 0.4 mg/dL (ref 0.0–1.2)
CO2: 19 mmol/L — ABNORMAL LOW (ref 20–29)
Calcium: 9.5 mg/dL (ref 8.7–10.3)
Chloride: 99 mmol/L (ref 96–106)
Creatinine, Ser: 1.01 mg/dL — ABNORMAL HIGH (ref 0.57–1.00)
GFR calc Af Amer: 68 mL/min/{1.73_m2} (ref >59)
GFR calc non Af Amer: 59 mL/min/{1.73_m2} — ABNORMAL LOW (ref >59)
Globulin, Total: 2.6 g/dL (ref 1.5–4.5)
Glucose: 105 mg/dL — ABNORMAL HIGH (ref 65–99)
Potassium: 4.7 mmol/L (ref 3.5–5.2)
Sodium: 136 mmol/L (ref 134–144)
Total Protein: 6.7 g/dL (ref 6.0–8.5)

## 2019-01-21 LAB — LIPID PANEL
Chol/HDL Ratio: 4.6 ratio — ABNORMAL HIGH (ref 0.0–4.4)
Cholesterol, Total: 198 mg/dL (ref 100–199)
HDL: 43 mg/dL (ref >39)
LDL Calculated: 134 mg/dL — ABNORMAL HIGH (ref 0–99)
Triglycerides: 105 mg/dL (ref 0–149)
VLDL Cholesterol Cal: 21 mg/dL (ref 5–40)

## 2019-01-21 LAB — TSH: TSH: 0.846 u[IU]/mL (ref 0.450–4.500)

## 2019-01-21 LAB — MICROALBUMIN / CREATININE URINE RATIO
Creatinine, Urine: 52.2 mg/dL
Microalb/Creat Ratio: <6 mg/g creat (ref 0–29)
Microalbumin, Urine: <3 ug/mL

## 2019-01-21 LAB — HEMOGLOBIN A1C
Est. average glucose Bld gHb Est-mCnc: 143 mg/dL
Hgb A1c MFr Bld: 6.6 % — ABNORMAL HIGH (ref 4.8–5.6)

## 2019-01-21 LAB — T4, FREE: Free T4: 1.26 ng/dL (ref 0.82–1.77)

## 2019-01-25 ENCOUNTER — Telehealth: Payer: Self-pay | Admitting: Internal Medicine

## 2019-01-25 NOTE — Telephone Encounter (Signed)
Typically she will only need to be off of the Xarelto 1 to 2 days before a procedure and can start back generally a couple of days after the procedure.  I am not sure who is open doing emergency dental procedures. I recommend she call around to dentist offices and see who is doing emergency dental procedures. If she feels that she has an infection and needs an antibiotic she can let me know.

## 2019-01-25 NOTE — Telephone Encounter (Signed)
Pt called and left a voicemail saying that she has a tooth abscess on Friday and stopped her xarelto due to trying to find a dentist to see her. She wanted to know how long she could stay off her xarelto before she can find a dentist to take her. She does not have insurance.

## 2019-01-25 NOTE — Telephone Encounter (Signed)
Pt found a dentist and they will see her at 11:30am tomorrow and they told her to stay on xarelto so she started back taking it. I asked Dr. Redmond School if this was ok and he advised that she might bleed alittle more during procedure but it was ok to start taking it again if that's what they advised

## 2019-02-12 ENCOUNTER — Other Ambulatory Visit: Payer: Self-pay | Admitting: Family Medicine

## 2019-02-12 NOTE — Telephone Encounter (Signed)
Is this okay to refill? 

## 2019-03-01 ENCOUNTER — Ambulatory Visit (INDEPENDENT_AMBULATORY_CARE_PROVIDER_SITE_OTHER): Payer: Medicare HMO | Admitting: Family Medicine

## 2019-03-01 ENCOUNTER — Encounter: Payer: Self-pay | Admitting: Family Medicine

## 2019-03-01 ENCOUNTER — Other Ambulatory Visit: Payer: Self-pay

## 2019-03-01 VITALS — BP 120/60 | Temp 96.9°F | Resp 16

## 2019-03-01 DIAGNOSIS — R221 Localized swelling, mass and lump, neck: Secondary | ICD-10-CM

## 2019-03-01 DIAGNOSIS — R0609 Other forms of dyspnea: Secondary | ICD-10-CM

## 2019-03-01 DIAGNOSIS — R5383 Other fatigue: Secondary | ICD-10-CM

## 2019-03-01 DIAGNOSIS — R1310 Dysphagia, unspecified: Secondary | ICD-10-CM

## 2019-03-01 DIAGNOSIS — F17200 Nicotine dependence, unspecified, uncomplicated: Secondary | ICD-10-CM

## 2019-03-01 NOTE — Progress Notes (Signed)
   Subjective:   Documentation for virtual audio and video telecommunications through [place] encounter:  The patient was located at home. The provider was located in the office. The patient did consent to this visit and is aware of possible charges through their insurance for this visit.  The other persons participating in this telemedicine service were none.    Patient ID: Rhonda Hudson, female    DOB: Nov 30, 1955, 63 y.o.   MRN: [id number]  HPI Chief Complaint  Patient presents with  . trouble swallowing    trouble swallowing and 2 knots in throat, cigaette cough and but started coughing more   Complains of 5 day history of difficulty swallowing food and a new neck mass on the right side.  She also complains of fatigue and DOE that has been gradually worsening.  She is a smoker. States "I know I have something bad like COPD or emphysema".  Denies being short of breath at rest. No difficulty swallowing liquids. Denies voice changes or sore throat.   Reports gradual weight gain due to eating more and being sedentary.   History of goiter. Reports having biopsy in the past that was benign.   Denies fever, chills, night sweats, headaches, chest pain, palpitations, abdominal pain, N/V/D, urinary symptoms, LE edema.   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   Reviewed allergies, medications, past medical, surgical, family, and social history.    Review of Systems Pertinent positives and negatives in the history of present illness.     Objective:   Physical Exam BP 120/60   Temp (!) 96.9 F (36.1 C)   Resp 16   Alert and oriented and in no acute distress. Respirations unlabored. Normal speech, mood and thought process. I am able to visualize an enlargement on the right side of her neck.  Unable to further examine due to virtual visit.       Assessment & Plan:    Dysphagia, unspecified type - Plan: CT Soft Tissue Neck W Contrast. Discussed risk  factors for cancer including smoking. Discussed that if CT is negative our next step would be ENT vs GI referral. Advised her to eat soft foods or even just liquids until testing is done due to difficulty swallowing solid foods.   Mass of neck - Plan: CT neck   Fatigue, unspecified type - Plan: discussed need for labs, EKG and chest XR due to smoking history. She declines to come in for recommended testing even though I explained possible health consequences including cardiac, pulmonary and even death.   Localized swelling, mass or lump of neck - Plan: CT Soft Tissue Neck W Contrast  DOE (dyspnea on exertion) - Plan: discussed need for labs, EKG and chest XR due to smoking history. She refuses to come in for recommended testing even though I explained possible health consequences including cardiac, pulmonary and even death. States she feels overwhelmed and would like to start with getting her neck checked.   Smoker - Plan: encouraged her to stop smoking as I have done on multiple occasions in the past. She is not ready but knows she would benefit.   Follow up pending CT neck. I will encourage her to come in for EKG, labs and chest XR once again.   Time spent on call was 24 minutes and in review of previous records 4 minutes total.  This virtual service is not related to other E/M service within previous 7 days.  99441 (5-72min) 99442 (11-37min) 99443 (21-28min)

## 2019-03-02 ENCOUNTER — Other Ambulatory Visit: Payer: Self-pay

## 2019-03-02 ENCOUNTER — Ambulatory Visit (HOSPITAL_COMMUNITY)
Admission: RE | Admit: 2019-03-02 | Discharge: 2019-03-02 | Disposition: A | Discharge status: Home / Self Care | Payer: Medicare HMO | Source: Ambulatory Visit | Attending: Family Medicine | Admitting: Family Medicine

## 2019-03-02 ENCOUNTER — Telehealth: Payer: Self-pay | Admitting: Internal Medicine

## 2019-03-02 DIAGNOSIS — R221 Localized swelling, mass and lump, neck: Secondary | ICD-10-CM | POA: Insufficient documentation

## 2019-03-02 DIAGNOSIS — R1310 Dysphagia, unspecified: Secondary | ICD-10-CM | POA: Diagnosis not present

## 2019-03-02 DIAGNOSIS — E041 Nontoxic single thyroid nodule: Secondary | ICD-10-CM | POA: Diagnosis not present

## 2019-03-02 DIAGNOSIS — D110 Benign neoplasm of parotid gland: Secondary | ICD-10-CM | POA: Diagnosis not present

## 2019-03-02 MED ORDER — IOHEXOL 300 MG/ML  SOLN
75.0000 mL | Freq: Once | INTRAMUSCULAR | Status: AC | PRN
  Administered 2019-03-02: 15:00:00 75 mL via INTRAVENOUS

## 2019-03-02 NOTE — Telephone Encounter (Signed)
-----   Message from Patience Musca sent at 03/02/2019  9:09 AM EDT ----- [name] from [facility] called and states Keysi has a CT today at 3 and a Prior Auth is required for this CT.  Please call asap [phone] x [phone].  [name] states they will cancel CT if no prior auth soon.  I explained you were in class until 10 and she will let them know.  Please handle, Thanks, Beverlee Nims

## 2019-03-02 NOTE — Telephone Encounter (Signed)
This prior Josem Kaufmann has been done , # [id number] valid for 30 days. Called heather [phone] ext [phone] and left message of auth number

## 2019-03-03 ENCOUNTER — Encounter: Payer: Self-pay | Admitting: Family Medicine

## 2019-03-03 DIAGNOSIS — K118 Other diseases of salivary glands: Secondary | ICD-10-CM

## 2019-03-03 DIAGNOSIS — E041 Nontoxic single thyroid nodule: Secondary | ICD-10-CM | POA: Insufficient documentation

## 2019-03-03 HISTORY — DX: Other diseases of salivary glands: K11.8

## 2019-03-03 HISTORY — DX: Nontoxic single thyroid nodule: E04.1

## 2019-03-04 ENCOUNTER — Ambulatory Visit (INDEPENDENT_AMBULATORY_CARE_PROVIDER_SITE_OTHER): Payer: Medicare HMO | Admitting: Otolaryngology

## 2019-03-04 DIAGNOSIS — R1312 Dysphagia, oropharyngeal phase: Secondary | ICD-10-CM | POA: Diagnosis not present

## 2019-03-04 DIAGNOSIS — D440 Neoplasm of uncertain behavior of thyroid gland: Secondary | ICD-10-CM

## 2019-03-04 DIAGNOSIS — D37030 Neoplasm of uncertain behavior of the parotid salivary glands: Secondary | ICD-10-CM | POA: Diagnosis not present

## 2019-03-11 ENCOUNTER — Other Ambulatory Visit: Payer: Self-pay | Admitting: Family Medicine

## 2019-03-11 NOTE — Telephone Encounter (Signed)
Please send this in for 6 months.

## 2019-03-11 NOTE — Telephone Encounter (Signed)
Is this okay to refill? 

## 2019-03-13 ENCOUNTER — Other Ambulatory Visit: Payer: Self-pay

## 2019-03-13 ENCOUNTER — Emergency Department (HOSPITAL_COMMUNITY)
Admission: EM | Admit: 2019-03-13 | Discharge: 2019-03-13 | Disposition: A | Discharge status: Home / Self Care | Payer: Medicare HMO | Attending: Emergency Medicine | Admitting: Emergency Medicine

## 2019-03-13 ENCOUNTER — Encounter (HOSPITAL_COMMUNITY): Payer: Self-pay | Admitting: Emergency Medicine

## 2019-03-13 DIAGNOSIS — K118 Other diseases of salivary glands: Secondary | ICD-10-CM

## 2019-03-13 DIAGNOSIS — E0789 Other specified disorders of thyroid: Secondary | ICD-10-CM | POA: Diagnosis not present

## 2019-03-13 DIAGNOSIS — F17210 Nicotine dependence, cigarettes, uncomplicated: Secondary | ICD-10-CM | POA: Insufficient documentation

## 2019-03-13 DIAGNOSIS — Z7901 Long term (current) use of anticoagulants: Secondary | ICD-10-CM | POA: Insufficient documentation

## 2019-03-13 DIAGNOSIS — Z79899 Other long term (current) drug therapy: Secondary | ICD-10-CM | POA: Insufficient documentation

## 2019-03-13 DIAGNOSIS — E042 Nontoxic multinodular goiter: Secondary | ICD-10-CM | POA: Insufficient documentation

## 2019-03-13 DIAGNOSIS — F458 Other somatoform disorders: Secondary | ICD-10-CM | POA: Diagnosis not present

## 2019-03-13 DIAGNOSIS — R0989 Other specified symptoms and signs involving the circulatory and respiratory systems: Secondary | ICD-10-CM

## 2019-03-13 DIAGNOSIS — R1310 Dysphagia, unspecified: Secondary | ICD-10-CM | POA: Diagnosis present

## 2019-03-13 DIAGNOSIS — E079 Disorder of thyroid, unspecified: Secondary | ICD-10-CM

## 2019-03-13 MED ORDER — LIDOCAINE VISCOUS HCL 2 % MT SOLN
15.0000 mL | Freq: Once | OROMUCOSAL | Status: AC
  Administered 2019-03-13: 12:00:00 15 mL via OROMUCOSAL
  Filled 2019-03-13: qty 15

## 2019-03-13 MED ORDER — MAGIC MOUTHWASH W/LIDOCAINE: 10.0000 mL | Freq: Four times a day (QID) | ORAL | 0 refills | Status: DC | PRN

## 2019-03-13 NOTE — Discharge Instructions (Addendum)
Your exam today is reassuring.  You will need close followup with Dr Benjamine Mola - please call on Monday as we discussed.  I have also sent him a message.  You may use the medication prescribed which may help with your discomfort.

## 2019-03-13 NOTE — ED Triage Notes (Signed)
Patient c/o mass in throat. Per patient started 2 weeks ago in which she seen DR Benjamine Mola for mass and had tests. Patient states she has not gotten results back. Patient reports swelling getting worse in her throat last night and difficulty swallowing. Patient able to handle oral secretions. Patient denies any fevers, reports coughing when trying to swallow.

## 2019-03-13 NOTE — ED Notes (Signed)
Pt only able to take half of lidocaine

## 2019-03-13 NOTE — ED Provider Notes (Signed)
[facility] EMERGENCY DEPARTMENT Provider Note   CSN: [id number] Arrival date & time: 03/13/19  1135    History   Chief Complaint Chief Complaint  Patient presents with  . Mass    HPI Rhonda Hudson is a 63 y.o. female with past medical history as outlined below, most significant for recent diagnosis of right parotid mass and a right thyroid nodule which is currently being evaluated by Dr. Lorelee Cover, anticipating biopsy of the sites soon, but complicated by her history of DVT which is treated with Xarelto and also due to factor II deficiency.  She reports difficulty swallowing, describing painful swallowing but denies shortness of breath, nausea, vomiting.  She has maintained a soft to liquid diet over the weekend, denies regurgitation.  She has had no fevers or chills, denies shortness of breath, chest pain.  She has had no treatments prior to arrival, stating she does not like to take medications.       The history is provided by the patient.    Past Medical History:  Diagnosis Date  . Cataracts, bilateral   . Chronic anticoagulation 07/15/2017  . Diverticulitis   . DVT (deep venous thrombosis) ([place])   . DVT of lower extremity (deep venous thrombosis) ([place]) 01/04/2016  . Factor II deficiency ([place])   . Hiatal hernia with gastroesophageal reflux    disease  . History of prediabetes   . Insomnia   . Irritable bowel syndrome   . 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 Panic attacks    Hx of  . Pre-diabetes   . Thickened endometrium   . Thyroid nodule 03/03/2019    Patient Active Problem List   Diagnosis Date Noted  . 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 Thyroid nodule 03/03/2019  . Elevated hemoglobin A1c 01/18/2019  . Chronic anticoagulation 07/15/2017  . History of prediabetes   . Factor II deficiency ([place])   . Smoker 01/14/2017  . Personal history of noncompliance with medical treatment, presenting hazards to health 01/14/2017  . Acute loss of vision, left  10/14/2016  . History of hypothyroidism 01/04/2016  . DVT of lower extremity (deep venous thrombosis) ([place]) 01/04/2016    Past Surgical History:  Procedure Laterality Date  . CHOLECYSTECTOMY    . DILATION AND CURETTAGE OF UTERUS N/A 06/16/2018   Procedure: DILATATION AND CURETTAGE;  Surgeon: Emily Filbert, MD;  Location: [facility];  Service: Gynecology;  Laterality: N/A;  . TOE SURGERY    . TUBAL LIGATION    . US ECHOCARDIOGRAPHY  01-31-2009   EF 60%     OB History    Gravida  3   Para      Term      Preterm      AB      Living  3     SAB      TAB      Ectopic      Multiple      Live Births  3            Home Medications    Prior to Admission medications   Medication Sig Start Date End Date Taking? Authorizing Provider  FLUoxetine (PROZAC) 10 MG capsule Take 1 capsule by mouth daily.    [provider]  nicotine (NICODERM CQ) 14 mg/24hr patch Place 1 patch onto the skin daily.    [provider]  XARELTO 20 MG TABS tablet TAKE 1 TABLET BY MOUTH DAILY WITH SUPPER 03/11/19  Girtha Rm, NP-C    Family History Family History  Problem Relation Age of Onset  . Leukemia Father   . Leukemia Sister   . Leukemia Brother   . Multiple sclerosis Neg Hx   . Autoimmune disease Neg Hx     Social History Social History   Tobacco Use  . Smoking status: Current Every Day Smoker    Packs/day: 0.50    Types: Cigarettes  . Smokeless tobacco: Never Used  Substance Use Topics  . Alcohol use: No    Alcohol/week: 0.0 standard drinks  . Drug use: No     Allergies   Patient has no known allergies.   Review of Systems Review of Systems  Constitutional: Negative for chills and fever.  HENT: Positive for facial swelling and sore throat. Negative for congestion.   Eyes: Negative.   Respiratory: Negative for chest tightness, shortness of breath, wheezing and stridor.   Cardiovascular: Negative for chest pain.   Gastrointestinal: Negative for abdominal pain, nausea and vomiting.  Genitourinary: Negative.   Musculoskeletal: Negative for arthralgias, joint swelling and neck pain.  Skin: Negative.  Negative for rash and wound.  Neurological: Negative for dizziness, weakness, light-headedness, numbness and headaches.  Psychiatric/Behavioral: Negative.      Physical Exam Updated Vital Signs BP (!) 144/78 (BP Location: Right Arm)   Pulse 88   Temp 97.6 F (36.4 C) (Temporal)   Resp 20   Ht 5\' 6"  (1.676 m)   Wt 101.6 kg   SpO2 99%   BMI 36.15 kg/m   Physical Exam Vitals signs and nursing note reviewed.  Constitutional:      General: She is not in acute distress.    Appearance: She is well-developed.  HENT:     Head: Normocephalic and atraumatic.     Salivary Glands: Right salivary gland is diffusely enlarged. Right salivary gland is not tender.     Comments: Phonation normal Eyes:     Conjunctiva/sclera: Conjunctivae normal.  Neck:     Musculoskeletal: Normal range of motion.     Thyroid: Thyroid mass and thyroid tenderness present.  Cardiovascular:     Rate and Rhythm: Normal rate and regular rhythm.     Heart sounds: Normal heart sounds. No murmur.  Pulmonary:     Effort: Pulmonary effort is normal. No respiratory distress.     Breath sounds: Normal breath sounds. No stridor. No wheezing or rhonchi.  Musculoskeletal: Normal range of motion.  Skin:    General: Skin is warm and dry.  Neurological:     Mental Status: She is alert.      ED Treatments / Results  Labs (all labs ordered are listed, but only abnormal results are displayed) Labs Reviewed - No data to display  EKG None  Radiology No results found.  Procedures Procedures (including critical care time)  Medications Ordered in ED Medications  lidocaine (XYLOCAINE) 2 % viscous mouth solution 15 mL (15 mLs Mouth/Throat Given 03/13/19 1221)     Initial Impression / Assessment and Plan / ED Course  I have  reviewed the triage vital signs and the nursing notes.  Pertinent labs & imaging results that were available during my care of the patient were reviewed by me and considered in my medical decision making (see chart for details).       IMPRESSION: 1. Solid mass within the right parotid gland measuring 22 x 19 mm. The appearance is nonspecific due to overlap in the imaging characteristics of benign and malignant  parotid masses. Histologic sampling should be considered. 2. Hypodense right thyroid nodule, measuring up to 35 mm. Dedicated thyroid ultrasound recommended on a nonemergent basis. 3. No left neck mass. CT imaging from 6/16 reviewed - large right parotid and right thyroid mass. No oropharyngeal involvement.  She has no stridor, no wheezing, speaking in full sentences with normal phonation.  No indication for repeat imaging today, but would benefit from close ENT f/u.  She was given dose of viscous lidocaine here, prescription for magic mouthwash for home sx relief, plan close f/u with Dr. Benjamine Mola.    Pt discussed with Dr. Sabra Heck prior to dc home.   Final Clinical Impressions(s) / ED Diagnoses   Final diagnoses:  Thyroid mass  Mass of right parotid gland  Globus sensation    ED Discharge Orders    None       Landis Martins 03/13/19 1229    Noemi Chapel, MD 03/16/19 [phone]

## 2019-03-16 ENCOUNTER — Other Ambulatory Visit (HOSPITAL_COMMUNITY): Payer: Self-pay | Admitting: Otolaryngology

## 2019-03-16 ENCOUNTER — Telehealth: Payer: Self-pay | Admitting: Internal Medicine

## 2019-03-16 DIAGNOSIS — K118 Other diseases of salivary glands: Secondary | ICD-10-CM

## 2019-03-16 DIAGNOSIS — E041 Nontoxic single thyroid nodule: Secondary | ICD-10-CM

## 2019-03-16 NOTE — Telephone Encounter (Signed)
FYI  Pt called and states that her throat has gotten bigger than when she was seen with Dr. Benjamine Mola, Dr. Benjamine Mola supposely sent order over last week for biposies. Pt had not heard anything, I called Dr. Benjamine Mola office and they hadn't heard anything about appt. So Dr. Benjamine Mola office told me to call radiology. I spoke to [place] with biopsy department [phone]), they received order yesterday 03/15/2019. This was sent today for review by radiologist cause it is 2 different orders (thryoid and parathyroid), radiologist has 24-48 hours to review and then can be scheduled. If a certain biposy has to be done, then pt will have to come to [place] to have it done as [facility] can't do parathryoid. Pt was notified that she should hear back in the next couple days about scheduling.

## 2019-03-17 ENCOUNTER — Other Ambulatory Visit: Payer: Self-pay

## 2019-03-17 ENCOUNTER — Other Ambulatory Visit: Payer: Self-pay | Admitting: Family Medicine

## 2019-03-17 ENCOUNTER — Emergency Department (HOSPITAL_COMMUNITY): Payer: Medicare HMO

## 2019-03-17 ENCOUNTER — Emergency Department (HOSPITAL_COMMUNITY)
Admission: EM | Admit: 2019-03-17 | Discharge: 2019-03-17 | Disposition: A | Discharge status: Home / Self Care | Payer: Medicare HMO | Attending: Emergency Medicine | Admitting: Emergency Medicine

## 2019-03-17 ENCOUNTER — Telehealth: Payer: Self-pay | Admitting: Family Medicine

## 2019-03-17 ENCOUNTER — Encounter (HOSPITAL_COMMUNITY): Payer: Self-pay

## 2019-03-17 ENCOUNTER — Other Ambulatory Visit (HOSPITAL_COMMUNITY): Payer: Self-pay | Admitting: Otolaryngology

## 2019-03-17 ENCOUNTER — Telehealth: Payer: Self-pay | Admitting: Internal Medicine

## 2019-03-17 DIAGNOSIS — R1310 Dysphagia, unspecified: Secondary | ICD-10-CM | POA: Diagnosis present

## 2019-03-17 DIAGNOSIS — E041 Nontoxic single thyroid nodule: Secondary | ICD-10-CM | POA: Diagnosis not present

## 2019-03-17 DIAGNOSIS — R221 Localized swelling, mass and lump, neck: Secondary | ICD-10-CM | POA: Diagnosis not present

## 2019-03-17 DIAGNOSIS — F17210 Nicotine dependence, cigarettes, uncomplicated: Secondary | ICD-10-CM | POA: Insufficient documentation

## 2019-03-17 DIAGNOSIS — Z79899 Other long term (current) drug therapy: Secondary | ICD-10-CM | POA: Insufficient documentation

## 2019-03-17 DIAGNOSIS — D481 Neoplasm of uncertain behavior of connective and other soft tissue: Secondary | ICD-10-CM | POA: Diagnosis not present

## 2019-03-17 DIAGNOSIS — M542 Cervicalgia: Secondary | ICD-10-CM | POA: Diagnosis not present

## 2019-03-17 DIAGNOSIS — E119 Type 2 diabetes mellitus without complications: Secondary | ICD-10-CM | POA: Insufficient documentation

## 2019-03-17 DIAGNOSIS — K118 Other diseases of salivary glands: Secondary | ICD-10-CM | POA: Diagnosis not present

## 2019-03-17 DIAGNOSIS — E060 Acute thyroiditis: Secondary | ICD-10-CM

## 2019-03-17 DIAGNOSIS — Z7901 Long term (current) use of anticoagulants: Secondary | ICD-10-CM | POA: Insufficient documentation

## 2019-03-17 LAB — BASIC METABOLIC PANEL
Anion gap: 11 (ref 5–15)
BUN: 10 mg/dL (ref 8–23)
CO2: 24 mmol/L (ref 22–32)
Calcium: 9 mg/dL (ref 8.9–10.3)
Chloride: 97 mmol/L — ABNORMAL LOW (ref 98–111)
Creatinine, Ser: 0.86 mg/dL (ref 0.44–1.00)
GFR calc Af Amer: >60 mL/min (ref >60)
GFR calc non Af Amer: >60 mL/min (ref >60)
Glucose, Bld: 157 mg/dL — ABNORMAL HIGH (ref 70–99)
Potassium: 4.1 mmol/L (ref 3.5–5.1)
Sodium: 132 mmol/L — ABNORMAL LOW (ref 135–145)

## 2019-03-17 LAB — CBC WITH DIFFERENTIAL/PLATELET
Abs Immature Granulocytes: 0.07 10*3/uL (ref 0.00–0.07)
Basophils Absolute: 0 10*3/uL (ref 0.0–0.1)
Basophils Relative: 0 %
Eosinophils Absolute: 0 10*3/uL (ref 0.0–0.5)
Eosinophils Relative: 0 %
HCT: 41.4 % (ref 36.0–46.0)
Hemoglobin: 13.7 g/dL (ref 12.0–15.0)
Immature Granulocytes: 1 %
Lymphocytes Relative: 12 %
Lymphs Abs: 1.8 10*3/uL (ref 0.7–4.0)
MCH: 28.7 pg (ref 26.0–34.0)
MCHC: 33.1 g/dL (ref 30.0–36.0)
MCV: 86.8 fL (ref 80.0–100.0)
Monocytes Absolute: 0.9 10*3/uL (ref 0.1–1.0)
Monocytes Relative: 6 %
Neutro Abs: 11.7 10*3/uL — ABNORMAL HIGH (ref 1.7–7.7)
Neutrophils Relative %: 81 %
Platelets: 277 10*3/uL (ref 150–400)
RBC: 4.77 MIL/uL (ref 3.87–5.11)
RDW: 13 % (ref 11.5–15.5)
WBC: 14.4 10*3/uL — ABNORMAL HIGH (ref 4.0–10.5)
nRBC: 0 % (ref 0.0–0.2)

## 2019-03-17 MED ORDER — AMOXICILLIN-POT CLAVULANATE 875-125 MG PO TABS: 1.0000 | ORAL_TABLET | Freq: Two times a day (BID) | ORAL | 0 refills | Status: DC

## 2019-03-17 MED ORDER — MORPHINE SULFATE (PF) 2 MG/ML IV SOLN: 2.0000 mg | INTRAVENOUS | Status: DC | PRN

## 2019-03-17 MED ORDER — IOHEXOL 300 MG/ML  SOLN
75.0000 mL | Freq: Once | INTRAMUSCULAR | Status: AC | PRN
  Administered 2019-03-17: 75 mL via INTRAVENOUS

## 2019-03-17 NOTE — Telephone Encounter (Signed)
Dr. Benjamine Mola office did not need to see pt and would just seen the pt at her follow-up on 7/23 after pt is done with biopsies

## 2019-03-17 NOTE — ED Provider Notes (Signed)
[facility] EMERGENCY DEPARTMENT Provider Note   CSN: [id number] Arrival date & time: 03/17/19  9379     History   Chief Complaint Chief Complaint  Patient presents with  . Dysphagia    HPI Rhonda Hudson is a 63 y.o. female.     HPI  Pt was seen at 0945. Per pt, c/o gradual onset and worsening of persistent right "neck swelling" for the past 2 weeks, worse over the past 1 week. Pt states she was evaluated by her PMD and in the ED last week for her symptoms, dx parotid mass and thyroid nodule, and referred to ENT MD. Pt states she was "supposed to be getting scheduled for a biopsy" but she feels this process is taking too long.  Denies fevers, no rash, no CP/SOB, no abd pain, no N/V/D, no hoarse voice, no drooling/stridor.    ENT; Dr. Benjamine Mola Past Medical History:  Diagnosis Date  . Cataracts, bilateral   . Chronic anticoagulation 07/15/2017  . Diverticulitis   . DVT (deep venous thrombosis) ([place])   . DVT of lower extremity (deep venous thrombosis) ([place]) 01/04/2016  . 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 Hiatal hernia with gastroesophageal reflux    disease  . History of prediabetes   . Insomnia   . Irritable bowel syndrome   . 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 Panic attacks    Hx of  . Pre-diabetes   . Thickened endometrium   . Thyroid nodule 03/03/2019    Patient Active Problem List   Diagnosis Date Noted  . 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 Thyroid nodule 03/03/2019  . Elevated hemoglobin A1c 01/18/2019  . Chronic anticoagulation 07/15/2017  . History of prediabetes   . 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 Smoker 01/14/2017  . Personal history of noncompliance with medical treatment, presenting hazards to health 01/14/2017  . Acute loss of vision, left 10/14/2016  . History of hypothyroidism 01/04/2016  . DVT of lower extremity (deep venous thrombosis) ([place]) 01/04/2016    Past Surgical History:  Procedure Laterality Date  . CHOLECYSTECTOMY    .  DILATION AND CURETTAGE OF UTERUS N/A 06/16/2018   Procedure: DILATATION AND CURETTAGE;  Surgeon: Emily Filbert, MD;  Location: [place];  Service: Gynecology;  Laterality: N/A;  . TOE SURGERY    . TUBAL LIGATION    . US ECHOCARDIOGRAPHY  01-31-2009   EF 60%     OB History    Gravida  3   Para      Term      Preterm      AB      Living  3     SAB      TAB      Ectopic      Multiple      Live Births  3            Home Medications    Prior to Admission medications   Medication Sig Start Date End Date Taking? Authorizing Provider  FLUoxetine (PROZAC) 10 MG capsule Take 1 capsule by mouth daily.    [provider]  magic mouthwash w/lidocaine SOLN Take 10 mLs by mouth 4 (four) times daily as needed (throat pain). Note to pharmacy - equal parts diphendydramine, aluminum hydroxide and lidocaine HCL 03/13/19   Idol, Almyra Free, PA-C  nicotine (NICODERM CQ) 14 mg/24hr patch Place 1 patch onto the skin daily.    [provider]  Alveda Reasons  20 MG TABS tablet TAKE 1 TABLET BY MOUTH DAILY WITH SUPPER 03/11/19   Girtha Rm, NP-C    Family History Family History  Problem Relation Age of Onset  . Leukemia Father   . Leukemia Sister   . Leukemia Brother   . Multiple sclerosis Neg Hx   . Autoimmune disease Neg Hx     Social History Social History   Tobacco Use  . Smoking status: Current Every Day Smoker    Packs/day: 0.50    Types: Cigarettes  . Smokeless tobacco: Never Used  Substance Use Topics  . Alcohol use: No    Alcohol/week: 0.0 standard drinks  . Drug use: No     Allergies   Patient has no known allergies.   Review of Systems Review of Systems ROS: Statement: All systems negative except as marked or noted in the HPI; Constitutional: Negative for fever and chills. ; ; Eyes: Negative for eye pain, redness and discharge. ; ; ENMT: Negative for ear pain, hoarseness, nasal congestion, sinus pressure and +"neck swelling."; ;  Cardiovascular: Negative for chest pain, palpitations, diaphoresis, dyspnea and peripheral edema. ; ; Respiratory: Negative for cough, wheezing and stridor. ; ; Gastrointestinal: Negative for nausea, vomiting, diarrhea, abdominal pain, blood in stool, hematemesis, jaundice and rectal bleeding. . ; ; Genitourinary: Negative for dysuria, flank pain and hematuria. ; ; Musculoskeletal: Negative for back pain and neck pain. Negative for swelling and trauma.; ; Skin: Negative for pruritus, rash, abrasions, blisters, bruising and skin lesion.; ; Neuro: Negative for headache, lightheadedness and neck stiffness. Negative for weakness, altered level of consciousness, altered mental status, extremity weakness, paresthesias, involuntary movement, seizure and syncope.       Physical Exam Updated Vital Signs BP 121/85 (BP Location: Left Arm)   Pulse 80   Temp 98.1 F (36.7 C) (Oral)   Resp 16   SpO2 99%   Physical Exam 0950: Physical examination:  Nursing notes reviewed; Vital signs and O2 SAT reviewed;  Constitutional: Well developed, Well nourished, Well hydrated, In no acute distress; Head:  Normocephalic, atraumatic; Eyes: EOMI, PERRL, No scleral icterus; ENMT: TM's clear bilat. +edemetous nasal turbinates bilat with clear rhinorrhea.  +right parotid area enlargement.  No intra-oral lesions. No tonsillar exudates. No hoarse voice, no drooling, no stridor. Posterior pharynx without edema, Mucous membranes moist; Neck: Supple, Full range of motion;;  Cardiovascular: Regular rate and rhythm, No gallop; Respiratory: Breath sounds clear & equal bilaterally, No wheezes.  Speaking full sentences with ease, Normal respiratory effort/excursion; Chest: Nontender, Movement normal; Abdomen: Soft, Nontender, Nondistended, Normal bowel sounds; Genitourinary: No CVA tenderness; Extremities: Peripheral pulses normal, No tenderness, No edema, No calf edema or asymmetry.; Neuro: AA&Ox3, Major CN grossly intact.  Speech clear.  No gross focal motor or sensory deficits in extremities. Climbs on and off stretcher easily by herself. Gait steady with cane..; Skin: Color normal, Warm, Dry.   ED Treatments / Results  Labs (all labs ordered are listed, but only abnormal results are displayed)   EKG None  Radiology   Procedures Procedures (including critical care time)  Medications Ordered in ED Medications  morphine 2 MG/ML injection 2 mg (has no administration in time range)     Initial Impression / Assessment and Plan / ED Course  I have reviewed the triage vital signs and the nursing notes.  Pertinent labs & imaging results that were available during my care of the patient were reviewed by me and considered in my medical decision making (see chart  for details).     MDM Reviewed: previous chart, nursing note and vitals Reviewed previous: labs and CT scan Interpretation: labs and CT scan    Results for orders placed or performed during the hospital encounter of 93/71/69  Basic metabolic panel  Result Value Ref Range   Sodium 132 (L) 135 - 145 mmol/L   Potassium 4.1 3.5 - 5.1 mmol/L   Chloride 97 (L) 98 - 111 mmol/L   CO2 24 22 - 32 mmol/L   Glucose, Bld 157 (H) 70 - 99 mg/dL   BUN 10 8 - 23 mg/dL   Creatinine, Ser 0.86 0.44 - 1.00 mg/dL   Calcium 9.0 8.9 - 10.3 mg/dL   GFR calc non Af Amer >60 >60 mL/min   GFR calc Af Amer >60 >60 mL/min   Anion gap 11 5 - 15  CBC with Differential  Result Value Ref Range   WBC 14.4 (H) 4.0 - 10.5 K/uL   RBC 4.77 3.87 - 5.11 MIL/uL   Hemoglobin 13.7 12.0 - 15.0 g/dL   HCT 41.4 36.0 - 46.0 %   MCV 86.8 80.0 - 100.0 fL   MCH 28.7 26.0 - 34.0 pg   MCHC 33.1 30.0 - 36.0 g/dL   RDW 13.0 11.5 - 15.5 %   Platelets 277 150 - 400 K/uL   nRBC 0.0 0.0 - 0.2 %   Neutrophils Relative % 81 %   Neutro Abs 11.7 (H) 1.7 - 7.7 K/uL   Lymphocytes Relative 12 %   Lymphs Abs 1.8 0.7 - 4.0 K/uL   Monocytes Relative 6 %   Monocytes Absolute 0.9 0.1 - 1.0 K/uL    Eosinophils Relative 0 %   Eosinophils Absolute 0.0 0.0 - 0.5 K/uL   Basophils Relative 0 %   Basophils Absolute 0.0 0.0 - 0.1 K/uL   Immature Granulocytes 1 %   Abs Immature Granulocytes 0.07 0.00 - 0.07 K/uL   Dg Chest 2 View Result Date: 03/17/2019 CLINICAL DATA:  Neck pain and swelling EXAM: CHEST - 2 VIEW COMPARISON:  December 20, 2016 FINDINGS: There is no edema or consolidation. Heart size and pulmonary vascular normal. No adenopathy. There is degenerative change in the thoracic spine. There is mild leftward deviation of the upper thoracic trachea. IMPRESSION: Mild leftward deviation of the upper thoracic trachea. Suspect thyroid enlargement as most likely etiology for this finding. Lungs clear. No evident adenopathy. Electronically Signed   By: Lowella Grip III M.D.   On: 03/17/2019 11:07   Ct Soft Tissue Neck W Contrast Result Date: 03/17/2019 CLINICAL DATA:  Recent diagnosis neck tumor. Difficulty swallowing. Unable to open. Pain in right side EXAM: CT NECK WITH CONTRAST TECHNIQUE: Multidetector CT imaging of the neck was performed using the standard protocol following the bolus administration of intravenous contrast. CONTRAST:  19mL OMNIPAQUE IOHEXOL 300 MG/ML  SOLN COMPARISON:  03/02/2019 FINDINGS: Pharynx and larynx: No evidence of a mucosal mass. Patient has a 9 mm cyst associated with the upper surface of the epiglottis in the vallecular. Salivary glands: Submandibular glands are normal. Left parotid gland is normal. 2.3 cm mass as seen previously in the right parotid gland at the junction of the deep and superficial lobes. As noted previously, this could be a benign or malignant mass and further evaluation was and is recommended. Thyroid: Cystic lesion of the right thyroid gland is slightly larger, measuring up to 5 cm in diameter. This appears unilocular as previous. However, there is clearly more soft tissue thickening surrounding this  lesion suggesting inflammatory change. Therefore,  this could be infected. Lymph nodes: No enlarged or low-density nodes on either side of the neck. Vascular: Normal Limited intracranial: Normal Visualized orbits: Minimally included.  Normal. Mastoids and visualized paranasal sinuses: Clear Skeleton: Ordinary cervical spondylosis. Upper chest: Mild scarring and emphysematous changes. Other: None IMPRESSION: Development soft tissue thickening surrounding the 5 cm thyroid cystic lesion on the right since the study of 2 weeks ago. This rapid change suggests infectious inflammation. No evidence of regional adenopathy. No change in a 2.3 cm right parotid mass. As noted previously, this could be benign or malignant and further evaluation is recommended electively. Electronically Signed   By: Nelson Chimes M.D.   On: 03/17/2019 11:39    DIEGO DELANCEY was evaluated in Emergency Department on 03/17/2019 for the symptoms described in the history of present illness. She was evaluated in the context of the global COVID-19 pandemic, which necessitated consideration that the patient might be at risk for infection with the SARS-CoV-2 virus that causes COVID-19. Institutional protocols and algorithms that pertain to the evaluation of patients at risk for COVID-19 are in a state of rapid change based on information released by regulatory bodies including the CDC and federal and state organizations. These policies and algorithms were followed during the patient's care in the ED.    [phone]:  Pt's expectations were that she was going to be able to obtain FNA biopsy +/- surgical intervention for her parotid and thyroid masses today by coming to the ED. Long d/w pt regarding ED's scope within healthcare continuum, and I offered to re-check her labs, CT scan today and, if changed, consult with ENT MD on call. Pt initially stated "oh" and "that's not what I thought" and "if that's all that can be done then maybe I'll just go then." Pt eventually stated she would stay for labs, CT  scan. Workup ordered.   1200:  CT as above. WBC count mildly elevated, no fevers. No drooling/hoarse voice/stridor. No clear indication for admission at this time.  T/C returned from ENT Dr. Benjamine Mola, case discussed, including:  HPI, pertinent PM/SHx, VS/PE, dx testing, ED course and treatment:  Agrees with starting abx (ie: Augmentin), [facility] to schedule FNA, otherwise have pt f/u in office. Dx and testing, as well as incidental finding(s) and re-set expectations regarding timeline/further studies needed, d/w pt.  Questions answered.  Verb understanding, agreeable to d/c home with outpt f/u.    Final Clinical Impressions(s) / ED Diagnoses   Final diagnoses:  None    ED Discharge Orders    None       Francine Graven, DO 03/20/19 1497

## 2019-03-17 NOTE — ED Triage Notes (Signed)
Pt was diagnosed with a mass in her throat 2 weeks ago.  Was referred to an ENT doc to follow up with for biopsy and further test. Pt is now having trouble swallowing, is unable to open mouth, pain in her right head and her right eardrum

## 2019-03-17 NOTE — Telephone Encounter (Signed)
Dr. Benjamine Mola office needs an order for her to be scheduled. She needs an order for her to stop xarelto for a day for the biopsies to be done.   [phone]

## 2019-03-17 NOTE — Telephone Encounter (Signed)
Called patient left a detailed voicemail to let her know that I reviewed the emergency department summary, CT result and recommended plan of care.  She was prescribed Augmentin due to presumed infectious etiology for enlarged thyroid.  Dr. Benjamine Mola, ENT, has her scheduled for a thyroid ultrasound on Monday, July 6 and thyroid and parotid gland biopsies on July 9.  Left message that if her symptoms worsen in the spite of being on the antibiotic that she would need to return to the ED.  I will also order referral to endocrinology for her.  Plan to be in touch with her tomorrow since I am out of the office Friday. Gabriel Cirri, please call her Thursday morning to see how she is doing.

## 2019-03-17 NOTE — Telephone Encounter (Signed)
Patient has appointments for biopsy of thyroid on 07/06 and 07/09. Please give Dr. Benjamine Mola a call and see if they want to see her after today's ED visit.

## 2019-03-17 NOTE — Discharge Instructions (Addendum)
Take the prescription as directed.  Call your regular medical doctor today to schedule a follow up appointment within the week.  Call your ENT doctor today to schedule a follow up appointment within the next week. Return to the Emergency Department immediately sooner if worsening.

## 2019-03-17 NOTE — Telephone Encounter (Signed)
Pt called & states she just left [facility] and was told to follow up within 3 days, informed per [name] to follow up with ENT.   Pt called ENT and soonest they could see her is 04/08/19, she is concerned that is too long.  Can you see if you can get her in any sooner for follow up?

## 2019-03-17 NOTE — Telephone Encounter (Signed)
I have faxed over order to Dr. Benjamine Mola.   Just got a voicemail left on my phone that pt is headed to ER cause she feels like her head and ear is going to explode due to the pain and the mass is getting bigger

## 2019-03-18 NOTE — Telephone Encounter (Signed)
Spoke to patient before I left yesterday and she was in some pain and I advised her that she did not need to follow-up with [place] or Dr. Benjamine Mola because her biopises were scheduled Monday of next week

## 2019-03-18 NOTE — Telephone Encounter (Signed)
Pt is still in pain from phone call yesterday not sleeping much but doing ok for now

## 2019-03-22 ENCOUNTER — Other Ambulatory Visit: Payer: Self-pay

## 2019-03-22 ENCOUNTER — Ambulatory Visit (HOSPITAL_COMMUNITY)
Admission: RE | Admit: 2019-03-22 | Discharge: 2019-03-22 | Disposition: A | Discharge status: Home / Self Care | Payer: Medicare HMO | Source: Ambulatory Visit | Attending: Otolaryngology | Admitting: Otolaryngology

## 2019-03-22 ENCOUNTER — Telehealth: Payer: Self-pay | Admitting: Internal Medicine

## 2019-03-22 DIAGNOSIS — E041 Nontoxic single thyroid nodule: Secondary | ICD-10-CM | POA: Insufficient documentation

## 2019-03-22 DIAGNOSIS — E042 Nontoxic multinodular goiter: Secondary | ICD-10-CM | POA: Diagnosis not present

## 2019-03-22 NOTE — Telephone Encounter (Signed)
Pt called and states she is having extreme ear pain and headache. She is not sleeping at night and she just can't take the pain anymore. She has a biopsy today on her thyroid. She states she can't lay down on the side of her ear pain, she hasn't been able to wash her hair in 10 days or brush her hair because the pain is so severe. She feels like she is dying and doesn't know what to do. I advised her that she could call Dr. Benjamine Mola and see if they could see her for the pain in ear. She is on antibiotic for the infection she was told she had but it has not helped the ear pain or headache. Please advise

## 2019-03-22 NOTE — Telephone Encounter (Signed)
Pt was notified to contact Dr. Benjamine Mola office. She is taking 1,000 mg of tylenol and no relief but advised to do 2,000

## 2019-03-22 NOTE — Telephone Encounter (Signed)
If she has tried Tylenol 1,000 mg twice daily and getting no relief then I would have her contact Dr. Deeann Saint office since he is managing this situation for her. In the past she has declined pain medications from me other than Tylenol due to her home situation. Best to be handled by ENT at this point.

## 2019-03-23 ENCOUNTER — Telehealth: Payer: Self-pay | Admitting: Internal Medicine

## 2019-03-23 NOTE — Telephone Encounter (Signed)
Please have her call her OB/GYN who did the endometrial biopsy with this information and see what they recommend.

## 2019-03-23 NOTE — Telephone Encounter (Signed)
Pt left a vm on my phone, stating that she is vaginal bleeding- alittle medium flow and wanted to know if this was normal for her with everything going on.

## 2019-03-23 NOTE — Telephone Encounter (Signed)
Pt was advised to call her obgyn but she doesn't feel like it. So she did not want to know the number to call

## 2019-03-24 ENCOUNTER — Other Ambulatory Visit (HOSPITAL_COMMUNITY): Payer: Self-pay | Admitting: Otolaryngology

## 2019-03-24 DIAGNOSIS — E041 Nontoxic single thyroid nodule: Secondary | ICD-10-CM

## 2019-03-25 ENCOUNTER — Ambulatory Visit (HOSPITAL_COMMUNITY): Admission: RE | Admit: 2019-03-25 | Payer: Medicare HMO | Source: Ambulatory Visit

## 2019-03-25 ENCOUNTER — Other Ambulatory Visit: Payer: Self-pay | Admitting: Radiology

## 2019-03-25 ENCOUNTER — Encounter (HOSPITAL_COMMUNITY): Payer: Self-pay

## 2019-03-29 ENCOUNTER — Encounter (HOSPITAL_COMMUNITY): Payer: Self-pay

## 2019-03-29 ENCOUNTER — Other Ambulatory Visit (HOSPITAL_COMMUNITY): Payer: Self-pay | Admitting: Otolaryngology

## 2019-03-29 ENCOUNTER — Ambulatory Visit (HOSPITAL_COMMUNITY)
Admission: RE | Admit: 2019-03-29 | Discharge: 2019-03-29 | Disposition: A | Discharge status: Home / Self Care | Payer: Medicare HMO | Source: Ambulatory Visit | Attending: Otolaryngology | Admitting: Otolaryngology

## 2019-03-29 ENCOUNTER — Other Ambulatory Visit: Payer: Self-pay

## 2019-03-29 DIAGNOSIS — K118 Other diseases of salivary glands: Secondary | ICD-10-CM | POA: Diagnosis not present

## 2019-03-29 DIAGNOSIS — E042 Nontoxic multinodular goiter: Secondary | ICD-10-CM | POA: Insufficient documentation

## 2019-03-29 DIAGNOSIS — Z806 Family history of leukemia: Secondary | ICD-10-CM | POA: Insufficient documentation

## 2019-03-29 DIAGNOSIS — Z7901 Long term (current) use of anticoagulants: Secondary | ICD-10-CM | POA: Insufficient documentation

## 2019-03-29 DIAGNOSIS — F17210 Nicotine dependence, cigarettes, uncomplicated: Secondary | ICD-10-CM | POA: Insufficient documentation

## 2019-03-29 DIAGNOSIS — M542 Cervicalgia: Secondary | ICD-10-CM | POA: Diagnosis not present

## 2019-03-29 DIAGNOSIS — E041 Nontoxic single thyroid nodule: Secondary | ICD-10-CM | POA: Diagnosis not present

## 2019-03-29 DIAGNOSIS — H9201 Otalgia, right ear: Secondary | ICD-10-CM | POA: Diagnosis not present

## 2019-03-29 DIAGNOSIS — Z86718 Personal history of other venous thrombosis and embolism: Secondary | ICD-10-CM | POA: Insufficient documentation

## 2019-03-29 DIAGNOSIS — R896 Abnormal cytological findings in specimens from other organs, systems and tissues: Secondary | ICD-10-CM | POA: Diagnosis not present

## 2019-03-29 DIAGNOSIS — D682 Hereditary deficiency of other clotting factors: Secondary | ICD-10-CM | POA: Diagnosis not present

## 2019-03-29 LAB — CBC
HCT: 43.4 % (ref 36.0–46.0)
Hemoglobin: 14.3 g/dL (ref 12.0–15.0)
MCH: 28.7 pg (ref 26.0–34.0)
MCHC: 32.9 g/dL (ref 30.0–36.0)
MCV: 87 fL (ref 80.0–100.0)
Platelets: 414 10*3/uL — ABNORMAL HIGH (ref 150–400)
RBC: 4.99 MIL/uL (ref 3.87–5.11)
RDW: 13.2 % (ref 11.5–15.5)
WBC: 11.4 10*3/uL — ABNORMAL HIGH (ref 4.0–10.5)
nRBC: 0 % (ref 0.0–0.2)

## 2019-03-29 LAB — PROTIME-INR
INR: 1.1 (ref 0.8–1.2)
Prothrombin Time: 14 seconds (ref 11.4–15.2)

## 2019-03-29 MED ORDER — MIDAZOLAM HCL 2 MG/2ML IJ SOLN
INTRAMUSCULAR | Status: AC
  Filled 2019-03-29: qty 2

## 2019-03-29 MED ORDER — FENTANYL CITRATE (PF) 100 MCG/2ML IJ SOLN
INTRAMUSCULAR | Status: AC | PRN
  Administered 2019-03-29 (×2): 50 ug via INTRAVENOUS

## 2019-03-29 MED ORDER — LIDOCAINE HCL (PF) 1 % IJ SOLN
INTRAMUSCULAR | Status: AC
  Filled 2019-03-29: qty 30

## 2019-03-29 MED ORDER — MIDAZOLAM HCL 2 MG/2ML IJ SOLN
INTRAMUSCULAR | Status: AC | PRN
  Administered 2019-03-29 (×3): 1 mg via INTRAVENOUS

## 2019-03-29 MED ORDER — SODIUM CHLORIDE 0.9 % IV SOLN
INTRAVENOUS | Status: AC | PRN
  Administered 2019-03-29: 10 mL/h via INTRAVENOUS

## 2019-03-29 MED ORDER — HYDROCODONE-ACETAMINOPHEN 5-325 MG PO TABS: 1.0000 | ORAL_TABLET | ORAL | Status: DC | PRN

## 2019-03-29 MED ORDER — SODIUM CHLORIDE 0.9 % IV SOLN: INTRAVENOUS | Status: DC

## 2019-03-29 MED ORDER — FENTANYL CITRATE (PF) 100 MCG/2ML IJ SOLN
INTRAMUSCULAR | Status: AC
  Filled 2019-03-29: qty 2

## 2019-03-29 NOTE — H&P (Addendum)
Chief Complaint: Patient was seen in consultation today for left thyroid nodule biopsy and right parotid mass biopsy at the request of Teoh,Su  Referring Physician(s): Teoh,Su  Supervising Physician: Arne Cleveland  Patient Status: [facility] - Out-pt  History of Present Illness: Rhonda Hudson is a 63 y.o. female   Scheduled today for right Parotid mass biopsy and left thyroid nodule biopsy Has had neck and rt ear pain for few weeks  Is taking an antibiotic and Tylenol--- some better  CT 03/17/19: IMPRESSION: Development soft tissue thickening surrounding the 5 cm thyroid cystic lesion on the right since the study of 2 weeks ago. This rapid change suggests infectious inflammation. No evidence of regional adenopathy. No change in a 2.3 cm right parotid mass. As noted previously, this could be benign or malignant and further evaluation is recommended electively.  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  Medications: Prior to Admission medications   Medication Sig Start Date End Date Taking? Authorizing Provider  acetaminophen (TYLENOL) 500 MG tablet Take 1,000 mg by mouth every 6 (six) hours as needed for headache.   Yes [provider]  XARELTO 20 MG TABS tablet TAKE 1 TABLET BY MOUTH DAILY WITH SUPPER Patient taking differently: Take 20 mg by mouth daily with supper.  03/11/19  Yes Henson, [name] L, NP-C  amoxicillin-clavulanate (AUGMENTIN) 875-125 MG tablet Take 1 tablet by mouth every 12 (twelve) hours. 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   Food insecurity    Worry: Not on file    Inability: Not on file   Transportation needs    Medical: Not on file    Non-medical: Not on file  Tobacco Use   Smoking status: Current  Every Day Smoker    Packs/day: 0.50    Types: Cigarettes   Smokeless tobacco: Never Used  Substance and Sexual Activity   Alcohol use: No    Alcohol/week: 0.0 standard drinks   Drug use: No   Sexual activity: Not on file  Lifestyle   Physical activity    Days per week: Not on file    Minutes per session: Not on file   Stress: Not on file  Relationships   Social connections    Talks on phone: Not on file    Gets together: Not on file    Attends religious service: Not on file    Active member of club or organization: Not on file    Attends meetings of clubs or  organizations: Not on file    Relationship status: Not on file  Other Topics Concern   Not on file  Social History Narrative   Lives with two sons   Caffeine use: coffee and coke daily   Right-handed     Review of Systems: A 12 point ROS discussed and pertinent positives are indicated in the HPI above.  All other systems are negative.  Review of Systems  Constitutional: Negative for activity change, fatigue and fever.  HENT: Positive for ear pain. Negative for hearing loss, sore throat, tinnitus and trouble swallowing.   Respiratory: Negative for cough and shortness of breath.   Cardiovascular: Negative for chest pain.  Gastrointestinal: Negative for abdominal pain.  Musculoskeletal: Negative for back pain.  Neurological: Negative for weakness.  Psychiatric/Behavioral: Negative for behavioral problems and confusion.    Vital Signs: BP 114/75 (BP Location: Right Arm)    Pulse 76    Resp 20    SpO2 95%   Physical Exam Vitals signs reviewed.  Cardiovascular:     Rate and Rhythm: Normal rate and regular rhythm.     Heart sounds: Normal heart sounds.  Pulmonary:     Breath sounds: Normal breath sounds.  Abdominal:     Tenderness: There is no abdominal tenderness.  Musculoskeletal: Normal range of motion.  Skin:    General: Skin is warm and dry.  Neurological:     Mental Status: She is alert and oriented to person, place, and time.  Psychiatric:        Mood and Affect: Mood normal.        Behavior: Behavior normal.        Thought Content: Thought content normal.        Judgment: Judgment normal.     Imaging: Dg Chest 2 View  Result Date: 03/17/2019 CLINICAL DATA:  Neck pain and swelling EXAM: CHEST - 2 VIEW COMPARISON:  December 20, 2016 FINDINGS: There is no edema or consolidation. Heart size and pulmonary vascular normal. No adenopathy. There is degenerative change in the thoracic spine. There is mild leftward deviation of the upper thoracic trachea. IMPRESSION: Mild  leftward deviation of the upper thoracic trachea. Suspect thyroid enlargement as most likely etiology for this finding. Lungs clear. No evident adenopathy. Electronically Signed   By: Lowella Grip III M.D.   On: 03/17/2019 11:07   Ct Soft Tissue Neck W Contrast  Result Date: 03/17/2019 CLINICAL DATA:  Recent diagnosis neck tumor. Difficulty swallowing. Unable to open. Pain in right side EXAM: CT NECK WITH CONTRAST TECHNIQUE: Multidetector CT imaging of the neck was performed using the standard protocol following the bolus administration of intravenous contrast. CONTRAST:  72mL OMNIPAQUE IOHEXOL 300 MG/ML  SOLN COMPARISON:  03/02/2019 FINDINGS:  Pharynx and larynx: No evidence of a mucosal mass. Patient has a 9 mm cyst associated with the upper surface of the epiglottis in the vallecular. Salivary glands: Submandibular glands are normal. Left parotid gland is normal. 2.3 cm mass as seen previously in the right parotid gland at the junction of the deep and superficial lobes. As noted previously, this could be a benign or malignant mass and further evaluation was and is recommended. Thyroid: Cystic lesion of the right thyroid gland is slightly larger, measuring up to 5 cm in diameter. This appears unilocular as previous. However, there is clearly more soft tissue thickening surrounding this lesion suggesting inflammatory change. Therefore, this could be infected. Lymph nodes: No enlarged or low-density nodes on either side of the neck. Vascular: Normal Limited intracranial: Normal Visualized orbits: Minimally included.  Normal. Mastoids and visualized paranasal sinuses: Clear Skeleton: Ordinary cervical spondylosis. Upper chest: Mild scarring and emphysematous changes. Other: None IMPRESSION: Development soft tissue thickening surrounding the 5 cm thyroid cystic lesion on the right since the study of 2 weeks ago. This rapid change suggests infectious inflammation. No evidence of regional adenopathy. No change in  a 2.3 cm right parotid mass. As noted previously, this could be benign or malignant and further evaluation is recommended electively. Electronically Signed   By: Nelson Chimes M.D.   On: 03/17/2019 11:39   Ct Soft Tissue Neck W Contrast  Result Date: 03/02/2019 CLINICAL DATA:  Dysphagia for 1 week EXAM: CT NECK WITH CONTRAST TECHNIQUE: Multidetector CT imaging of the neck was performed using the standard protocol following the bolus administration of intravenous contrast. CONTRAST:  6mL OMNIPAQUE IOHEXOL 300 MG/ML  SOLN COMPARISON:  None. FINDINGS: PHARYNX AND LARYNX: --Nasopharynx: Fossae of Rosenmuller are clear. Normal adenoid tonsils for age. --Oral cavity and oropharynx: The palatine and lingual tonsils are normal. The visible oral cavity and floor of mouth are normal. --Hypopharynx: Normal vallecula and pyriform sinuses. --Larynx: Normal epiglottis and pre-epiglottic space. Normal aryepiglottic and vocal folds. --Retropharyngeal space: No abscess, effusion or lymphadenopathy. SALIVARY GLANDS: --Parotid: There is a solid mass within the right parotid gland measuring 22 x 19 mm. Left parotid gland is normal. --Submandibular: Symmetric without inflammation. No sialolithiasis or ductal dilatation. --Sublingual: Normal. No ranula or other visible lesion of the base of tongue and floor of mouth. THYROID: There is a large hypodense nodule within the right thyroid lobe measuring 3.5 x 3.4 cm. LYMPH NODES: No enlarged or abnormal density lymph nodes. VASCULAR: Major cervical vessels are patent. LIMITED INTRACRANIAL: Normal. VISUALIZED ORBITS: Normal. MASTOIDS AND VISUALIZED PARANASAL SINUSES: No fluid levels or advanced mucosal thickening. No mastoid effusion. SKELETON: No bony spinal canal stenosis. No lytic or blastic lesions. UPPER CHEST: Clear. OTHER: None. IMPRESSION: 1. Solid mass within the right parotid gland measuring 22 x 19 mm. The appearance is nonspecific due to overlap in the imaging characteristics  of benign and malignant parotid masses. Histologic sampling should be considered. 2. Hypodense right thyroid nodule, measuring up to 35 mm. Dedicated thyroid ultrasound recommended on a nonemergent basis. 3. No left neck mass. Electronically Signed   By: Ulyses Jarred M.D.   On: 03/02/2019 15:39   US Thyroid  Addendum Date: 03/24/2019   ADDENDUM REPORT: 03/24/2019 12:58 ADDENDUM: Addendum created to address additional set of images of the recent ultrasound neck of parotid gland. Heterogeneously hypoechoic soft tissue nodule within the parotid gland, largest estimated diameter 2 cm. This corresponds to finding on prior CT. No internal color flow. Differential diagnosis for parotid  gland lesions includes both benign and malignant entities, with overlapping characteristics on imaging. If warranted, percutaneous sampling may be considered. Electronically Signed   By: Corrie Mckusick D.O.   On: 03/24/2019 12:58   Result Date: 03/24/2019 CLINICAL DATA:  63 year old female with right-sided head/neck pain EXAM: THYROID ULTRASOUND TECHNIQUE: Ultrasound examination of the thyroid gland and adjacent soft tissues was performed. COMPARISON:  CT 03/17/2019, 03/02/2019 FINDINGS: Parenchymal Echotexture: Mildly heterogenous Isthmus: 0.4 cm Right lobe: 6.5 cm x 5.1 cm x 5.2 cm Left lobe: 5.1 cm x 2.3 cm x 2.0 cm _________________________________________________________ Estimated total number of nodules >/= 1 cm: 3 Number of spongiform nodules >/=  2 cm not described below (TR1): 0 Number of mixed cystic and solid nodules >/= 1.5 cm not described below (Reeder): 0 _________________________________________________________ Right thyroid lesion is characterized based on recent CT imaging series, and not strictly TIRADS criteria. The lesion measures 5.5 cm x 4.0 cm x 4.7 cm within the mid right thyroid. Relatively heterogeneously hypoechoic internal tissue/fluid with a well-defined rim. No internal color flow. There is a focus centrally  of slightly increased echogenicity, representing blood products and/or debris. The size is relatively unchanged from the CT dated 03/17/2019. Nodule # 2: Location: Left; Superior Maximum size: 2.3 cm; Other 2 dimensions: 1.3 cm x 1.9 cm Composition: solid/almost completely solid (2) Echogenicity: hypoechoic (2) Shape: not taller-than-wide (0) Margins: smooth (0) Echogenic foci: punctate echogenic foci (3) ACR TI-RADS total points: 7. ACR TI-RADS risk category: TR5 (>/= 7 points). ACR TI-RADS recommendations: Nodule meets criteria for biopsy _________________________________________________________ Nodule # 3: Location: Left; Inferior Maximum size: 1.3 cm; Other 2 dimensions: 0.6 cm x 1.0 cm Composition: cannot determine (2) Echogenicity: hypoechoic (2) Shape: not taller-than-wide (0) Margins: ill-defined (0) Echogenic foci: none (0) ACR TI-RADS total points: 4. ACR TI-RADS risk category: TR4 (4-6 points). ACR TI-RADS recommendations: Nodule meets criteria for surveillance _________________________________________________________ Nodule # 4: Location: Left; Inferior Maximum size: 1.2 cm; Other 2 dimensions: 1.0 cm x 0.5 cm Composition: cystic/almost completely cystic (0) Echogenicity: anechoic (0) Shape: not taller-than-wide (0) Margins: smooth (0) Echogenic foci: none (0) ACR TI-RADS total points: 0. ACR TI-RADS risk category: TR1 (0-1 points). ACR TI-RADS recommendations: Cystic nodule does not meet criteria for surveillance or biopsy _________________________________________________________ No adenopathy IMPRESSION: Complex cystic lesion of the right thyroid measures 5.5 cm. Etiology may be inflammatory or infectious, less likely malignant. Percutaneous sampling may be considered. Left upper thyroid nodule (labeled 2) meets criteria for biopsy, as designated by the newly established ACR TI-RADS criteria, and referral for biopsy is recommended. Left lower thyroid nodule (labeled 3) meets criteria for surveillance,  as designated by the newly established ACR TI-RADS criteria. Surveillance ultrasound study recommended to be performed annually up to 5 years. Recommendations follow those established by the new ACR TI-RADS criteria (J Am Coll Radiol 9357;01:779-390). Electronically Signed: By: Corrie Mckusick D.O. On: 03/22/2019 14:39    Labs:  CBC: Recent Labs    05/14/18 0757 01/20/19 0900 03/17/19 1015 03/29/19 1140  WBC 10.1 10.9* 14.4* 11.4*  HGB 15.2* 14.9 13.7 14.3  HCT 44.1 44.4 41.4 43.4  PLT 225 224 277 414*    COAGS: Recent Labs    05/14/18 0757 03/29/19 1140  INR 1.36 1.1  APTT 49*  --     BMP: Recent Labs    05/06/18 1220 01/20/19 0900 03/17/19 1015  NA 136 136 132*  K 4.5 4.7 4.1  CL 101 99 97*  CO2 22 19* 24  GLUCOSE 110*  105* 157*  BUN 6* 10 10  CALCIUM 9.5 9.5 9.0  CREATININE 0.92 1.01* 0.86  GFRNONAA 66 59* >60  GFRAA 76 68 >60    LIVER FUNCTION TESTS: Recent Labs    05/06/18 1220 01/20/19 0900  BILITOT 0.3 0.4  AST 14 12  ALT 17 14  ALKPHOS 83 86  PROT 6.5 6.7  ALBUMIN 3.9 4.1    TUMOR MARKERS: No results for input(s): AFPTM, CEA, CA199, CHROMGRNA in the last 8760 hours.  Assessment and Plan:  Right neck/ear pain few weeks Right parotid mass And  Left thyroid cyst/nodule aspiration/bx Risks and benefits of parotid mass bx and thyroid nodule/cyst aspiration/bx was discussed with the patient and/or patient's family including, but not limited to bleeding, infection, damage to adjacent structures or low yield requiring additional tests.  All of the questions were answered and there is agreement to proceed. Consent signed and in chart.  Thank you for this interesting consult.  I greatly enjoyed meeting Rhonda Hudson and look forward to participating in their care.  A copy of this report was sent to the requesting provider on this date.  Electronically Signed: Lavonia Drafts, PA-C 03/29/2019, 1:13 PM   I spent a total of  30 Minutes   in  face to face in clinical consultation, greater than 50% of which was counseling/coordinating care for right parotid mass bx and left thyroid nodule asp/bx

## 2019-03-29 NOTE — Procedures (Signed)
  Procedure: 1. FNA R parotid mass  2. FNA L thyroid nodule 3. Aspiration R thyroid cyst 38ml EBL:   minimal Complications:  none immediate  See full dictation in [facility].  Dillard Cannon MD Main # [phone] Pager  [phone]

## 2019-03-29 NOTE — Discharge Instructions (Signed)
Moderate Conscious Sedation, Adult, Care After These instructions provide you with information about caring for yourself after your procedure. Your health care provider may also give you more specific instructions. Your treatment has been planned according to current medical practices, but problems sometimes occur. Call your health care provider if you have any problems or questions after your procedure. What can I expect after the procedure? After your procedure, it is common:  To feel sleepy for several hours.  To feel clumsy and have poor balance for several hours.  To have poor judgment for several hours.  To vomit if you eat too soon. Follow these instructions at home: For at least 24 hours after the procedure:   Do not: ? Participate in activities where you could fall or become injured. ? Drive. ? Use heavy machinery. ? Drink alcohol. ? Take sleeping pills or medicines that cause drowsiness. ? Make important decisions or sign legal documents. ? Take care of children on your own.  Rest. Eating and drinking  Follow the diet recommended by your health care provider.  If you vomit: ? Drink water, juice, or soup when you can drink without vomiting. ? Make sure you have little or no nausea before eating solid foods. General instructions  Have a responsible adult stay with you until you are awake and alert.  Take over-the-counter and prescription medicines only as told by your health care provider.  If you smoke, do not smoke without supervision.  Keep all follow-up visits as told by your health care provider. This is important. Contact a health care provider if:  You keep feeling nauseous or you keep vomiting.  You feel light-headed.  You develop a rash.  You have a fever. Get help right away if:  You have trouble breathing. This information is not intended to replace advice given to you by your health care provider. Make sure you discuss any questions you have  with your health care provider. Document Released: 06/23/2013 Document Revised: 08/15/2017 Document Reviewed: 12/23/2015 Elsevier Patient Education  2020 [place]. Thyroid Needle Biopsy, Care After This sheet gives you information about how to care for yourself after your procedure. Your health care provider may also give you more specific instructions. If you have problems or questions, contact your health care provider. What can I expect after the procedure? After the procedure, it is common to have:  Soreness and tenderness that lasts for a few days.  Bruising where the needle was inserted (puncture site). Follow these instructions at home:   Take over-the-counter and prescription medicines only as told by your health care provider.  To help ease discomfort, keep your head raised (elevated) when you are lying down. When you move from lying down to sitting up, use both hands to support the back of your head and neck.  Check your puncture site every day for signs of infection. Check for: ? Redness, swelling, or pain. ? Fluid or blood. ? Warmth. ? Pus or a bad smell.  Return to your normal activities as told by your health care provider. Ask your health care provider what activities are safe for you.  Keep all follow-up visits as told by your health care provider. This is important. Contact a health care provider if:  You have redness, swelling, or pain around your puncture site.  You have fluid or blood coming from your puncture site.  Your puncture site feels warm to the touch.  You have pus or a bad smell coming from your puncture  site.  You have a fever. Get help right away if:  You have severe bleeding from the puncture site.  You have difficulty swallowing.  You have swollen glands (lymph nodes) in your neck. Summary  It is common to have some bruising and soreness where the needle was inserted in your lower front neck area (puncture site).  Check your  puncture site every day for signs of infection, such as redness, swelling, or pain.  Get help right away if you have severe bleeding from your puncture site. This information is not intended to replace advice given to you by your health care provider. Make sure you discuss any questions you have with your health care provider. Document Released: 03/30/2014 Document Revised: 08/15/2017 Document Reviewed: 06/16/2017 Elsevier Patient Education  2020 [employer].

## 2019-04-02 ENCOUNTER — Other Ambulatory Visit: Payer: Self-pay

## 2019-04-06 ENCOUNTER — Telehealth: Payer: Self-pay | Admitting: Internal Medicine

## 2019-04-06 ENCOUNTER — Ambulatory Visit (INDEPENDENT_AMBULATORY_CARE_PROVIDER_SITE_OTHER): Payer: Medicare HMO | Admitting: Endocrinology

## 2019-04-06 ENCOUNTER — Other Ambulatory Visit: Payer: Self-pay

## 2019-04-06 ENCOUNTER — Encounter: Payer: Self-pay | Admitting: Endocrinology

## 2019-04-06 VITALS — BP 138/86 | HR 92 | Ht 66.0 in | Wt 220.8 lb

## 2019-04-06 DIAGNOSIS — E119 Type 2 diabetes mellitus without complications: Secondary | ICD-10-CM

## 2019-04-06 LAB — POCT GLYCOSYLATED HEMOGLOBIN (HGB A1C): Hemoglobin A1C: 6.9 % — AB (ref 4.0–5.6)

## 2019-04-06 MED ORDER — METFORMIN HCL 500 MG PO TABS: 500.0000 mg | ORAL_TABLET | Freq: Two times a day (BID) | ORAL | 3 refills | Status: DC

## 2019-04-06 NOTE — Patient Instructions (Addendum)
If your blood disorder would allow, you should consider having th thyroid removed. If not, Dr Benjamine Mola or I would be happy to drain it whenever you need to. I have sent a prescription to your pharmacy, for the diabetes  If we would drain it here, please come back for a follow-up appointment in 1 month.  If not, please come back for a follow-up appointment in 3 months.

## 2019-04-06 NOTE — Telephone Encounter (Signed)
She will need to have Dr. Benjamine Mola explain the results since he is managing this for her. Unfortunately I will not be able to explain the results or the plan of care going forward regarding this problem.

## 2019-04-06 NOTE — Telephone Encounter (Signed)
Pt called and said Dr. Loanne Drilling printed out the cytology report for the paratoid results but she doesn't understand what it means. Dr. Benjamine Mola office's cancelled her appointment for this Thursday to discuss the results and told pt that she will have to wait until the results come in to be seen. He would be out of the office Thursday, but she advised them Dr. Loanne Drilling has the results that they should have them too, but they would not schedule. She doesn't like Dr. Benjamine Mola and wants to know if you can explain the results to her.

## 2019-04-06 NOTE — Progress Notes (Addendum)
Subjective:    Patient ID: Rhonda Hudson, female    DOB: [birth year], 63 y.o.   MRN: [id number]  HPI Pt is referred by Harland Dingwall, NP, for nodular thyroid.  Pt was noted to have a nodule at the left lobe of the thyroid in 1981.  A cyst was drained then.  She then took an uncertain medication x 1 year.  she has no h/o XRT or surgery to the neck.  She reports moderate swelling at the ant neck, and assoc choking sensation.   She was also noted to have elev A1c recently.  Past Medical History:  Diagnosis Date  . Cataracts, bilateral   . Chronic anticoagulation 07/15/2017  . Diverticulitis   . DVT (deep venous thrombosis) ([place])   . DVT of lower extremity (deep venous thrombosis) ([place]) 01/04/2016  . 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 Hiatal hernia with gastroesophageal reflux    disease  . History of prediabetes   . Insomnia   . Irritable bowel syndrome   . 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 Panic attacks    Hx of  . Pre-diabetes   . Thickened endometrium   . Thyroid nodule 03/03/2019    Past Surgical History:  Procedure Laterality Date  . CHOLECYSTECTOMY    . DILATION AND CURETTAGE OF UTERUS N/A 06/16/2018   Procedure: DILATATION AND CURETTAGE;  Surgeon: Emily Filbert, MD;  Location: [facility];  Service: Gynecology;  Laterality: N/A;  . TOE SURGERY    . TUBAL LIGATION    . US ECHOCARDIOGRAPHY  01-31-2009   EF 60%    Social History   Socioeconomic History  . Marital status: Divorced    Spouse name: Not on file  . Number of children: 2  . Years of education: Not on file  . Highest education level: Not on file  Occupational History  . Occupation: Warehouse manager  . Financial resource strain: Not on file  . Food insecurity    Worry: Not on file    Inability: Not on file  . Transportation needs    Medical: Not on file    Non-medical: Not on file  Tobacco Use  . Smoking status: Current Every Day Smoker    Packs/day: 0.50    Types:  Cigarettes  . Smokeless tobacco: Never Used  Substance and Sexual Activity  . Alcohol use: No    Alcohol/week: 0.0 standard drinks  . Drug use: No  . Sexual activity: Not on file  Lifestyle  . Physical activity    Days per week: Not on file    Minutes per session: Not on file  . Stress: Not on file  Relationships  . Social Herbalist on phone: Not on file    Gets together: Not on file    Attends religious service: Not on file    Active member of club or organization: Not on file    Attends meetings of clubs or organizations: Not on file    Relationship status: Not on file  . Intimate partner violence    Fear of current or ex partner: Not on file    Emotionally abused: Not on file    Physically abused: Not on file    Forced sexual activity: Not on file  Other Topics Concern  . Not on file  Social History Narrative   Lives with two sons   Caffeine use: coffee and coke daily   Right-handed  Current Outpatient Medications on File Prior to Visit  Medication Sig Dispense Refill  . XARELTO 20 MG TABS tablet TAKE 1 TABLET BY MOUTH DAILY WITH SUPPER (Patient taking differently: Take 20 mg by mouth daily with supper. ) 30 tablet 5   No current facility-administered medications on file prior to visit.     No Known Allergies  Family History  Problem Relation Age of Onset  . Diabetes Mother   . Leukemia Father   . Leukemia Sister   . Diabetes Sister   . Leukemia Brother   . Multiple sclerosis Neg Hx   . Autoimmune disease Neg Hx   . Thyroid disease Neg Hx     BP 138/86 (BP Location: Left Arm, Patient Position: Sitting, Cuff Size: Large)   Pulse 92   Ht 5\' 6"  (1.676 m)   Wt 220 lb 12.8 oz (100.2 kg)   SpO2 97%   BMI 35.64 kg/m   Review of Systems Denies weight change, hoarseness, neck pain, diplopia, chest pain, sob, cough, diarrhea, itching, flushing, depression, cold intolerance, headache, numbness, and rhinorrhea.  She has easy bruising.      Objective:   Physical Exam VS: see vs page GEN: no distress HEAD: head: no deformity eyes: no periorbital swelling, no proptosis.  external nose and ears are normal NECK: right thyroid mass is easily noted.   CHEST WALL: no deformity LUNGS: clear to auscultation CV: reg rate and rhythm, no murmur ABD: abdomen is soft, nontender.  no hepatosplenomegaly.  not distended.  no hernia MUSCULOSKELETAL: muscle bulk and strength are grossly normal.  no obvious joint swelling.  gait is steady, with a cane EXTEMITIES: no deformity.  no ulcer on the feet.  feet are of normal color and temp.  Trace bilat leg edema, and bilat vv's.  there is bilateral onychomycosis of the toenails, and calluses on the toes. PULSES: dorsalis pedis intact bilat.  no carotid bruit NEURO:  cn 2-12 grossly intact.   readily moves all 4's.  sensation is intact to touch on the feet SKIN:  Normal texture and temperature.  No rash or suspicious lesion is visible.  Few ecchymoses of the forearms.   NODES:  None palpable at the neck PSYCH: alert, well-oriented.  Does not appear anxious nor depressed.   Lab Results  Component Value Date   TSH 0.846 01/20/2019   [place]: Complex cystic lesion of the right thyroid, 5.5 cm. Left upper thyroid nodule (labeled 2) meets criteria for biopsy.  Left lower thyroid nodule (labeled 3) meets criteria for surveillance.  Cytology: CONSISTENT WITH THE CONTENTS OF A CYST.  SCANT FOLLICULAR EPITHELIUM PRESENT ([place] CATEGORY I).   I have reviewed outside records, and summarized: Pt was noted to have thyroid cyst, and referred here.  She had drainage of the thyroid cyst, with improvement in sxs.   Lab Results  Component Value Date   HGBA1C 6.9 (A) 04/06/2019   Lab Results  Component Value Date   CREATININE 0.86 03/17/2019   BUN 10 03/17/2019   NA 132 (L) 03/17/2019   K 4.1 03/17/2019   CL 97 (L) 03/17/2019   CO2 24 03/17/2019       Assessment & Plan:  MNG, with a predominant cyst.   I told pt she is not disabled for this problem.  Type 2 DM, new.   Patient Instructions  If your blood disorder would allow, you should consider having th thyroid removed. If not, Dr Benjamine Mola or I would be happy to drain it  whenever you need to. I have sent a prescription to your pharmacy, for the diabetes  If we would drain it here, please come back for a follow-up appointment in 1 month.  If not, please come back for a follow-up appointment in 3 months.

## 2019-04-07 NOTE — Telephone Encounter (Signed)
She does not want to go to dr. Benjamine Mola and wants to be sent some where else.

## 2019-04-07 NOTE — Telephone Encounter (Signed)
She will need to establish with a new ENT then.

## 2019-04-07 NOTE — Telephone Encounter (Signed)
Pt was advised to look up [facility] when she got home to call them but she would need to request records from Dr. Benjamine Mola office to them if she decided to see someone else

## 2019-04-08 ENCOUNTER — Ambulatory Visit (INDEPENDENT_AMBULATORY_CARE_PROVIDER_SITE_OTHER): Payer: Medicare HMO | Admitting: Otolaryngology

## 2019-04-14 DIAGNOSIS — D490 Neoplasm of unspecified behavior of digestive system: Secondary | ICD-10-CM | POA: Diagnosis not present

## 2019-04-14 DIAGNOSIS — E041 Nontoxic single thyroid nodule: Secondary | ICD-10-CM | POA: Diagnosis not present

## 2019-05-05 DIAGNOSIS — D110 Benign neoplasm of parotid gland: Secondary | ICD-10-CM | POA: Diagnosis not present

## 2019-05-05 DIAGNOSIS — K118 Other diseases of salivary glands: Secondary | ICD-10-CM | POA: Diagnosis not present

## 2019-05-08 ENCOUNTER — Telehealth: Payer: Self-pay | Admitting: Family Medicine

## 2019-05-08 NOTE — Telephone Encounter (Signed)
PT ASSISTANCE FORM XARELTO filled out,  Need pt's income and signature and insurance info

## 2019-05-10 NOTE — Telephone Encounter (Signed)
Called pt and she will bring by insurance card, copy of income and sign form

## 2019-05-12 ENCOUNTER — Telehealth: Payer: Self-pay | Admitting: Internal Medicine

## 2019-05-12 ENCOUNTER — Other Ambulatory Visit: Payer: Self-pay

## 2019-05-12 ENCOUNTER — Other Ambulatory Visit (INDEPENDENT_AMBULATORY_CARE_PROVIDER_SITE_OTHER): Payer: Medicare HMO

## 2019-05-12 DIAGNOSIS — Z23 Encounter for immunization: Secondary | ICD-10-CM | POA: Diagnosis not present

## 2019-05-12 NOTE — Telephone Encounter (Signed)
Pt came in for a flu shot today and states that she has a rash for couple weeks under her breast from the heat. She thinks its like yeast and she has tried otc and nothing will get rid of it. She wants to know if you will call something in. [facility] [address]

## 2019-05-12 NOTE — Telephone Encounter (Signed)
She can try an over the counter Lamisil or we can do a virtual visit to see what is going on for sure.

## 2019-05-12 NOTE — Telephone Encounter (Signed)
Pt came by office, we completed application and this was faxed

## 2019-05-12 NOTE — Telephone Encounter (Signed)
Pt was advised to try over the counter and it could take a couple weeks to heal and if it doesn't then get to do a virtual

## 2019-06-04 NOTE — Telephone Encounter (Signed)
Pt Assistance denied until pt spends 4% of incomes approx $400 in medications

## 2019-06-11 NOTE — Telephone Encounter (Signed)
Called pharmacy & went thru [facility] for $45 co pay.  Called pt and informed, she doesn't have much out of pocket except Metformin $4 a month so will take a long time to reach the out of pocket $400 for the [facility] and [place] approval.  Pt says she will try to afford the Xarelto because she knows she can't not take this medication.

## 2019-06-25 ENCOUNTER — Telehealth: Payer: Self-pay | Admitting: Internal Medicine

## 2019-06-25 NOTE — Telephone Encounter (Signed)
Pt coming in monday

## 2019-06-25 NOTE — Telephone Encounter (Signed)
Pt called and states she is having back pain that started Monday and wants to come in to make sure its not her kidneys. She might think its her mattress causing her back pain. Please advise if she needs to come in

## 2019-06-25 NOTE — Telephone Encounter (Signed)
She can schedule a visit Monday for this or if she gets worse over the weekend she may have to go to urgent care or the emergency department. Let her know that I am not in the office this afternoon. Thanks.

## 2019-06-28 ENCOUNTER — Ambulatory Visit: Payer: Medicare HMO | Admitting: Family Medicine

## 2019-06-29 ENCOUNTER — Ambulatory Visit (INDEPENDENT_AMBULATORY_CARE_PROVIDER_SITE_OTHER): Payer: Medicare HMO | Admitting: Family Medicine

## 2019-06-29 ENCOUNTER — Other Ambulatory Visit: Payer: Self-pay

## 2019-06-29 ENCOUNTER — Encounter: Payer: Self-pay | Admitting: Family Medicine

## 2019-06-29 VITALS — BP 120/82 | HR 86 | Temp 98.6°F

## 2019-06-29 DIAGNOSIS — I82592 Chronic embolism and thrombosis of other specified deep vein of left lower extremity: Secondary | ICD-10-CM | POA: Diagnosis not present

## 2019-06-29 DIAGNOSIS — M545 Low back pain, unspecified: Secondary | ICD-10-CM

## 2019-06-29 DIAGNOSIS — M47817 Spondylosis without myelopathy or radiculopathy, lumbosacral region: Secondary | ICD-10-CM | POA: Diagnosis not present

## 2019-06-29 DIAGNOSIS — D229 Melanocytic nevi, unspecified: Secondary | ICD-10-CM

## 2019-06-29 DIAGNOSIS — I83812 Varicose veins of left lower extremities with pain: Secondary | ICD-10-CM | POA: Diagnosis not present

## 2019-06-29 DIAGNOSIS — Z7901 Long term (current) use of anticoagulants: Secondary | ICD-10-CM | POA: Diagnosis not present

## 2019-06-29 DIAGNOSIS — D682 Hereditary deficiency of other clotting factors: Secondary | ICD-10-CM

## 2019-06-29 LAB — POCT URINALYSIS DIP (PROADVANTAGE DEVICE)
Bilirubin, UA: NEGATIVE
Blood, UA: NEGATIVE
Glucose, UA: NEGATIVE mg/dL
Ketones, POC UA: NEGATIVE mg/dL
Leukocytes, UA: NEGATIVE
Nitrite, UA: NEGATIVE
Protein Ur, POC: NEGATIVE mg/dL
Specific Gravity, Urine: 1.015
Urobilinogen, Ur: NEGATIVE
pH, UA: 6 (ref 5.0–8.0)

## 2019-06-29 NOTE — Progress Notes (Signed)
Subjective:    Patient ID: Rhonda Hudson, female    DOB: [birth year], 63 y.o.   MRN: [id number]  HPI Chief Complaint  Patient presents with  . Possible UTI    Possible UTI. a week ago  mid back to left side.    Here with complaints of midline low back pain that is chronic. States after making a trip to [place] and back and sleeping on different mattresses, she had some worsening pain. States she wants to make sure she does not have a kidney infection. Denies urinary frequency, urgency, odor or dysuria.   She has been under the care of Dr. Ernestina Patches in the past for spondylosis of lumbar region. States she has not followed up as advised.  Denies numbness, tingling or weakness. No loss of control of bowels or bladder.  She is ambulating with a cane today which is not new.   Denies fever, chills, unexplained weight loss, chest pain, palpitations, abdominal pain, N/V/D.   Complains of worsening varicose vein on her left anterior leg. States it is very tender and causing her discomfort. States she is afraid her dog is going to rupture it accidentally. Denies seeing vein and vascular in the past.   States she has 2 moles on her face that she needs to have taken care of. States she has been to [facility] [address] in the past and Dr. Allyson Sabal is her dermatologist. She plans to contact his office to be seen.   States her blood sugars have been "fine". Is under the care of Dr. Loanne Drilling for diabetes and has an appt later this month. States he is also monitoring her thyroid.  She is not on a statin and has refused in the past. She will ask Dr. Loanne Drilling about this.   History of multiple DVTs and factor II def.  On Xarelto. Doing fine. Denies bleeding.   Reviewed allergies, medications, past medical, surgical, family, and social history.    Review of Systems Pertinent positives and negatives in the history of present illness.     Objective:   Physical Exam  Constitutional:      General: She is not in acute distress.    Appearance: Normal appearance.  Neck:     Musculoskeletal: Normal range of motion and neck supple.  Cardiovascular:     Rate and Rhythm: Normal rate.     Pulses: Normal pulses.  Pulmonary:     Effort: Pulmonary effort is normal.     Breath sounds: Normal breath sounds.  Abdominal:     General: Abdomen is flat. There is no distension.     Tenderness: There is no abdominal tenderness. There is no right CVA tenderness or left CVA tenderness.  Musculoskeletal:     Lumbar back: She exhibits decreased range of motion, tenderness and pain.     Left lower leg: She exhibits tenderness.     Comments: Midline lumbar and lumbar paraspinal muscles TTP. Limited ROM due to pain.  DTRs symmetric.  Gait abnormal. Using cane.   Varicose vein to anterior LLE. No erythema or edema.   Skin:    General: Skin is warm and dry.     Capillary Refill: Capillary refill takes less than 2 seconds.     Comments: 2 large asymmetrical, large nevi on her left forehead. These are abnormally hyperpigmented. Suspicious.   Neurological:     General: No focal deficit present.     Mental Status: She is alert and oriented to  person, place, and time.     Cranial Nerves: Cranial nerves are intact.     Sensory: Sensation is intact.    BP 120/82   Pulse 86   Temp 98.6 F (37 C)       Assessment & Plan:  Spondylosis of lumbosacral region without myelopathy or radiculopathy -no red flag symptoms. This is managed by Dr. Ernestina Patches. She has not followed up but will call and schedule.   Midline low back pain without sciatica, unspecified chronicity - Plan: POCT Urinalysis DIP (Proadvantage Device) -UA negative. Most likely related to spondylosis and will follow up with Dr. Ernestina Patches.   Atypical Nevi- follow up with Dr. Ledell Peoples office  Varicose veins of left lower extremity with pain - Plan: Ambulatory referral to Vascular Surgery - no sign of infection or  obvious inflammation.   Chronic anticoagulation- doing fine on Xarelto. No sign of bleeding. Continue indefinitely.   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

## 2019-06-29 NOTE — Patient Instructions (Signed)
Your urine is normal today. You may want to follow up with your orthopedist about your back.    Call and schedule with Dr. Ledell Peoples office for the moles on your forehead.   The [facility] office will call you to schedule a visit.   Please talk with Dr. Loanne Drilling about getting on a statin.

## 2019-07-05 ENCOUNTER — Other Ambulatory Visit: Payer: Self-pay

## 2019-07-07 ENCOUNTER — Other Ambulatory Visit: Payer: Self-pay | Admitting: Family Medicine

## 2019-07-07 ENCOUNTER — Other Ambulatory Visit: Payer: Self-pay

## 2019-07-07 ENCOUNTER — Encounter: Payer: Self-pay | Admitting: Family Medicine

## 2019-07-07 ENCOUNTER — Ambulatory Visit (INDEPENDENT_AMBULATORY_CARE_PROVIDER_SITE_OTHER): Payer: Medicare HMO | Admitting: Endocrinology

## 2019-07-07 ENCOUNTER — Encounter: Payer: Self-pay | Admitting: Endocrinology

## 2019-07-07 ENCOUNTER — Telehealth: Payer: Self-pay | Admitting: Internal Medicine

## 2019-07-07 VITALS — BP 122/60 | HR 99 | Ht 66.0 in | Wt 215.6 lb

## 2019-07-07 DIAGNOSIS — E119 Type 2 diabetes mellitus without complications: Secondary | ICD-10-CM

## 2019-07-07 DIAGNOSIS — E7800 Pure hypercholesterolemia, unspecified: Secondary | ICD-10-CM | POA: Insufficient documentation

## 2019-07-07 LAB — POCT GLYCOSYLATED HEMOGLOBIN (HGB A1C): Hemoglobin A1C: 6.3 % — AB (ref 4.0–5.6)

## 2019-07-07 MED ORDER — ATORVASTATIN CALCIUM 10 MG PO TABS: 10.0000 mg | ORAL_TABLET | Freq: Every day | ORAL | 1 refills | Status: DC

## 2019-07-07 NOTE — Telephone Encounter (Signed)
Pt saw Dr. Loanne Drilling today and he does not put people on statins. He advised her to call us about getting this. Please advise

## 2019-07-07 NOTE — Progress Notes (Signed)
Subjective:    Patient ID: Rhonda Hudson, female    DOB: 1956-06-28, 63 y.o.   MRN: [id number]  HPI Pt returns for f/u of diabetes mellitus:  DM type: 2 Dx'ed: [id number] Complications: none Therapy: metformin GDM: never DKA: never Severe hypoglycemia: never Pancreatitis: never Pancreatic imaging: normal on 2010 CT Other: she has never taken insulin Interval history: she takes metformin as rx'ed, and tolerates well Pt also resturns for f/u of MNG (dx'ed 1981; a cyst was drained then; she then took an uncertain medication x 1 year, but not since; [place] (2020) showed 5.5 cm right lobe cyst; Left upper thyroid nodule (labeled 2) meets criteria for biopsy.  Left lower thyroid nodule (labeled 3) meets criteria for surveillance; Cytology (2020) was cat 1). Pt says the goiter seems unchanged. Past Medical History:  Diagnosis Date  . Cataracts, bilateral   . Chronic anticoagulation 07/15/2017  . Diverticulitis   . DVT (deep venous thrombosis) ([place])   . DVT of lower extremity (deep venous thrombosis) ([place]) 01/04/2016  . 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 Hiatal hernia with gastroesophageal reflux    disease  . History of prediabetes   . Insomnia   . Irritable bowel syndrome   . 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 Panic attacks    Hx of  . Thickened endometrium   . Thyroid nodule 03/03/2019    Past Surgical History:  Procedure Laterality Date  . CHOLECYSTECTOMY    . DILATION AND CURETTAGE OF UTERUS N/A 06/16/2018   Procedure: DILATATION AND CURETTAGE;  Surgeon: Emily Filbert, MD;  Location: [facility];  Service: Gynecology;  Laterality: N/A;  . TOE SURGERY    . TUBAL LIGATION    . US ECHOCARDIOGRAPHY  01-31-2009   EF 60%    Social History   Socioeconomic History  . Marital status: Divorced    Spouse name: Not on file  . Number of children: 2  . Years of education: Not on file  . Highest education level: Not on file  Occupational History  . Occupation: Building services engineer  . Financial resource strain: Not on file  . Food insecurity    Worry: Not on file    Inability: Not on file  . Transportation needs    Medical: Not on file    Non-medical: Not on file  Tobacco Use  . Smoking status: Current Every Day Smoker    Packs/day: 0.50    Types: Cigarettes  . Smokeless tobacco: Never Used  Substance and Sexual Activity  . Alcohol use: No    Alcohol/week: 0.0 standard drinks  . Drug use: No  . Sexual activity: Not on file  Lifestyle  . Physical activity    Days per week: Not on file    Minutes per session: Not on file  . Stress: Not on file  Relationships  . Social Herbalist on phone: Not on file    Gets together: Not on file    Attends religious service: Not on file    Active member of club or organization: Not on file    Attends meetings of clubs or organizations: Not on file    Relationship status: Not on file  . Intimate partner violence    Fear of current or ex partner: Not on file    Emotionally abused: Not on file    Physically abused: Not on file    Forced sexual activity: Not on file  Other Topics Concern  . Not on file  Social History Narrative   Lives with two sons   Caffeine use: coffee and coke daily   Right-handed    Current Outpatient Medications on File Prior to Visit  Medication Sig Dispense Refill  . metFORMIN (GLUCOPHAGE) 500 MG tablet Take 1 tablet (500 mg total) by mouth 2 (two) times daily with a meal. 90 tablet 3  . XARELTO 20 MG TABS tablet TAKE 1 TABLET BY MOUTH DAILY WITH SUPPER (Patient taking differently: Take 20 mg by mouth daily with supper. ) 30 tablet 5   No current facility-administered medications on file prior to visit.     No Known Allergies  Family History  Problem Relation Age of Onset  . Diabetes Mother   . Leukemia Father   . Leukemia Sister   . Diabetes Sister   . Leukemia Brother   . Multiple sclerosis Neg Hx   . Autoimmune disease Neg Hx   . Thyroid  disease Neg Hx     BP 122/60 (BP Location: Left Arm, Patient Position: Sitting, Cuff Size: Large)   Pulse 99   Ht 5\' 6"  (1.676 m)   Wt 215 lb 9.6 oz (97.8 kg)   SpO2 96%   BMI 34.80 kg/m    Review of Systems Denies neck pain and nausea.      Objective:   Physical Exam VITAL SIGNS:  See vs page GENERAL: no distress NECK: no mass is palpable today.  There is no palpable thyroid enlargement.  No thyroid nodule is palpable.  No palpable lymphadenopathy at the anterior neck. Pulses: dorsalis pedis intact bilat.   MSK: no deformity of the feet.   CV: trace bilat leg edema, and bilat vv's.   Skin:  no ulcer on the feet, but there are heavy calluses.  normal color and temp on the feet.   Neuro: sensation is intact to touch on the feet.  Ext: there is bilateral onychomycosis of the toenails.    Lab Results  Component Value Date   HGBA1C 6.3 (A) 07/07/2019       Assessment & Plan:  Type 2 DM: well-controlled MNG: clinically stable  Patient Instructions  Please continue the same metformin.  We can continue to follow the thyroid for now.   Please come back for a follow-up appointment in 6 months.

## 2019-07-07 NOTE — Telephone Encounter (Signed)
Pt was notified and she is willing to take a statin. Please send in statin and pt is schedule for a follow-up appt on 12/9 fasting

## 2019-07-07 NOTE — Telephone Encounter (Signed)
Done

## 2019-07-07 NOTE — Patient Instructions (Addendum)
Please continue the same metformin.  We can continue to follow the thyroid for now.   Please come back for a follow-up appointment in 6 months.

## 2019-07-07 NOTE — Telephone Encounter (Signed)
Is she now willing to take a statin? I am happy to prescribe this for her and have her return after 6 weeks for repeat labs.

## 2019-07-14 ENCOUNTER — Telehealth: Payer: Self-pay | Admitting: Internal Medicine

## 2019-07-14 DIAGNOSIS — M545 Low back pain, unspecified: Secondary | ICD-10-CM

## 2019-07-14 DIAGNOSIS — M47817 Spondylosis without myelopathy or radiculopathy, lumbosacral region: Secondary | ICD-10-CM

## 2019-07-14 NOTE — Telephone Encounter (Signed)
Referral placed through [facility] and pt was notified

## 2019-07-14 NOTE — Telephone Encounter (Signed)
Pt called and states that she wants a referral to ortho. [facility] is closed due to moving so she needs to see someone soon. Ok to put a referral in

## 2019-07-14 NOTE — Telephone Encounter (Signed)
Please handle, thanks.

## 2019-07-16 ENCOUNTER — Other Ambulatory Visit: Payer: Self-pay

## 2019-07-16 DIAGNOSIS — I83892 Varicose veins of left lower extremities with other complications: Secondary | ICD-10-CM

## 2019-07-22 ENCOUNTER — Telehealth (HOSPITAL_COMMUNITY): Payer: Self-pay

## 2019-07-22 NOTE — Telephone Encounter (Signed)

## 2019-07-23 ENCOUNTER — Ambulatory Visit (INDEPENDENT_AMBULATORY_CARE_PROVIDER_SITE_OTHER): Payer: Medicare HMO | Admitting: Physician Assistant

## 2019-07-23 ENCOUNTER — Ambulatory Visit (HOSPITAL_COMMUNITY)
Admission: RE | Admit: 2019-07-23 | Discharge: 2019-07-23 | Disposition: A | Discharge status: Home / Self Care | Payer: Medicare HMO | Source: Ambulatory Visit | Attending: Family | Admitting: Family

## 2019-07-23 ENCOUNTER — Other Ambulatory Visit: Payer: Self-pay

## 2019-07-23 VITALS — BP 141/88 | HR 75 | Temp 98.0°F | Resp 14 | Ht 69.0 in | Wt 217.0 lb

## 2019-07-23 DIAGNOSIS — I83892 Varicose veins of left lower extremities with other complications: Secondary | ICD-10-CM | POA: Diagnosis not present

## 2019-07-23 NOTE — Progress Notes (Signed)
[facility] [place]   Reason for referral: painful right anterior leg varicose vein.  History of Present Illness  Rhonda Hudson is a 63 y.o. female who presents with chief complaint: swollenpainful right anterior leg varicose vein. .  Patient notes, onset of swelling 6-12 months ago, associated with pain during rest and touch.  The patient has had a history of DVT times 2 since 2017, positive history of varicose vein, no history of venous stasis ulcers, no history of  Lymphedema and positive history of skin changes in lower legs due to multiple clumps of telangiectasis.  There is a family history of venous disorders.  The patient has  used compression stockings in the past.  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      Past Medical History:  Diagnosis Date  . Cataracts, bilateral   . Chronic anticoagulation 07/15/2017  . Diverticulitis   . DVT (deep venous thrombosis) ([place])   . DVT of lower extremity (deep venous thrombosis) ([place]) 01/04/2016  . 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 Hiatal hernia with gastroesophageal reflux    disease  . History of prediabetes   . Insomnia   . Irritable bowel syndrome   . 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 Panic attacks    Hx of  . Thickened endometrium   . Thyroid nodule 03/03/2019    Past Surgical History:  Procedure Laterality Date  . CHOLECYSTECTOMY    . DILATION AND CURETTAGE OF UTERUS N/A 06/16/2018   Procedure: DILATATION AND CURETTAGE;  Surgeon: Emily Filbert, MD;  Location: [facility];  Service: Gynecology;  Laterality: N/A;  . TOE SURGERY    . TUBAL LIGATION    . US ECHOCARDIOGRAPHY  01-31-2009   EF 60%    Social History   Socioeconomic History  . Marital status: Divorced    Spouse name: Not on file  . Number of children: 2  . Years of education: Not on file  . Highest education level: Not on file  Occupational History  . Occupation:  Warehouse manager  . Financial resource strain: Not on file  . Food insecurity    Worry: Not on file    Inability: Not on file  . Transportation needs    Medical: Not on file    Non-medical: Not on file  Tobacco Use  . Smoking status: Current Every Day Smoker    Packs/day: 0.50    Types: Cigarettes  . Smokeless tobacco: Never Used  Substance and Sexual Activity  . Alcohol use: No    Alcohol/week: 0.0 standard drinks  . Drug use: No  . Sexual activity: Not on file  Lifestyle  . Physical activity    Days per week: Not on file    Minutes per session: Not on file  . Stress: Not on file  Relationships  . Social Herbalist on phone: Not on file    Gets together: Not on file    Attends religious service: Not on file    Active member of club or organization: Not on file    Attends meetings of clubs or organizations: Not on file    Relationship status: Not on file  . Intimate partner violence    Fear of current or ex partner: Not on file    Emotionally abused: Not on file    Physically abused: Not on file  Forced sexual activity: Not on file  Other Topics Concern  . Not on file  Social History Narrative   Lives with two sons   Caffeine use: coffee and coke daily   Right-handed    Family History  Problem Relation Age of Onset  . Diabetes Mother   . Leukemia Father   . Leukemia Sister   . Diabetes Sister   . Leukemia Brother   . Multiple sclerosis Neg Hx   . Autoimmune disease Neg Hx   . Thyroid disease Neg Hx     Current Outpatient Medications on File Prior to Visit  Medication Sig Dispense Refill  . atorvastatin (LIPITOR) 10 MG tablet Take 1 tablet (10 mg total) by mouth daily. 90 tablet 1  . metFORMIN (GLUCOPHAGE) 500 MG tablet Take 1 tablet (500 mg total) by mouth 2 (two) times daily with a meal. 90 tablet 3  . XARELTO 20 MG TABS tablet TAKE 1 TABLET BY MOUTH DAILY WITH SUPPER (Patient taking differently: Take 20 mg by mouth daily with  supper. ) 30 tablet 5   No current facility-administered medications on file prior to visit.     Allergies as of 07/23/2019  . (No Known Allergies)     ROS:   General:  No weight loss, Fever, chills  HEENT: No recent headaches, no nasal bleeding, no visual changes, no sore throat  Neurologic: No dizziness, blackouts, seizures. No recent symptoms of stroke or mini- stroke. No recent episodes of slurred speech, or temporary blindness.  Cardiac: No recent episodes of chest pain/pressure, no shortness of breath at rest.  No shortness of breath with exertion.  Denies history of atrial fibrillation or irregular heartbeat  Vascular: No history of rest pain in feet.  No history of claudication.  No history of non-healing ulcer, + history of DVT   Pulmonary: No home oxygen, no productive cough, no hemoptysis,  No asthma or wheezing  Musculoskeletal:  [ ]  Arthritis, [ ]  Low back pain,  [ ]  Joint pain  Hematologic:No history of hypercoagulable state.  No history of easy bleeding.  No history of anemia  Gastrointestinal: No hematochezia or melena,  No gastroesophageal reflux, no trouble swallowing  Urinary: [ ]  chronic Kidney disease, [ ]  on HD - [ ]  MWF or [ ]  TTHS, [ ]  Burning with urination, [ ]  Frequent urination, [ ]  Difficulty urinating;   Skin: No rashes  Psychological: No history of anxiety,  No history of depression  Physical Examination  Vitals:   07/23/19 1437  BP: (!) 141/88  Pulse: 75  Resp: 14  Temp: 98 F (36.7 C)  TempSrc: Temporal  SpO2: 98%  Weight: 217 lb (98.4 kg)  Height: 5\' 9"  (1.753 m)    Body mass index is 32.05 kg/m.  General:  Alert and oriented, no acute distress HEENT: Normal Neck: No bruit or JVD Pulmonary: Clear to auscultation bilaterally Cardiac: Regular Rate and Rhythm without murmur Abdomen: Soft, non-tender, non-distended, no mass, no scars Skin: No rash Extremity Pulses:  2+ radial, brachial, femoral, dorsalis pedis, posterior  tibial pulses bilaterally   Painful to palpation over the varicose vein.   Musculoskeletal: No deformity or edema  Neurologic: Upper and lower extremity motor 5/5 and symmetric  DATA:   Left Technical Findings: No evidence of DVT, SVT, or Baker's cyst. The sapheno-femoral junction is incompetent. The GSV demonstrates reflux from the sapheno-femoral junction to the calf. No evidence of SSV reflux. No evidence of deep venous reflux.   +--------------+------+---------+--------+--------+ LEFT  RefluxReflux NoDiameterComments                Yes                            +--------------+------+---------+--------+--------+ CFV                    no                     +--------------+------+---------+--------+--------+ FV prox                no                     +--------------+------+---------+--------+--------+ Popliteal              no                     +--------------+------+---------+--------+--------+ GSV at SFJ     yes            0.458           +--------------+------+---------+--------+--------+ GSV prox thigh yes            0.547           +--------------+------+---------+--------+--------+ GSV mid thigh  yes            0.487           +--------------+------+---------+--------+--------+ GSV dist thigh yes            0.614           +--------------+------+---------+--------+--------+ GSV at knee    yes            0.452           +--------------+------+---------+--------+--------+ GSV prox calf                 0.402           +--------------+------+---------+--------+--------+ GSV mid calf                  0.297           +--------------+------+---------+--------+--------+ SSV Pop Fossa                 0.516           +--------------+------+---------+--------+--------+ SSV prox calf          no     0.434           +--------------+------+---------+--------+--------+ SSV mid  calf           no     0.352           +--------------+------+---------+--------+--------+       Summary: Right: No evidence of common femoral vein obstruction. Left: There is no evidence of deep vein thrombosis in the lower extremity. There is no evidence of superficial venous thrombosis. No cystic structure found in the popliteal fossa.     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     Plan: I have recommended compression socks.  She states she has tried them in the past and can't tolerate them.  She want to try a compression calf sleeve to protect the prominent vein from injury and touch.  She is not interested in any invasive procedures to eliminate the vein at this time.  She will f/u PRN if she develops intolerable pain or  bleeding injury from the vein.    Roxy Horseman PA-C [facility] Office: [phone]  MD on call Early

## 2019-08-10 DIAGNOSIS — R350 Frequency of micturition: Secondary | ICD-10-CM | POA: Diagnosis not present

## 2019-08-10 DIAGNOSIS — N281 Cyst of kidney, acquired: Secondary | ICD-10-CM | POA: Diagnosis not present

## 2019-08-10 DIAGNOSIS — R3915 Urgency of urination: Secondary | ICD-10-CM | POA: Diagnosis not present

## 2019-08-10 DIAGNOSIS — R102 Pelvic and perineal pain: Secondary | ICD-10-CM | POA: Diagnosis not present

## 2019-08-16 DIAGNOSIS — N950 Postmenopausal bleeding: Secondary | ICD-10-CM | POA: Diagnosis not present

## 2019-08-23 ENCOUNTER — Telehealth: Payer: Self-pay | Admitting: Internal Medicine

## 2019-08-23 ENCOUNTER — Other Ambulatory Visit: Payer: Self-pay | Admitting: Internal Medicine

## 2019-08-23 DIAGNOSIS — E7800 Pure hypercholesterolemia, unspecified: Secondary | ICD-10-CM

## 2019-08-23 NOTE — Telephone Encounter (Signed)
Pt can come in for labs and then do her appt virtual on Wednesday or come in and do everything at once on Wednesday.  But she does need fasting labs if been on statin med

## 2019-08-24 DIAGNOSIS — N950 Postmenopausal bleeding: Secondary | ICD-10-CM | POA: Diagnosis not present

## 2019-08-24 DIAGNOSIS — R9389 Abnormal findings on diagnostic imaging of other specified body structures: Secondary | ICD-10-CM | POA: Diagnosis not present

## 2019-08-25 ENCOUNTER — Other Ambulatory Visit: Payer: Self-pay

## 2019-08-25 ENCOUNTER — Ambulatory Visit (INDEPENDENT_AMBULATORY_CARE_PROVIDER_SITE_OTHER): Payer: Medicare HMO | Admitting: Family Medicine

## 2019-08-25 ENCOUNTER — Encounter: Payer: Self-pay | Admitting: Family Medicine

## 2019-08-25 VITALS — BP 120/80 | HR 78 | Temp 97.1°F

## 2019-08-25 DIAGNOSIS — R221 Localized swelling, mass and lump, neck: Secondary | ICD-10-CM | POA: Diagnosis not present

## 2019-08-25 DIAGNOSIS — E7800 Pure hypercholesterolemia, unspecified: Secondary | ICD-10-CM

## 2019-08-25 DIAGNOSIS — Z79899 Other long term (current) drug therapy: Secondary | ICD-10-CM | POA: Diagnosis not present

## 2019-08-25 DIAGNOSIS — R1310 Dysphagia, unspecified: Secondary | ICD-10-CM | POA: Diagnosis not present

## 2019-08-25 NOTE — Progress Notes (Signed)
   Subjective:  Documentation for virtual audio and video telecommunications through [place] encounter:  The patient was located at home. 2 patient identifiers used.  The provider was located in the office. The patient did consent to this visit and is aware of possible charges through their insurance for this visit.  The other persons participating in this telemedicine service were none.    Patient ID: Rhonda Hudson, female    DOB: 1956-05-07, 63 y.o.   MRN: [id number]  HPI Chief Complaint  Patient presents with  . follow-up on statin    follow-up on statin. been taking med daily   This if a follow up visit for hyperlipidemia. She started on atorvastatin 10 mg. Starts taking it daily without any side effects.  She came in this morning for vitals and labs.    States she is having vaginal bleeding again and has been seeing her gynecologist for this.  Denies fever, chills, N/V/D, myalgias.   Reviewed allergies, medications, past medical, surgical, family, and social history.    Review of Systems Pertinent positives and negatives in the history of present illness.     Objective:   Physical Exam BP 120/80   Pulse 78   Temp (!) 97.1 F (36.2 C)   Alert and oriented and in no acute distress. Respirations unlabored. Normal speech, mood and thought process.       Assessment & Plan:  Elevated LDL cholesterol level - Plan: Lipid Panel, ALT  Medication management - Plan: ALT  Virtual visit to discuss HL and statin therapy. She seems to be doing fine on the low dose atorvastatin. Counseling on healthy diet and activity. She will continue on statin and I will follow up based on lab results.  Discussed following recommendations of her gynecologist and other specialists.   Time spent on call was 11 minutes and in review of previous records 15 minutes total.  This virtual service is not related to other E/M service within previous 7 days.

## 2019-08-26 LAB — LIPID PANEL
Chol/HDL Ratio: 2.9 ratio (ref 0.0–4.4)
Cholesterol, Total: 154 mg/dL (ref 100–199)
HDL: 53 mg/dL (ref >39)
LDL Chol Calc (NIH): 86 mg/dL (ref 0–99)
Triglycerides: 79 mg/dL (ref 0–149)
VLDL Cholesterol Cal: 15 mg/dL (ref 5–40)

## 2019-08-26 LAB — ALT: ALT: 14 IU/L (ref 0–32)

## 2019-08-26 NOTE — Progress Notes (Signed)
Her cholesterol has improved. Continue on the statin. Refill if needed please. Thanks.

## 2019-08-27 ENCOUNTER — Telehealth: Payer: Self-pay | Admitting: Family Medicine

## 2019-08-27 NOTE — Telephone Encounter (Signed)
She wants [name] to call OBGYN and speak to Dr that she saw Tuesday  And figure out what they need to do for her  Doctor said she was high risk for surgery but she said you said that she could have surgery and she wants you to talk to that doctor  Pt is aware that Loletha Carrow out of office today

## 2019-08-27 NOTE — Telephone Encounter (Signed)
Please let [name] know that I am not back in the office until late next week. Please get the name and number of her OB/GYN and I will attempt to get in touch with him/her at that time.  Also, let [name] know that I would go with the recommendation of her OB/GYN as whether the benefit of her having surgery would outweigh the risks. I really cannot answer that for her.

## 2019-08-30 NOTE — Telephone Encounter (Signed)
Pt called and said its Dr. Benjie Karvonen with [facility]. [phone]. Pt states she is bleeding very heavy and having to take like 3-4 showers a day

## 2019-08-30 NOTE — Telephone Encounter (Signed)
Called pt and notified her that she needs to call the obgyn and schedule an appt

## 2019-09-12 ENCOUNTER — Other Ambulatory Visit: Payer: Self-pay | Admitting: Family Medicine

## 2019-09-14 DIAGNOSIS — N8500 Endometrial hyperplasia, unspecified: Secondary | ICD-10-CM | POA: Diagnosis not present

## 2019-09-16 ENCOUNTER — Other Ambulatory Visit: Payer: Self-pay

## 2019-09-16 MED ORDER — METFORMIN HCL 500 MG PO TABS: 500.0000 mg | ORAL_TABLET | Freq: Two times a day (BID) | ORAL | 3 refills | Status: DC

## 2019-09-20 ENCOUNTER — Telehealth: Payer: Self-pay | Admitting: Internal Medicine

## 2019-09-20 NOTE — Telephone Encounter (Signed)
Dr. Benjie Karvonen with [facility] would like to know how long patient can come off xarelto for a hysterectomy and then go back on it after surgery.   Call on her cell phone [phone]

## 2019-09-20 NOTE — Telephone Encounter (Signed)
Please call [name] and let her know that Dr. Benjie Karvonen and I talked and Dr. Gardiner Coins office will contact her regarding her surgery and holding her Xarelto 2 days prior to the surgery. She will most likely be able to restart the medication the day following surgery as long as her labs are fine. Dr. Benjie Karvonen will check labs following surgery and see her 2 weeks post operation. I am here if any more questions or concerns.

## 2019-09-20 NOTE — Telephone Encounter (Signed)
Pt was notified.  

## 2019-10-21 ENCOUNTER — Other Ambulatory Visit: Payer: Self-pay | Admitting: Obstetrics & Gynecology

## 2019-10-22 ENCOUNTER — Other Ambulatory Visit: Payer: Self-pay | Admitting: Obstetrics & Gynecology

## 2019-10-26 ENCOUNTER — Ambulatory Visit: Payer: Medicare HMO | Admitting: Family Medicine

## 2019-10-26 DIAGNOSIS — Z961 Presence of intraocular lens: Secondary | ICD-10-CM | POA: Diagnosis not present

## 2019-10-26 DIAGNOSIS — H2511 Age-related nuclear cataract, right eye: Secondary | ICD-10-CM | POA: Diagnosis not present

## 2019-10-26 DIAGNOSIS — H26492 Other secondary cataract, left eye: Secondary | ICD-10-CM | POA: Diagnosis not present
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
## 2019-10-26 NOTE — Patient Instructions (Signed)
  Rhonda Hudson , Thank you for taking time to come for your Medicare Wellness Visit. I appreciate your ongoing commitment to your health goals. Please review the following plan we discussed and let me know if I can assist you in the future.   These are the goals we discussed: Goals   None     This is a list of the screening recommended for you and due dates:  Health Maintenance  Topic Date Due  .  Hepatitis C: One time screening is recommended by Center for Disease Control  (CDC) for  adults born from [birth year] through 1967.   1956/08/09  . Complete foot exam   03/09/1964  . Eye exam for diabetics  03/09/1964  . HIV Screening  03/09/1969  . Colon Cancer Screening  03/09/2004  . Mammogram  02/26/2017  . DEXA scan (bone density measurement)  03/10/2019  . Pneumonia vaccines (1 of 2 - PCV13) 03/10/2019  . Hemoglobin A1C  01/05/2020  . Urine Protein Check  01/20/2020  . Pap Smear  05/25/2021  . Tetanus Vaccine  01/22/2027  . Flu Shot  Completed

## 2019-10-27 ENCOUNTER — Ambulatory Visit (INDEPENDENT_AMBULATORY_CARE_PROVIDER_SITE_OTHER): Payer: Medicare HMO | Admitting: Family Medicine

## 2019-10-27 ENCOUNTER — Other Ambulatory Visit: Payer: Self-pay

## 2019-10-27 ENCOUNTER — Encounter: Payer: Self-pay | Admitting: Family Medicine

## 2019-10-27 VITALS — BP 130/88 | HR 84 | Ht 68.0 in | Wt 217.4 lb

## 2019-10-27 DIAGNOSIS — Z1159 Encounter for screening for other viral diseases: Secondary | ICD-10-CM

## 2019-10-27 DIAGNOSIS — E119 Type 2 diabetes mellitus without complications: Secondary | ICD-10-CM | POA: Diagnosis not present

## 2019-10-27 DIAGNOSIS — E7800 Pure hypercholesterolemia, unspecified: Secondary | ICD-10-CM | POA: Diagnosis not present

## 2019-10-27 DIAGNOSIS — Z114 Encounter for screening for human immunodeficiency virus [HIV]: Secondary | ICD-10-CM

## 2019-10-27 DIAGNOSIS — F419 Anxiety disorder, unspecified: Secondary | ICD-10-CM

## 2019-10-27 DIAGNOSIS — Z7901 Long term (current) use of anticoagulants: Secondary | ICD-10-CM | POA: Diagnosis not present

## 2019-10-27 DIAGNOSIS — F32A Depression, unspecified: Secondary | ICD-10-CM | POA: Insufficient documentation

## 2019-10-27 DIAGNOSIS — Z7189 Other specified counseling: Secondary | ICD-10-CM

## 2019-10-27 DIAGNOSIS — D682 Hereditary deficiency of other clotting factors: Secondary | ICD-10-CM | POA: Diagnosis not present

## 2019-10-27 DIAGNOSIS — Z5320 Procedure and treatment not carried out because of patient's decision for unspecified reasons: Secondary | ICD-10-CM

## 2019-10-27 DIAGNOSIS — F17200 Nicotine dependence, unspecified, uncomplicated: Secondary | ICD-10-CM | POA: Diagnosis not present

## 2019-10-27 DIAGNOSIS — Z0000 Encounter for general adult medical examination without abnormal findings: Secondary | ICD-10-CM | POA: Diagnosis not present

## 2019-10-27 DIAGNOSIS — R760 Raised antibody titer: Secondary | ICD-10-CM | POA: Diagnosis not present

## 2019-10-27 DIAGNOSIS — Z23 Encounter for immunization: Secondary | ICD-10-CM

## 2019-10-27 DIAGNOSIS — F329 Major depressive disorder, single episode, unspecified: Secondary | ICD-10-CM

## 2019-10-27 NOTE — Patient Instructions (Addendum)
DUE TO COVID-19 ONLY ONE VISITOR IS ALLOWED IN WAITING ROOM (VISITOR WILL HAVE A TEMPERATURE CHECK ON ARRIVAL AND MUST WEAR A FACE MASK THE ENTIRE TIME.)  ONCE YOU ARE ADMITTED TO YOUR PRIVATE ROOM, THE SAME ONE VISITOR IS ALLOWED TO VISIT DURING VISITING HOURS ONLY.  Your COVID swab testing is scheduled for: 11/01/19  at 11:30 am   , You must self quarantine after your testing per handout given to you at the testing site.  ([facility] up testing enter pre-surgical testing line)    Your procedure is scheduled on: 11/04/19  Report to:  [facility] AT: 11:00  A. M.   Call this number if you have problems the morning of surgery:[phone].   OUR ADDRESS IS [address].  WE ARE LOCATED IN THE [place]                                   [facility].                                     REMEMBER:   DO NOT EAT FOOD AFTER MIDNIGHT. CLEAR LIQUIDS FROM MIDNIGHT UNTIL 10:00 AM    CLEAR LIQUID DIET   Foods Allowed                                                                     Foods Excluded  Coffee and tea, regular and decaf                             liquids that you cannot  Plain Jell-O any favor except red or purple                                           see through such as: Fruit ices (not with fruit pulp)                                     milk, soups, orange juice  Iced Popsicles                                    All solid food Carbonated beverages, regular and diet                                    Cranberry, grape and apple juices Sports drinks like Gatorade Lightly seasoned clear broth or consume(fat free) Sugar, honey syrup  Sample Menu Breakfast                                Lunch  Supper Cranberry juice                    Beef broth                            Chicken broth Jell-O                                     Grape juice                            Apple juice Coffee or tea                        Jell-O                                      Popsicle                                                Coffee or tea                        Coffee or tea  _____________________________________________________________________       BRUSH YOUR TEETH THE MORNING OF SURGERY.  TAKE THESE MEDICATIONS MORNING OF SURGERY WITH A SIP OF WATER: LIPITOR   DO NOT WEAR JEWERLY, MAKE UP, OR NAIL POLISH.  DO NOT WEAR LOTIONS, POWDERS, PERFUMES/COLOGNE OR DEODORANT.  DO NOT SHAVE FOR 24 HOURS PRIOR TO DAY OF SURGERY.  MEN MAY SHAVE FACE AND NECK.  CONTACTS, GLASSES, OR DENTURES MAY NOT BE WORN TO SURGERY.                                     IS NOT RESPONSIBLE  FOR ANY BELONGINGS.          BRING ALL PRESCRIPTION MEDICATIONS WITH YOU THE DAY OF SURGERY IN ORIGINAL CONTAINERS                             How to Manage Your Diabetes Before and After Surgery  Why is it important to control my blood sugar before and after surgery? . Improving blood sugar levels before and after surgery helps healing and can limit problems. . A way of improving blood sugar control is eating a healthy diet by: o  Eating less sugar and carbohydrates o  Increasing activity/exercise o  Talking with your doctor about reaching your blood sugar goals . High blood sugars (greater than 180 mg/dL) can raise your risk of infections and slow your recovery, so you will need to focus on controlling your diabetes during the weeks before surgery. . Make sure that the doctor who takes care of your diabetes knows about your planned surgery including the date and location.  How do I manage my blood sugar before surgery? . Check your blood sugar at least 4 times a day, starting 2 days before surgery, to make sure that the level is not too high  or low. o Check your blood sugar the morning of your surgery when you wake up and every 2 hours until you get to the Short Stay  unit. . If your blood sugar is less than 70 mg/dL, you will need to treat for low blood sugar: o Do not take insulin. o Treat a low blood sugar (less than 70 mg/dL) with  cup of clear juice (cranberry or apple), 4 glucose tablets, OR glucose gel. o Recheck blood sugar in 15 minutes after treatment (to make sure it is greater than 70 mg/dL). If your blood sugar is not greater than 70 mg/dL on recheck, call [phone] for further instructions. . Report your blood sugar to the short stay nurse when you get to Short Stay.  . If you are admitted to the hospital after surgery: o Your blood sugar will be checked by the staff and you will probably be given insulin after surgery (instead of oral diabetes medicines) to make sure you have good blood sugar levels. o The goal for blood sugar control after surgery is 80-180 mg/dL.   WHAT DO I DO ABOUT MY DIABETES MEDICATION?  Marland Kitchen Do not take oral diabetes medicines (pills) the morning of surgery.  THE DAY BEFORE SURGERY, take     METFORMIN AS USUAL  THE MORNING OF SURGERY, DO NOT TAKE METFORMIN     .[facility] - Preparing for Surgery Before surgery, you can play an important role.  Because skin is not sterile, your skin needs to be as free of germs as possible.  You can reduce the number of germs on your skin by washing with CHG (chlorahexidine gluconate) soap before surgery.  CHG is an antiseptic cleaner which kills germs and bonds with the skin to continue killing germs even after washing. Please DO NOT use if you have an allergy to CHG or antibacterial soaps.  If your skin becomes reddened/irritated stop using the CHG and inform your nurse when you arrive at Short Stay. Do not shave (including legs and underarms) for at least 48 hours prior to the first CHG shower.  You may shave your face/neck. Please follow these instructions carefully:  1.  Shower with CHG Soap the night before surgery and the  morning of Surgery.  2.  If you choose to wash  your hair, wash your hair first as usual with your  normal  shampoo.  3.  After you shampoo, rinse your hair and body thoroughly to remove the  shampoo.                           4.  Use CHG as you would any other liquid soap.  You can apply chg directly  to the skin and wash                       Gently with a scrungie or clean washcloth.  5.  Apply the CHG Soap to your body ONLY FROM THE NECK DOWN.   Do not use on face/ open                           Wound or open sores. Avoid contact with eyes, ears mouth and genitals (private parts).                       Wash face,  Genitals (private parts) with your normal soap.  6.  Wash thoroughly, paying special attention to the area where your surgery  will be performed.  7.  Thoroughly rinse your body with warm water from the neck down.  8.  DO NOT shower/wash with your normal soap after using and rinsing off  the CHG Soap.                9.  Pat yourself dry with a clean towel.            10.  Wear clean pajamas.            11.  Place clean sheets on your bed the night of your first shower and do not  sleep with pets. Day of Surgery : Do not apply any lotions/deodorants the morning of surgery.  Please wear clean clothes to the hospital/surgery center.  FAILURE TO FOLLOW THESE INSTRUCTIONS MAY RESULT IN THE CANCELLATION OF YOUR SURGERY PATIENT SIGNATURE_________________________________  NURSE SIGNATURE__________________________________  ________________________________________________________________________    Adam Phenix  An incentive spirometer is a tool that can help keep your lungs clear and active. This tool measures how well you are filling your lungs with each breath. Taking long deep breaths may help reverse or decrease the chance of developing breathing (pulmonary) problems (especially infection) following:  A long period of time when you are unable to move or be active. BEFORE THE PROCEDURE   If the spirometer  includes an indicator to show your best effort, your nurse or respiratory therapist will set it to a desired goal.  If possible, sit up straight or lean slightly forward. Try not to slouch.  Hold the incentive spirometer in an upright position. INSTRUCTIONS FOR USE  1. Sit on the edge of your bed if possible, or sit up as far as you can in bed or on a chair. 2. Hold the incentive spirometer in an upright position. 3. Breathe out normally. 4. Place the mouthpiece in your mouth and seal your lips tightly around it. 5. Breathe in slowly and as deeply as possible, raising the piston or the ball toward the top of the column. 6. Hold your breath for 3-5 seconds or for as long as possible. Allow the piston or ball to fall to the bottom of the column. 7. Remove the mouthpiece from your mouth and breathe out normally. 8. Rest for a few seconds and repeat Steps 1 through 7 at least 10 times every 1-2 hours when you are awake. Take your time and take a few normal breaths between deep breaths. 9. The spirometer may include an indicator to show your best effort. Use the indicator as a goal to work toward during each repetition. 10. After each set of 10 deep breaths, practice coughing to be sure your lungs are clear. If you have an incision (the cut made at the time of surgery), support your incision when coughing by placing a pillow or rolled up towels firmly against it. Once you are able to get out of bed, walk around indoors and cough well. You may stop using the incentive spirometer when instructed by your caregiver.  RISKS AND COMPLICATIONS  Take your time so you do not get dizzy or light-headed.  If you are in pain, you may need to take or ask for pain medication before doing incentive spirometry. It is harder to take a deep breath if you are having pain. AFTER USE  Rest and breathe slowly and easily.  It can be helpful to keep track of a log of  your progress. Your caregiver can provide you with a  simple table to help with this. If you are using the spirometer at home, follow these instructions: [place] IF:   You are having difficultly using the spirometer.  You have trouble using the spirometer as often as instructed.  Your pain medication is not giving enough relief while using the spirometer.  You develop fever of 100.5 F (38.1 C) or higher. SEEK IMMEDIATE MEDICAL CARE IF:   You cough up bloody sputum that had not been present before.  You develop fever of 102 F (38.9 C) or greater.  You develop worsening pain at or near the incision site. MAKE SURE YOU:   Understand these instructions.  Will watch your condition.  Will get help right away if you are not doing well or get worse. Document Released: 01/13/2007 Document Revised: 11/25/2011 Document Reviewed: 03/16/2007 [facility] Patient Information 2014 [employer], [place].   ________________________________________________________________________     ________________________________________________________________________

## 2019-10-28 ENCOUNTER — Other Ambulatory Visit: Payer: Self-pay

## 2019-10-28 ENCOUNTER — Encounter (HOSPITAL_COMMUNITY)
Admission: RE | Admit: 2019-10-28 | Discharge: 2019-10-28 | Disposition: A | Discharge status: Home / Self Care | Payer: Medicare HMO | Source: Ambulatory Visit | Attending: Obstetrics & Gynecology | Admitting: Obstetrics & Gynecology

## 2019-10-28 ENCOUNTER — Encounter (HOSPITAL_COMMUNITY): Payer: Self-pay

## 2019-10-28 DIAGNOSIS — Z01818 Encounter for other preprocedural examination: Secondary | ICD-10-CM | POA: Diagnosis not present

## 2019-10-28 HISTORY — DX: Thyrotoxicosis, unspecified without thyrotoxic crisis or storm: E05.90

## 2019-10-28 HISTORY — DX: Malignant (primary) neoplasm, unspecified: C80.1

## 2019-10-28 LAB — COMPREHENSIVE METABOLIC PANEL
ALT: 21 U/L (ref 0–44)
AST: 14 U/L — ABNORMAL LOW (ref 15–41)
Albumin: 4 g/dL (ref 3.5–5.0)
Alkaline Phosphatase: 80 U/L (ref 38–126)
Anion gap: 8 (ref 5–15)
BUN: 13 mg/dL (ref 8–23)
CO2: 26 mmol/L (ref 22–32)
Calcium: 9.9 mg/dL (ref 8.9–10.3)
Chloride: 104 mmol/L (ref 98–111)
Creatinine, Ser: 0.88 mg/dL (ref 0.44–1.00)
GFR calc Af Amer: >60 mL/min (ref >60)
GFR calc non Af Amer: >60 mL/min (ref >60)
Glucose, Bld: 99 mg/dL (ref 70–99)
Potassium: 4.7 mmol/L (ref 3.5–5.1)
Sodium: 138 mmol/L (ref 135–145)
Total Bilirubin: 0.5 mg/dL (ref 0.3–1.2)
Total Protein: 7.7 g/dL (ref 6.5–8.1)

## 2019-10-28 LAB — HEMOGLOBIN A1C
Hgb A1c MFr Bld: 6.6 % — ABNORMAL HIGH (ref 4.8–5.6)
Mean Plasma Glucose: 142.72 mg/dL

## 2019-10-28 LAB — PROTIME-INR
INR: 1.3 — ABNORMAL HIGH (ref 0.8–1.2)
Prothrombin Time: 16.4 seconds — ABNORMAL HIGH (ref 11.4–15.2)

## 2019-10-28 LAB — CBC
HCT: 44.9 % (ref 36.0–46.0)
Hemoglobin: 14.6 g/dL (ref 12.0–15.0)
MCH: 29 pg (ref 26.0–34.0)
MCHC: 32.5 g/dL (ref 30.0–36.0)
MCV: 89.3 fL (ref 80.0–100.0)
Platelets: 235 10*3/uL (ref 150–400)
RBC: 5.03 MIL/uL (ref 3.87–5.11)
RDW: 13.6 % (ref 11.5–15.5)
WBC: 11.4 10*3/uL — ABNORMAL HIGH (ref 4.0–10.5)
nRBC: 0 % (ref 0.0–0.2)

## 2019-10-28 LAB — APTT: aPTT: 60 seconds — ABNORMAL HIGH (ref 24–36)

## 2019-10-28 LAB — HIV ANTIBODY (ROUTINE TESTING W REFLEX): HIV Screen 4th Generation wRfx: NONREACTIVE

## 2019-10-28 LAB — GLUCOSE, CAPILLARY: Glucose-Capillary: 110 mg/dL — ABNORMAL HIGH (ref 70–99)

## 2019-10-28 LAB — ABO/RH: ABO/RH(D): O POS

## 2019-10-28 NOTE — Progress Notes (Addendum)
PCP - Harland Dingwall. LOV: 10/27/19 Cardiologist -   Chest x-ray -  EKG -  Stress Test -  ECHO -  Cardiac Cath -   Sleep Study -  CPAP -   Fasting Blood Sugar -  Checks Blood Sugar _____ times a day  Blood Thinner Instructions:Xarelto will be stop on 10/31/19 as per surgeon's instructions. Aspirin Instructions: Last Dose:  Anesthesia review: Pt. Was advised to stop smoking at least 24 hours before surgery.She verbalized her understanding of this subject.  Patient denies shortness of breath, fever, cough and chest pain at PAT appointment   Patient verbalized understanding of instructions that were given to them at the PAT appointment. Patient was also instructed that they will need to review over the PAT instructions again at home before surgery.

## 2019-10-30 LAB — LIPID PANEL
Chol/HDL Ratio: 3.3 ratio (ref 0.0–4.4)
Cholesterol, Total: 153 mg/dL (ref 100–199)
HDL: 47 mg/dL (ref >39)
LDL Chol Calc (NIH): 82 mg/dL (ref 0–99)
Triglycerides: 135 mg/dL (ref 0–149)
VLDL Cholesterol Cal: 24 mg/dL (ref 5–40)

## 2019-10-30 LAB — LUPUS ANTICOAGULANT
Dilute Viper Venom Time: 93.1 s — ABNORMAL HIGH (ref 0.0–47.0)
PTT Lupus Anticoagulant: 64.2 s — ABNORMAL HIGH (ref 0.0–51.9)
Thrombin Time: 19.8 s (ref 0.0–23.0)
dPT Confirm Ratio: 0.9 Ratio (ref 0.00–1.40)
dPT: 48.5 s (ref 0.0–55.0)

## 2019-10-30 LAB — DRVVT CONFIRM: dRVVT Confirm: 1.7 ratio — ABNORMAL HIGH (ref 0.8–1.2)

## 2019-10-30 LAB — DRVVT MIX: dRVVT Mix: 61.7 s — ABNORMAL HIGH (ref 0.0–40.4)

## 2019-10-30 LAB — PTT-LA MIX: PTT-LA Mix: 55.2 s — ABNORMAL HIGH (ref 0.0–48.9)

## 2019-10-30 LAB — HEPATITIS C ANTIBODY: Hep C Virus Ab: <0.1 s/co ratio (ref 0.0–0.9)

## 2019-10-30 LAB — HEXAGONAL PHASE PHOSPHOLIPID: Hexagonal Phase Phospholipid: 0 s (ref 0–11)

## 2019-10-31 ENCOUNTER — Encounter: Payer: Self-pay | Admitting: Family Medicine

## 2019-10-31 DIAGNOSIS — R760 Raised antibody titer: Secondary | ICD-10-CM

## 2019-10-31 HISTORY — DX: Raised antibody titer: R76.0

## 2019-10-31 NOTE — Progress Notes (Signed)
Please refer her to rheumatology for elevated lupus coagulant. This was done per Dr. Talbert Cage, her hematologist's recommendation from 2018 when her lupus test was also elevated.

## 2019-11-01 ENCOUNTER — Other Ambulatory Visit: Payer: Self-pay | Admitting: Internal Medicine

## 2019-11-01 ENCOUNTER — Other Ambulatory Visit (HOSPITAL_COMMUNITY)
Admission: RE | Admit: 2019-11-01 | Discharge: 2019-11-01 | Disposition: A | Discharge status: Home / Self Care | Payer: Medicare HMO | Source: Ambulatory Visit | Attending: Obstetrics & Gynecology | Admitting: Obstetrics & Gynecology

## 2019-11-01 DIAGNOSIS — Z01812 Encounter for preprocedural laboratory examination: Secondary | ICD-10-CM | POA: Insufficient documentation

## 2019-11-01 DIAGNOSIS — Z20822 Contact with and (suspected) exposure to covid-19: Secondary | ICD-10-CM | POA: Diagnosis not present

## 2019-11-01 DIAGNOSIS — R760 Raised antibody titer: Secondary | ICD-10-CM

## 2019-11-01 LAB — SARS CORONAVIRUS 2 (TAT 6-24 HRS): SARS Coronavirus 2: NEGATIVE

## 2019-11-01 NOTE — Progress Notes (Unsigned)
Please put in referral dx as I am not seeing elevated lupus cogalnt as a Dx

## 2019-11-01 NOTE — Progress Notes (Signed)
   Subjective:    Patient ID: Rhonda Hudson, female    DOB: 1956/03/28, 64 y.o.   MRN: [id number]  HPI    Review of Systems     Objective:   Physical Exam        Assessment & Plan:

## 2019-11-03 NOTE — H&P (Addendum)
Rhonda Hudson is an 64 y.o. female  64 yo single female. Here for daVinci assisted total laparoscopic hysterectomy for recurrent postmenopausal bleeding with endometrial biopsy noting "endometrial hyperplasia without atypia". I had a consult with GynOnc who advised I could proceed with intervention and they didn't need to perform this hysterectomy.  She had D&C/ Hysteroscopy in 2020 that was normal path but came to our practice as bleeding continued off and on.  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 We worked with PCP to address that, she is off Xarelto 2 days and will be back on it after 2 days post-op. She declined less invasive options of Megace or Mirena IUD and assesss with ultrasound and endometrial biopsy.    Home situation is sensitive with son with drug abuse and she declines any narcotic pain meds Rx. Her friend will bring her home and check on her.  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               DM II (Dr Loanne Drilling, A1C 6.3 in Oct'20), Thyroid nodule              Smoker, working on quitting              Hyperlipidemia, on a statin  Recent Pap- normal. HPV neg. NO breast complaints, declines mammogram (last 2017) and declines Colonoscopy (last 10 yrs back)  Past Medical History:  Diagnosis Date  . Cancer ([place])    skin Ca on phase and back  . Cataracts, bilateral   . Chronic anticoagulation 07/15/2017  . Diabetes mellitus without complication ([place])   . Diverticulitis   . DVT (deep venous thrombosis) ([place])   . DVT of lower extremity (deep venous thrombosis) ([place]) 01/04/2016  . 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)   . Hiatal hernia with gastroesophageal reflux    disease  . History of prediabetes   . Hyperthyroidism   . Insomnia   . Irritable bowel syndrome   . 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 Panic attacks    Hx of  . Thickened endometrium    . Thyroid nodule 03/03/2019    Past Surgical History:  Procedure Laterality Date  . CHOLECYSTECTOMY    . DILATION AND CURETTAGE OF UTERUS N/A 06/16/2018   Procedure: DILATATION AND CURETTAGE;  Surgeon: Emily Filbert, MD;  Location: [facility];  Service: Gynecology;  Laterality: N/A;  . TOE SURGERY    . TUBAL LIGATION    . US ECHOCARDIOGRAPHY  01-31-2009   EF 60%    Family History  Problem Relation Age of Onset  . Diabetes Mother   . Leukemia Father   . Leukemia Sister   . Diabetes Sister   . Leukemia Brother   . Multiple sclerosis Neg Hx   . Autoimmune disease Neg Hx   . Thyroid disease Neg Hx     Social History:  reports that she has been smoking cigarettes. She has been smoking about 0.50 packs per day. She has never used smokeless tobacco. She reports that she does not drink alcohol or use drugs.  Allergies: No Known Allergies  No medications prior to admission.    Review of Systems vaginal bleeding. No SOB/ CP/ HAs   Physical Exam BP (!) 141/77   Pulse 78   Temp (!) 97.3 F (36.3 C) (Oral)   Resp 16   Ht  5\' 8"  (1.727 m)   Wt 97.4 kg   SpO2 98%   BMI 32.66 kg/m   Physical exam:  A&O x 3, no acute distress. Pleasant HEENT neg, no thyromegaly Lungs CTA bilat CV RRR, S1S2 normal Abdo soft, non tender, non acute Extr no edema/ tenderness Pelvic normal uterus and adnexa, normal cervix.    Assessment/Plan: Pt assessed in Pre-op and H&P and plan reviewed.   64 yo female with long standing postmenopausal bleeding with Endometrial Hyperplasia without atypia. Declines medical therapy with Megace or Mirena. Desires hysterectomy as she is tired of bleeding.  Planning daVinci assisted total laparoscopic hysterectomy and bilateral salpingoophorectomy and pelvic washings.   Risks/complications of surgery reviewed incl infection, bleeding (esp bleeding in surgery and post-op with anticoagulation in 48 hrs), damage to internal organs including bladder,  bowels, ureters, blood vessels, other risks from anesthesia, VTE (esp with known increased VTE risk) and delayed complications of any surgery, complications in future surgery reviewed.    Plan to restart Xarelto on 2/20 night. Will receive Lovenox prophylaxis after surgery and have SCDs while in bed BS monitoring  Elveria Royals

## 2019-11-03 NOTE — Progress Notes (Signed)
Called and spoke w/ pt via phone about surgery start times were being change due to inclement weather.  Informed her surgery start time is 1300 and she is to arrive at 1100 and be npo after mn w/ exception clear liquids until 0800 (not 1000) then nothing by mouth exception sips of water with medication, per verbalized understanding.  Also, pt given [facility] front desk phone number in case she is unable to make it in to surgery tomorrow (11-04-2019)

## 2019-11-04 ENCOUNTER — Other Ambulatory Visit: Payer: Self-pay

## 2019-11-04 ENCOUNTER — Encounter (HOSPITAL_BASED_OUTPATIENT_CLINIC_OR_DEPARTMENT_OTHER)
Admission: AD | Disposition: A | Discharge status: Home / Self Care | Payer: Self-pay | Source: Home / Self Care | Attending: Obstetrics & Gynecology

## 2019-11-04 ENCOUNTER — Ambulatory Visit (HOSPITAL_BASED_OUTPATIENT_CLINIC_OR_DEPARTMENT_OTHER): Payer: Medicare HMO | Admitting: Anesthesiology

## 2019-11-04 ENCOUNTER — Encounter (HOSPITAL_BASED_OUTPATIENT_CLINIC_OR_DEPARTMENT_OTHER): Payer: Self-pay | Admitting: Obstetrics & Gynecology

## 2019-11-04 ENCOUNTER — Ambulatory Visit (HOSPITAL_BASED_OUTPATIENT_CLINIC_OR_DEPARTMENT_OTHER): Payer: Medicare HMO | Admitting: Physician Assistant

## 2019-11-04 ENCOUNTER — Observation Stay (HOSPITAL_BASED_OUTPATIENT_CLINIC_OR_DEPARTMENT_OTHER)
Admission: AD | Admit: 2019-11-04 | Discharge: 2019-11-05 | Disposition: A | Discharge status: Home / Self Care | Payer: Medicare HMO | Attending: Obstetrics & Gynecology | Admitting: Obstetrics & Gynecology

## 2019-11-04 DIAGNOSIS — Z7901 Long term (current) use of anticoagulants: Secondary | ICD-10-CM | POA: Insufficient documentation

## 2019-11-04 DIAGNOSIS — D682 Hereditary deficiency of other clotting factors: Secondary | ICD-10-CM | POA: Diagnosis not present

## 2019-11-04 DIAGNOSIS — Z85828 Personal history of other malignant neoplasm of skin: Secondary | ICD-10-CM | POA: Diagnosis not present

## 2019-11-04 DIAGNOSIS — D251 Intramural leiomyoma of uterus: Secondary | ICD-10-CM | POA: Diagnosis not present

## 2019-11-04 DIAGNOSIS — Z7984 Long term (current) use of oral hypoglycemic drugs: Secondary | ICD-10-CM | POA: Diagnosis not present

## 2019-11-04 DIAGNOSIS — Z833 Family history of diabetes mellitus: Secondary | ICD-10-CM | POA: Insufficient documentation

## 2019-11-04 DIAGNOSIS — N83292 Other ovarian cyst, left side: Secondary | ICD-10-CM | POA: Diagnosis not present

## 2019-11-04 DIAGNOSIS — N950 Postmenopausal bleeding: Secondary | ICD-10-CM | POA: Insufficient documentation

## 2019-11-04 DIAGNOSIS — N83291 Other ovarian cyst, right side: Secondary | ICD-10-CM | POA: Diagnosis not present

## 2019-11-04 DIAGNOSIS — N8500 Endometrial hyperplasia, unspecified: Secondary | ICD-10-CM | POA: Diagnosis not present

## 2019-11-04 DIAGNOSIS — N736 Female pelvic peritoneal adhesions (postinfective): Secondary | ICD-10-CM | POA: Diagnosis not present

## 2019-11-04 DIAGNOSIS — Z90710 Acquired absence of both cervix and uterus: Secondary | ICD-10-CM | POA: Diagnosis present

## 2019-11-04 DIAGNOSIS — E119 Type 2 diabetes mellitus without complications: Secondary | ICD-10-CM | POA: Insufficient documentation

## 2019-11-04 DIAGNOSIS — N838 Other noninflammatory disorders of ovary, fallopian tube and broad ligament: Secondary | ICD-10-CM | POA: Insufficient documentation

## 2019-11-04 DIAGNOSIS — F17210 Nicotine dependence, cigarettes, uncomplicated: Secondary | ICD-10-CM | POA: Insufficient documentation

## 2019-11-04 DIAGNOSIS — Z86718 Personal history of other venous thrombosis and embolism: Secondary | ICD-10-CM | POA: Diagnosis not present

## 2019-11-04 DIAGNOSIS — E785 Hyperlipidemia, unspecified: Secondary | ICD-10-CM | POA: Diagnosis not present

## 2019-11-04 DIAGNOSIS — N8501 Benign endometrial hyperplasia: Principal | ICD-10-CM | POA: Insufficient documentation

## 2019-11-04 HISTORY — PX: CYSTOSCOPY: SHX5120

## 2019-11-04 HISTORY — PX: ROBOTIC ASSISTED TOTAL HYSTERECTOMY WITH BILATERAL SALPINGO OOPHERECTOMY: SHX6086

## 2019-11-04 LAB — GLUCOSE, CAPILLARY
Glucose-Capillary: 107 mg/dL — ABNORMAL HIGH (ref 70–99)
Glucose-Capillary: 163 mg/dL — ABNORMAL HIGH (ref 70–99)
Glucose-Capillary: 165 mg/dL — ABNORMAL HIGH (ref 70–99)
Glucose-Capillary: 192 mg/dL — ABNORMAL HIGH (ref 70–99)

## 2019-11-04 LAB — TYPE AND SCREEN
ABO/RH(D): O POS
Antibody Screen: NEGATIVE

## 2019-11-04 SURGERY — HYSTERECTOMY, TOTAL, ROBOT-ASSISTED, LAPAROSCOPIC, WITH BILATERAL SALPINGO-OOPHORECTOMY
Anesthesia: General | Laterality: Bilateral

## 2019-11-04 MED ORDER — PROPOFOL 10 MG/ML IV BOLUS
INTRAVENOUS | Status: DC | PRN
  Administered 2019-11-04: 200 mg via INTRAVENOUS

## 2019-11-04 MED ORDER — SUGAMMADEX SODIUM 200 MG/2ML IV SOLN
INTRAVENOUS | Status: DC | PRN
  Administered 2019-11-04: 200 mg via INTRAVENOUS

## 2019-11-04 MED ORDER — DEXAMETHASONE SODIUM PHOSPHATE 10 MG/ML IJ SOLN
INTRAMUSCULAR | Status: AC
  Filled 2019-11-04: qty 1

## 2019-11-04 MED ORDER — ONDANSETRON HCL 4 MG/2ML IJ SOLN
4.0000 mg | Freq: Once | INTRAMUSCULAR | Status: DC | PRN
  Filled 2019-11-04: qty 2

## 2019-11-04 MED ORDER — ACETAMINOPHEN 325 MG PO TABS
ORAL_TABLET | ORAL | Status: AC
  Filled 2019-11-04: qty 2

## 2019-11-04 MED ORDER — KETOROLAC TROMETHAMINE 15 MG/ML IJ SOLN
15.0000 mg | Freq: Once | INTRAMUSCULAR | Status: DC | PRN
  Filled 2019-11-04: qty 1

## 2019-11-04 MED ORDER — LIDOCAINE HCL 2 % IJ SOLN
INTRAMUSCULAR | Status: AC
  Filled 2019-11-04: qty 20

## 2019-11-04 MED ORDER — ONDANSETRON HCL 4 MG PO TABS
4.0000 mg | ORAL_TABLET | Freq: Four times a day (QID) | ORAL | Status: DC | PRN
  Filled 2019-11-04: qty 1

## 2019-11-04 MED ORDER — ATORVASTATIN CALCIUM 10 MG PO TABS
10.0000 mg | ORAL_TABLET | Freq: Every day | ORAL | Status: DC
  Filled 2019-11-04: qty 1

## 2019-11-04 MED ORDER — ACETAMINOPHEN 500 MG PO TABS
1000.0000 mg | ORAL_TABLET | Freq: Four times a day (QID) | ORAL | Status: DC
  Administered 2019-11-04 – 2019-11-05 (×3): 1000 mg via ORAL
  Filled 2019-11-04: qty 2

## 2019-11-04 MED ORDER — METFORMIN HCL 500 MG PO TABS
500.0000 mg | ORAL_TABLET | Freq: Two times a day (BID) | ORAL | Status: DC
  Administered 2019-11-04 – 2019-11-05 (×2): 500 mg via ORAL
  Filled 2019-11-04: qty 1

## 2019-11-04 MED ORDER — OXYCODONE HCL 5 MG PO TABS
ORAL_TABLET | ORAL | Status: AC
  Filled 2019-11-04: qty 2

## 2019-11-04 MED ORDER — SODIUM CHLORIDE 0.9 % IV SOLN
INTRAVENOUS | Status: DC | PRN
  Administered 2019-11-04: 60 mL

## 2019-11-04 MED ORDER — ONDANSETRON HCL 4 MG/2ML IJ SOLN
INTRAMUSCULAR | Status: AC
  Filled 2019-11-04: qty 2

## 2019-11-04 MED ORDER — EPHEDRINE SULFATE 50 MG/ML IJ SOLN
INTRAMUSCULAR | Status: DC | PRN
  Administered 2019-11-04: 15 mg via INTRAVENOUS

## 2019-11-04 MED ORDER — KETAMINE HCL 10 MG/ML IJ SOLN
INTRAMUSCULAR | Status: DC | PRN
  Administered 2019-11-04: 40 mg via INTRAVENOUS

## 2019-11-04 MED ORDER — LIDOCAINE 2% (20 MG/ML) 5 ML SYRINGE
INTRAMUSCULAR | Status: DC | PRN
  Administered 2019-11-04: 1.5 mg/kg/h via INTRAVENOUS
  Administered 2019-11-04: 60 mg via INTRAVENOUS

## 2019-11-04 MED ORDER — ENOXAPARIN SODIUM 40 MG/0.4ML ~~LOC~~ SOLN
40.0000 mg | SUBCUTANEOUS | Status: DC
  Administered 2019-11-05: 40 mg via SUBCUTANEOUS
  Filled 2019-11-04: qty 0.4

## 2019-11-04 MED ORDER — CEFAZOLIN SODIUM-DEXTROSE 2-4 GM/100ML-% IV SOLN
2.0000 g | INTRAVENOUS | Status: AC
  Administered 2019-11-04: 2 g via INTRAVENOUS
  Filled 2019-11-04: qty 100

## 2019-11-04 MED ORDER — FAMOTIDINE 20 MG PO TABS
20.0000 mg | ORAL_TABLET | Freq: Once | ORAL | Status: AC
  Administered 2019-11-04: 20 mg via ORAL
  Filled 2019-11-04: qty 1

## 2019-11-04 MED ORDER — ACETAMINOPHEN 500 MG PO TABS
ORAL_TABLET | ORAL | Status: AC
  Filled 2019-11-04: qty 2

## 2019-11-04 MED ORDER — ACETAMINOPHEN 500 MG PO TABS
1000.0000 mg | ORAL_TABLET | Freq: Once | ORAL | Status: AC
  Administered 2019-11-04: 1000 mg via ORAL
  Filled 2019-11-04: qty 2

## 2019-11-04 MED ORDER — CEFAZOLIN SODIUM-DEXTROSE 2-4 GM/100ML-% IV SOLN
INTRAVENOUS | Status: AC
  Filled 2019-11-04: qty 100

## 2019-11-04 MED ORDER — KETAMINE HCL 10 MG/ML IJ SOLN
INTRAMUSCULAR | Status: AC
  Filled 2019-11-04: qty 1

## 2019-11-04 MED ORDER — ROCURONIUM BROMIDE 10 MG/ML (PF) SYRINGE
PREFILLED_SYRINGE | INTRAVENOUS | Status: DC | PRN
  Administered 2019-11-04: 60 mg via INTRAVENOUS

## 2019-11-04 MED ORDER — DEXAMETHASONE SODIUM PHOSPHATE 10 MG/ML IJ SOLN
INTRAMUSCULAR | Status: DC | PRN
  Administered 2019-11-04: 10 mg via INTRAVENOUS

## 2019-11-04 MED ORDER — ONDANSETRON HCL 4 MG/2ML IJ SOLN
INTRAMUSCULAR | Status: DC | PRN
  Administered 2019-11-04: 4 mg via INTRAVENOUS

## 2019-11-04 MED ORDER — FENTANYL CITRATE (PF) 100 MCG/2ML IJ SOLN
INTRAMUSCULAR | Status: AC
  Filled 2019-11-04: qty 2

## 2019-11-04 MED ORDER — ROCURONIUM BROMIDE 10 MG/ML (PF) SYRINGE
PREFILLED_SYRINGE | INTRAVENOUS | Status: AC
  Filled 2019-11-04: qty 10

## 2019-11-04 MED ORDER — FENTANYL CITRATE (PF) 100 MCG/2ML IJ SOLN
INTRAMUSCULAR | Status: DC | PRN
  Administered 2019-11-04 (×3): 50 ug via INTRAVENOUS
  Administered 2019-11-04: 100 ug via INTRAVENOUS

## 2019-11-04 MED ORDER — LIDOCAINE 2% (20 MG/ML) 5 ML SYRINGE
INTRAMUSCULAR | Status: AC
  Filled 2019-11-04: qty 5

## 2019-11-04 MED ORDER — ONDANSETRON HCL 4 MG/2ML IJ SOLN
4.0000 mg | Freq: Four times a day (QID) | INTRAMUSCULAR | Status: DC | PRN
  Filled 2019-11-04: qty 2

## 2019-11-04 MED ORDER — PROPOFOL 10 MG/ML IV BOLUS
INTRAVENOUS | Status: AC
  Filled 2019-11-04: qty 20

## 2019-11-04 MED ORDER — FENTANYL CITRATE (PF) 250 MCG/5ML IJ SOLN
INTRAMUSCULAR | Status: AC
  Filled 2019-11-04: qty 5

## 2019-11-04 MED ORDER — MIDAZOLAM HCL 2 MG/2ML IJ SOLN
INTRAMUSCULAR | Status: AC
  Filled 2019-11-04: qty 2

## 2019-11-04 MED ORDER — MIDAZOLAM HCL 2 MG/2ML IJ SOLN
INTRAMUSCULAR | Status: DC | PRN
  Administered 2019-11-04: 2 mg via INTRAVENOUS

## 2019-11-04 MED ORDER — FAMOTIDINE 20 MG PO TABS
ORAL_TABLET | ORAL | Status: AC
  Filled 2019-11-04: qty 1

## 2019-11-04 MED ORDER — MENTHOL 3 MG MT LOZG
1.0000 | LOZENGE | OROMUCOSAL | Status: DC | PRN
  Filled 2019-11-04: qty 9

## 2019-11-04 MED ORDER — FENTANYL CITRATE (PF) 100 MCG/2ML IJ SOLN
25.0000 ug | INTRAMUSCULAR | Status: DC | PRN
  Administered 2019-11-04 (×2): 25 ug via INTRAVENOUS
  Filled 2019-11-04: qty 1

## 2019-11-04 MED ORDER — SODIUM CHLORIDE 0.9 % IV SOLN
INTRAVENOUS | Status: DC
  Filled 2019-11-04: qty 1000

## 2019-11-04 MED ORDER — HYDROMORPHONE HCL 2 MG PO TABS
1.0000 mg | ORAL_TABLET | ORAL | Status: DC | PRN
  Filled 2019-11-04: qty 1

## 2019-11-04 MED ORDER — OXYCODONE HCL 5 MG PO TABS
10.0000 mg | ORAL_TABLET | ORAL | Status: DC | PRN
  Administered 2019-11-04 – 2019-11-05 (×4): 10 mg via ORAL
  Filled 2019-11-04: qty 2

## 2019-11-04 MED ORDER — LACTATED RINGERS IV SOLN
INTRAVENOUS | Status: DC
  Filled 2019-11-04 (×2): qty 1000

## 2019-11-04 MED ORDER — SODIUM CHLORIDE 0.9 % IR SOLN
Status: DC | PRN
  Administered 2019-11-04: 3000 mL

## 2019-11-04 MED ORDER — GLYCOPYRROLATE PF 0.2 MG/ML IJ SOSY
PREFILLED_SYRINGE | INTRAMUSCULAR | Status: AC
  Filled 2019-11-04: qty 1

## 2019-11-04 SURGICAL SUPPLY — 61 items
ADH SKN CLS APL DERMABOND .7 (GAUZE/BANDAGES/DRESSINGS) ×2
APL SRG 38 LTWT LNG FL B (MISCELLANEOUS)
APPLICATOR ARISTA FLEXITIP XL (MISCELLANEOUS) IMPLANT
BARRIER ADHS 3X4 INTERCEED (GAUZE/BANDAGES/DRESSINGS) IMPLANT
BRR ADH 4X3 ABS CNTRL BYND (GAUZE/BANDAGES/DRESSINGS)
CATH FOLEY 3WAY  5CC 16FR (CATHETERS) ×2
CATH FOLEY 3WAY 5CC 16FR (CATHETERS) ×2 IMPLANT
COVER BACK TABLE 60X90IN (DRAPES) ×4 IMPLANT
COVER TIP SHEARS 8 DVNC (MISCELLANEOUS) ×2 IMPLANT
COVER TIP SHEARS 8MM DA VINCI (MISCELLANEOUS) ×2
DECANTER SPIKE VIAL GLASS SM (MISCELLANEOUS) ×8 IMPLANT
DEFOGGER SCOPE WARMER CLEARIFY (MISCELLANEOUS) ×4 IMPLANT
DERMABOND ADVANCED (GAUZE/BANDAGES/DRESSINGS) ×2
DERMABOND ADVANCED .7 DNX12 (GAUZE/BANDAGES/DRESSINGS) ×2 IMPLANT
DRAPE ARM DVNC X/XI (DISPOSABLE) ×8 IMPLANT
DRAPE COLUMN DVNC XI (DISPOSABLE) ×2 IMPLANT
DRAPE DA VINCI XI ARM (DISPOSABLE) ×8
DRAPE DA VINCI XI COLUMN (DISPOSABLE) ×2
DURAPREP 26ML APPLICATOR (WOUND CARE) ×4 IMPLANT
ELECT REM PT RETURN 9FT ADLT (ELECTROSURGICAL) ×4
ELECTRODE REM PT RTRN 9FT ADLT (ELECTROSURGICAL) ×2 IMPLANT
GAUZE 4X4 16PLY RFD (DISPOSABLE) ×4 IMPLANT
GAUZE PETROLATUM 1 X8 (GAUZE/BANDAGES/DRESSINGS) IMPLANT
GLOVE BIO SURGEON STRL SZ 6.5 (GLOVE) IMPLANT
GLOVE BIO SURGEON STRL SZ7 (GLOVE) ×12 IMPLANT
GLOVE BIO SURGEONS STRL SZ 6.5 (GLOVE)
GLOVE BIOGEL PI IND STRL 7.0 (GLOVE) ×10 IMPLANT
GLOVE BIOGEL PI INDICATOR 7.0 (GLOVE) ×10
HEMOSTAT ARISTA ABSORB 3G PWDR (HEMOSTASIS) IMPLANT
IRRIG SUCT STRYKERFLOW 2 WTIP (MISCELLANEOUS) ×4
IRRIGATION SUCT STRKRFLW 2 WTP (MISCELLANEOUS) ×2 IMPLANT
LEGGING LITHOTOMY PAIR STRL (DRAPES) ×4 IMPLANT
OBTURATOR OPTICAL STANDARD 8MM (TROCAR) ×2
OBTURATOR OPTICAL STND 8 DVNC (TROCAR) ×2
OBTURATOR OPTICALSTD 8 DVNC (TROCAR) ×2 IMPLANT
OCCLUDER COLPOPNEUMO (BALLOONS) ×4 IMPLANT
PACK ROBOT WH (CUSTOM PROCEDURE TRAY) ×4 IMPLANT
PACK ROBOTIC GOWN (GOWN DISPOSABLE) ×4 IMPLANT
PACK TRENDGUARD 450 HYBRID PRO (MISCELLANEOUS) ×2 IMPLANT
PAD PREP 24X48 CUFFED NSTRL (MISCELLANEOUS) ×4 IMPLANT
PROTECTOR NERVE ULNAR (MISCELLANEOUS) ×4 IMPLANT
SEAL CANN UNIV 5-8 DVNC XI (MISCELLANEOUS) ×6 IMPLANT
SEAL XI 5MM-8MM UNIVERSAL (MISCELLANEOUS) ×6
SEALER VESSEL DA VINCI XI (MISCELLANEOUS)
SEALER VESSEL EXT DVNC XI (MISCELLANEOUS) IMPLANT
SET IRRIG Y TYPE TUR BLADDER L (SET/KITS/TRAYS/PACK) IMPLANT
SET TRI-LUMEN FLTR TB AIRSEAL (TUBING) ×4 IMPLANT
SUT VIC AB 0 SH 27 (SUTURE) ×4 IMPLANT
SUT VICRYL 0 UR6 27IN ABS (SUTURE) ×4 IMPLANT
SUT VICRYL 4-0 PS2 18IN ABS (SUTURE) ×8 IMPLANT
SUT VLOC 180 0 9IN  GS21 (SUTURE) ×2
SUT VLOC 180 0 9IN GS21 (SUTURE) ×2 IMPLANT
TIP RUMI ORANGE 6.7MMX12CM (TIP) IMPLANT
TIP UTERINE 5.1X6CM LAV DISP (MISCELLANEOUS) IMPLANT
TIP UTERINE 6.7X10CM GRN DISP (MISCELLANEOUS) IMPLANT
TIP UTERINE 6.7X6CM WHT DISP (MISCELLANEOUS) IMPLANT
TIP UTERINE 6.7X8CM BLUE DISP (MISCELLANEOUS) IMPLANT
TOWEL OR 17X26 10 PK STRL BLUE (TOWEL DISPOSABLE) ×4 IMPLANT
TRENDGUARD 450 HYBRID PRO PACK (MISCELLANEOUS) ×4
TROCAR PORT AIRSEAL 5X120 (TROCAR) ×4 IMPLANT
WATER STERILE IRR 1000ML POUR (IV SOLUTION) ×4 IMPLANT

## 2019-11-04 NOTE — Transfer of Care (Signed)
Immediate Anesthesia Transfer of Care Note  Patient: Rhonda Hudson  Procedure(s) Performed: XI ROBOTIC ASSISTED TOTAL HYSTERECTOMY WITH BILATERAL SALPINGO OOPHORECTOMY/Pelvic Washings (Bilateral ) CYSTOSCOPY  Patient Location: PACU  Anesthesia Type:General  Level of Consciousness: awake, alert , oriented and patient cooperative  Airway & Oxygen Therapy: Patient Spontanous Breathing and Patient connected to face mask oxygen  Post-op Assessment: Report given to RN and Post -op Vital signs reviewed and stable  Post vital signs: Reviewed and stable  Last Vitals:  Vitals Value Taken Time  BP    Temp    Pulse    Resp    SpO2      Last Pain:  Vitals:   11/04/19 1007  TempSrc: Oral      Patients Stated Pain Goal: 3 ([id number])  Complications: No apparent anesthesia complications

## 2019-11-04 NOTE — Anesthesia Preprocedure Evaluation (Addendum)
Anesthesia Evaluation  Patient identified by MRN, date of birth, ID band Patient awake    Reviewed: Allergy & Precautions, NPO status , Patient's Chart, lab work & pertinent test results  Airway Mallampati: III  TM Distance: >3 FB Neck ROM: Full    Dental  (+) Missing, Poor Dentition,    Pulmonary Current Smoker and Patient abstained from smoking.,    Pulmonary exam normal breath sounds clear to auscultation       Cardiovascular + DVT  Normal cardiovascular exam Rhythm:Regular Rate:Normal  ECG: NSR, rate 71   Neuro/Psych PSYCHIATRIC DISORDERS Anxiety Depression negative neurological ROS     GI/Hepatic Neg liver ROS, hiatal hernia, GERD  Medicated and Controlled,Irritable bowel syndrome   Endo/Other  diabetes, Type 2, Oral Hypoglycemic Agents  Renal/GU negative Renal ROS     Musculoskeletal  (+) Arthritis ,   Abdominal (+) + obese,   Peds  Hematology HLD Lupus anticoagulant positive Factor II deficiency   Anesthesia Other Findings Endometrial Hyperplasia Without Atypia  Reproductive/Obstetrics                          Anesthesia Physical Anesthesia Plan  ASA: III  Anesthesia Plan: General   Post-op Pain Management:    Induction: Intravenous  PONV Risk Score and Plan: 3 and Ondansetron, Dexamethasone, Midazolam and Treatment may vary due to age or medical condition  Airway Management Planned: Oral ETT  Additional Equipment:   Intra-op Plan:   Post-operative Plan: Extubation in OR  Informed Consent: I have reviewed the patients History and Physical, chart, labs and discussed the procedure including the risks, benefits and alternatives for the proposed anesthesia with the patient or authorized representative who has indicated his/her understanding and acceptance.     Dental advisory given  Plan Discussed with: CRNA  Anesthesia Plan Comments:        Anesthesia Quick  Evaluation

## 2019-11-04 NOTE — Anesthesia Postprocedure Evaluation (Signed)
Anesthesia Post Note  Patient: Rhonda Hudson  Procedure(s) Performed: XI ROBOTIC ASSISTED TOTAL HYSTERECTOMY WITH BILATERAL SALPINGO OOPHORECTOMY/Pelvic Washings (Bilateral ) CYSTOSCOPY     Patient location during evaluation: PACU Anesthesia Type: General Level of consciousness: awake and alert and oriented Pain management: pain level controlled Vital Signs Assessment: post-procedure vital signs reviewed and stable Respiratory status: spontaneous breathing, nonlabored ventilation and respiratory function stable Cardiovascular status: blood pressure returned to baseline Postop Assessment: no apparent nausea or vomiting Anesthetic complications: no    Last Vitals:  Vitals:   11/04/19 1600 11/04/19 1606  BP: (!) 151/76   Pulse: 78 78  Resp: 17 18  Temp:    SpO2: 90% 95%    Last Pain:  Vitals:   11/04/19 1530  TempSrc:   PainSc: [place] E Jaana Brodt

## 2019-11-04 NOTE — Anesthesia Procedure Notes (Signed)
Procedure Name: Intubation Date/Time: 11/04/2019 11:31 AM Performed by: Wanita Chamberlain, CRNA Pre-anesthesia Checklist: Patient identified, Emergency Drugs available, Suction available and Patient being monitored Patient Re-evaluated:Patient Re-evaluated prior to induction Oxygen Delivery Method: Circle system utilized Preoxygenation: Pre-oxygenation with 100% oxygen Induction Type: IV induction Ventilation: Mask ventilation without difficulty Laryngoscope Size: Mac and 3 Grade View: Grade II Tube type: Oral Tube size: 7.0 mm Number of attempts: 1 Placement Confirmation: breath sounds checked- equal and bilateral,  CO2 detector,  positive ETCO2 and ETT inserted through vocal cords under direct vision Secured at: 21 cm Tube secured with: Tape Dental Injury: Teeth and Oropharynx as per pre-operative assessment

## 2019-11-04 NOTE — Op Note (Addendum)
Preoperative diagnosis:   Complex endometrial hyperplasia without atypia. Recurrent postmenopausal bleeding  Postop diagnosis: Same Procedure: da Vinci robot assisted total laparoscopic hysterectomy and bilateral salpingoophorectomy, Pelvic washings                    Diagnostic Cystoscopy   Anesthesia Gen. Endotracheal Surgeon: Dr. Azucena Fallen Assistant:Tracie Janann Colonel, RNFA IV fluids: 1600 cc LR EBL: 25cc Urine output: [id number] cc, clear Complications: none Pathology: Uterus with cervix and both ovaries and fallopian tubes, pelvic washings  Disposition: PACU, stable Findings: Bulky uterus, small ovaries and fallopian tubes with evidence of partial salpingectomy. Large bowel adhesions in left lower pelvis and right mid abdomen. Ureters difficult to identify entire course at laparoscopy but bilateral normal spontaneous ureteral jets noted at cystoscopy  Procedure:  Indication: --64 yo female with recurrent postmenopausal bleeding. Office endometrial biopsy noted complex endometrial hyperplasia without atypia. GynOnc consult obtained and I was advised to proceed with surgery. Patient was given medical option of Mirena or Megace and close follow up but she declined. 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  Complications of surgery including infection, bleeding, damage to internal organs and other surgery related problems including pneumonia, VTE reviewed and informed written consent was obtained. Also reviewed risks stopping anticoagulation and risks of anticoagulation post-operatively. She understood and gave informed written consent.   Patient was brought to the operating room with IV running. She received 2 gm Ancef . Underwent general anesthesia without difficulty and was given dorsal lithotomy position, prepped and draped in sterile fashion. Foley catheter was placed. Cervix was exposed with a speculum and anterior lip of the cervix was grasped with tenaculum.  Uterus was sounded to 9 cm. A  # 8 Rumi tip and a small Koh ring was assembled on the [employer] and entered in the uterine cavity and balloon was inflated to secure it in place. Koh ring was palpated again cervico-vaginal junction. Speculum was removed, tenaculum was left on the cervix.  Attention was focused on abdomen. Upper umbilical 12 mm vertical incision made with scalpel after injecting Ropivacaine and fascia dissected, grasped with Kocher's and incised, posterior rectus sheath and peritoneum grasped, incised, intraabdominal entry confirmed. Purse string stay stitch on 0-Vicryl taken on fascia and Robotic cannula with occluder introduced and Vicryl sutures secured. Pneumoperitoneum was begun. Laparoscope was introduced and the peritoneal cavity was evaluated. Right mid to upper abdomen had bowel adhesions. Dr Johney Maine (Gen Surgeon) was next door and came to inspect. Patient has no bowel obstruction symptoms, this appeared to be right upper colon and he advised no intervention for asymptomatic patient except cut one filmy band which I did with cold scissors.  Port sited marked and injected with Ropivacaine. One Robotic cannula inserted on right side and one on the left side under vision and Flowseal inserted on the left as well. Robot docked from the right. There was some problem with table height being to high and it could not calibrate the arms, but we were able to override and proceed safely with docking all arms. Dr. Benjie Karvonen scrubbed out and went for surgical console. Arm 1 had vessel sealer, arm 2 laparoscope, arm 3 scissors.  Pelvic washings obtained. Left colonic pelvic adhesions noted and bowel was not able to be rolled back out of the pelvis. I was able to expose left IP ligament well after dissecting colonic adhesions to lateral pelvic wall. Uterus, ovaries, tubes evaluated, left ureter was not seen well  at the brim due to colon adhesions and right was only partially seen as she I didn't want her  in maximum trendelenburg due to ventilation concerns.    Uterus was deviated to the patient's left. The right IP ligament was desiccated and cut, followed by broad ligament and then the right Round ligament which was desiccated and cut. Anterior bladder broad ligament was opened and incised. Posterior broad ligament incised up to the uterosacral ligament and right uterine vessels skeletonized. Uterus was deviated to the right and left colon adhesions excised with cold scissors and then left IP was exposed, desiccated and incised followed by broad ligament and left Round ligament. Anterior broad ligament was incised to create bladder flap, bladder was pushed away by blunt and sharp dissection with excellent hemostasis. Koh ring impression at cervicovaginal junction was seen well anteriorly. Right posterior broad ligament dissected, right Uterine vessels skeletonized. Right uterine vessels were desiccated and cut. Uterus was deviated to the right and the left uterine vessels were desiccated and cut. Anteriorly bladder seems fuller through out surgery and foley was checked several times be tugged well and draining clear using well. With blunt and sharp dissection, bladder was further dissected. Vaginal occluder was inflated. Colpotomy was begun starting from midline anteriorly coming to the left and right and then circumferentially staying above the uterosacral ligaments posteriorly. Uterus, cervix, both tubes and ovaries were pulled out of the vaginal opening and vaginal occluder placed back in vagina to maintain pneumoperitoneum.  Vaginal cut edges were evaluated. Right side noted more bleeding. Vaginal walls didn't have much give due to menopausal status and were more friable. Bleeding at raw edges were cauterized carefully. Uterine pedicles were hemostatic. Irrigation was performed pedicles appeared reasonably dry. Robotic instruments switched for needle driver and long tip tissue forceps. Vee-Lock 0 used for  suturing vaginal cuff from right to left and back to the right. Right side vaginal edges were bleeding but not at the angle. First I cauterizaed with bipolar cautery on long Fenestrated grasper but it was still bleeding. So two figure of eight stitches placed using 0-Vicryl with improved hemostasis. Irrigation was performed and cuff and pedicles appeared to be hemostatic. Arista sprayed on the cuff.  Robotic instruments were removed. Robot was undocked. Patient was made supine.  I scrubbed in and performed diagnostic Cystoscopy after removing foley. Procedure was uneventful. Bladder distended well, dome appeared intact and bilateral ureteral orifices and trigone normal and bilateral free flow of ureteral jets noted. Cystoscope removed and foley reinserted. Vaginal exam noted no active vaginal bleeding and no vaginal laceration from the manipulator.  I scrubbed up again. Lap'scope was reintroduced, cuff hemostasis was excellent under high and low pressures. Pneumoperitoneum deflated. All cannulas were removed under vision. Laparoscope and central port removed under vision. The stay sutures at the fascia tied together with excellent fascial closure. Skin approximated with subcuticular stitches on 4-0 Vicryl. Dermabond was applied. All counts were correct x2.  No complications.  Patient tolerated procedure well,  was brought to the PACU extubated and in stable condition.  Dr Benjie Karvonen was the surgeon for entire case.  --Azucena Fallen, MD

## 2019-11-05 DIAGNOSIS — N8501 Benign endometrial hyperplasia: Secondary | ICD-10-CM | POA: Diagnosis not present

## 2019-11-05 DIAGNOSIS — N736 Female pelvic peritoneal adhesions (postinfective): Secondary | ICD-10-CM | POA: Diagnosis not present

## 2019-11-05 DIAGNOSIS — N838 Other noninflammatory disorders of ovary, fallopian tube and broad ligament: Secondary | ICD-10-CM | POA: Diagnosis not present

## 2019-11-05 DIAGNOSIS — N950 Postmenopausal bleeding: Secondary | ICD-10-CM | POA: Diagnosis not present

## 2019-11-05 DIAGNOSIS — Z85828 Personal history of other malignant neoplasm of skin: Secondary | ICD-10-CM | POA: Diagnosis not present

## 2019-11-05 DIAGNOSIS — N83291 Other ovarian cyst, right side: Secondary | ICD-10-CM | POA: Diagnosis not present

## 2019-11-05 DIAGNOSIS — E119 Type 2 diabetes mellitus without complications: Secondary | ICD-10-CM | POA: Diagnosis not present

## 2019-11-05 DIAGNOSIS — Z86718 Personal history of other venous thrombosis and embolism: Secondary | ICD-10-CM | POA: Diagnosis not present

## 2019-11-05 DIAGNOSIS — N83292 Other ovarian cyst, left side: Secondary | ICD-10-CM | POA: Diagnosis not present

## 2019-11-05 DIAGNOSIS — D251 Intramural leiomyoma of uterus: Secondary | ICD-10-CM | POA: Diagnosis not present

## 2019-11-05 LAB — CBC
HCT: 43.7 % (ref 36.0–46.0)
Hemoglobin: 14.2 g/dL (ref 12.0–15.0)
MCH: 28.9 pg (ref 26.0–34.0)
MCHC: 32.5 g/dL (ref 30.0–36.0)
MCV: 89 fL (ref 80.0–100.0)
Platelets: 233 10*3/uL (ref 150–400)
RBC: 4.91 MIL/uL (ref 3.87–5.11)
RDW: 13.6 % (ref 11.5–15.5)
WBC: 15.1 10*3/uL — ABNORMAL HIGH (ref 4.0–10.5)
nRBC: 0 % (ref 0.0–0.2)

## 2019-11-05 LAB — BASIC METABOLIC PANEL
Anion gap: 9 (ref 5–15)
BUN: 9 mg/dL (ref 8–23)
CO2: 20 mmol/L — ABNORMAL LOW (ref 22–32)
Calcium: 8.6 mg/dL — ABNORMAL LOW (ref 8.9–10.3)
Chloride: 107 mmol/L (ref 98–111)
Creatinine, Ser: 0.84 mg/dL (ref 0.44–1.00)
GFR calc Af Amer: >60 mL/min (ref >60)
GFR calc non Af Amer: >60 mL/min (ref >60)
Glucose, Bld: 159 mg/dL — ABNORMAL HIGH (ref 70–99)
Potassium: 4.5 mmol/L (ref 3.5–5.1)
Sodium: 136 mmol/L (ref 135–145)

## 2019-11-05 LAB — CYTOLOGY - NON PAP

## 2019-11-05 LAB — SURGICAL PATHOLOGY

## 2019-11-05 LAB — GLUCOSE, CAPILLARY: Glucose-Capillary: 148 mg/dL — ABNORMAL HIGH (ref 70–99)

## 2019-11-05 MED ORDER — ENOXAPARIN SODIUM 40 MG/0.4ML ~~LOC~~ SOLN
SUBCUTANEOUS | Status: AC
  Filled 2019-11-05: qty 0.4

## 2019-11-05 MED ORDER — OXYCODONE HCL 5 MG PO TABS: 5.0000 mg | ORAL_TABLET | Freq: Four times a day (QID) | ORAL | 0 refills | Status: AC | PRN

## 2019-11-05 MED ORDER — OXYCODONE HCL 5 MG PO TABS
ORAL_TABLET | ORAL | Status: AC
  Filled 2019-11-05: qty 2

## 2019-11-05 MED ORDER — ACETAMINOPHEN 500 MG PO TABS
ORAL_TABLET | ORAL | Status: AC
  Filled 2019-11-05: qty 2

## 2019-11-05 MED ORDER — ACETAMINOPHEN 500 MG PO TABS: 1000.0000 mg | ORAL_TABLET | Freq: Four times a day (QID) | ORAL | 0 refills | Status: DC

## 2019-11-05 NOTE — Discharge Summary (Signed)
Physician Discharge Summary  Patient ID: Rhonda Hudson MRN: [id number] DOB/AGE: Dec 02, 1955 64 y.o.  Admit date: 11/04/2019 Discharge date: 11/05/2019  Admission Diagnoses:Endometrial complex hyperplasia without atypia. Postmenopausal bleeding   Discharge Diagnoses:  Active Problems:   Endometrial hyperplasia without atypia, complex   S/P laparoscopic hysterectomy DaVinci assisted total laparoscopic hysterectomy and bilateral salpingoophorectomy and pelvic washings on 11/04/19  Discharged Condition: good  Hospital Course: Uncomplicated. Received Lovenox post-operatively and will resume Xarelto at home on 2/22 night  Discharge Exam: Blood pressure 118/65, pulse 76, temperature 97.6 F (36.4 C), resp. rate 18, height 5\' 8"  (1.727 m), weight 97.4 kg, SpO2 95 %. General appearance: alert and cooperative Resp: clear to auscultation bilaterally Cardio: regular rate and rhythm, S1, S2 normal, no murmur, click, rub or gallop GI: soft, non-tender; bowel sounds normal; no masses,  no organomegaly Extremities: extremities normal, atraumatic, no cyanosis or edema and no edema, redness or tenderness in the calves or thighs Pulses: 2+ and symmetric Incision/Wound:no complications No vaginal bleeding   Disposition: Home, post op care and warning s/s reviewed   Discharge Instructions    Call MD for:   Complete by: As directed    Vaginal bleeding that is more than just few spots   Call MD for:  difficulty breathing, headache or visual disturbances   Complete by: As directed    Call MD for:  extreme fatigue   Complete by: As directed    Call MD for:  hives   Complete by: As directed    Call MD for:  persistant dizziness or light-headedness   Complete by: As directed    Call MD for:  persistant nausea and vomiting   Complete by: As directed    Call MD for:  redness, tenderness, or signs of infection (pain, swelling, redness, odor or green/yellow discharge around incision site)   Complete  by: As directed    Call MD for:  severe uncontrolled pain   Complete by: As directed    Call MD for:  temperature >100.4   Complete by: As directed    Diet - low sodium heart healthy   Complete by: As directed    Increase activity slowly   Complete by: As directed    Lifting restrictions   Complete by: As directed    No more than 5 pounds till 6 weeks   Sexual Activity Restrictions   Complete by: As directed    Nothing in vagina till 8 weeks     Allergies as of 11/05/2019   No Known Allergies     Medication List    TAKE these medications   acetaminophen 500 MG tablet Commonly known as: TYLENOL Take 2 tablets (1,000 mg total) by mouth every 6 (six) hours.   atorvastatin 10 MG tablet Commonly known as: LIPITOR Take 1 tablet (10 mg total) by mouth daily.   metFORMIN 500 MG tablet Commonly known as: GLUCOPHAGE Take 1 tablet (500 mg total) by mouth 2 (two) times daily with a meal.   oxyCODONE 5 MG immediate release tablet Commonly known as: Oxy IR/ROXICODONE Take 1 tablet (5 mg total) by mouth every 6 (six) hours as needed for up to 5 days for moderate pain.   Xarelto 20 MG Tabs tablet Generic drug: rivaroxaban TAKE 1 TABLET BY MOUTH DAILY WITH SUPPER What changed: See the new instructions.        SignedElveria Royals 11/05/2019, 9:00 AM

## 2019-11-05 NOTE — Discharge Instructions (Signed)
Laparoscopically  Hysterectomy, Care After This sheet gives you information about how to care for yourself after your procedure. Your health care provider may also give you more specific instructions. If you have problems or questions, contact your health care provider. What can I expect after the procedure? After the procedure, it is common to have:  Soreness and numbness in your incision areas.  Abdominal pain. You will be given pain medicine to control it.  Vaginal bleeding and discharge. You will need to use a sanitary napkin after this procedure.  Sore throat from the breathing tube that was inserted during surgery. Follow these instructions at home: Medicines  Take over-the-counter and prescription medicines only as told by your health care provider.  Do not take aspirin or ibuprofen. These medicines can cause bleeding.  Do not drive or use heavy machinery while taking prescription pain medicine.  Do not drive for 24 hours if you were given a medicine to help you relax (sedative) during the procedure. Incision care   Follow instructions from your health care provider about how to take care of your incisions. Make sure you: ? Wash your hands with soap and water before you change your bandage (dressing). If soap and water are not available, use hand sanitizer. ? Change your dressing as told by your health care provider. ? Leave stitches (sutures), skin glue, or adhesive strips in place. These skin closures may need to stay in place for 2 weeks or longer. If adhesive strip edges start to loosen and curl up, you may trim the loose edges. Do not remove adhesive strips completely unless your health care provider tells you to do that.  Check your incision area every day for signs of infection. Check for: ? Redness, swelling, or pain. ? Fluid or blood. ? Warmth. ? Pus or a bad smell. Activity  Get regular exercise as told by your health care provider. You may be told to take short  walks every day and go farther each time.  Return to your normal activities as told by your health care provider. Ask your health care provider what activities are safe for you.  Do not douche, use tampons, or have sexual intercourse for at least 6 weeks, or until your health care provider gives you permission.  Do not lift anything that is heavier than 10 lb (4.5 kg), or the limit that your health care provider tells you, until he or she says that it is safe. General instructions  Do not take baths, swim, or use a hot tub until your health care provider approves. Take showers instead of baths.  Do not drive for 24 hours if you received a sedative.  Do not drive or operate heavy machinery while taking prescription pain medicine.  To prevent or treat constipation while you are taking prescription pain medicine, your health care provider may recommend that you: ? Drink enough fluid to keep your urine clear or pale yellow. ? Take over-the-counter or prescription medicines. ? Eat foods that are high in fiber, such as fresh fruits and vegetables, whole grains, and beans. ? Limit foods that are high in fat and processed sugars, such as fried and sweet foods.  Keep all follow-up visits as told by your health care provider. This is important. Contact a health care provider if:  You have signs of infection, such as: ? Redness, swelling, or pain around your incision sites. ? Fluid or blood coming from an incision. ? An incision that feels warm to the touch. ?  Pus or a bad smell coming from an incision.  Your incision breaks open.  Your pain medicine is not helping.  You feel dizzy or light-headed.  You have pain or bleeding when you urinate.  You have persistent nausea and vomiting.  You have blood, pus, or a bad-smelling discharge from your vagina. Get help right away if:  You have a fever.  You have severe abdominal pain.  You have chest pain.  You have shortness of  breath.  You faint.  You have pain, swelling, or redness in your leg.  You have heavy bleeding from your vagina. Summary  After the procedure, it is common to have abdominal pain and vaginal bleeding.  You should not drive or lift heavy objects until your health care provider says that it is safe.  Contact your health care provider if you have any symptoms of infection, excessive vaginal bleeding, nausea, vomiting, or shortness of breath. This information is not intended to replace advice given to you by your health care provider. Make sure you discuss any questions you have with your health care provider. Document Revised: 08/15/2017 Document Reviewed: 10/29/2016 Elsevier Patient Education  2020 [employer].

## 2019-11-22 ENCOUNTER — Other Ambulatory Visit: Payer: Self-pay | Admitting: Internal Medicine

## 2019-11-22 DIAGNOSIS — R760 Raised antibody titer: Secondary | ICD-10-CM

## 2019-11-24 ENCOUNTER — Telehealth: Payer: Self-pay | Admitting: Hematology

## 2019-11-24 NOTE — Telephone Encounter (Signed)
Pt called to scheduled appt. 3/22 appt confirmed, pt made aware to arrive 15-30 mins early and also to bring insurance card with photo ID

## 2019-11-25 DIAGNOSIS — H25041 Posterior subcapsular polar age-related cataract, right eye: Secondary | ICD-10-CM | POA: Diagnosis not present

## 2019-11-25 DIAGNOSIS — H2511 Age-related nuclear cataract, right eye: Secondary | ICD-10-CM | POA: Diagnosis not present

## 2019-11-25 DIAGNOSIS — H26492 Other secondary cataract, left eye: Secondary | ICD-10-CM | POA: Diagnosis not present

## 2019-12-06 ENCOUNTER — Inpatient Hospital Stay: Payer: Medicare HMO

## 2019-12-06 ENCOUNTER — Inpatient Hospital Stay: Payer: Medicare HMO | Attending: Hematology | Admitting: Hematology

## 2019-12-06 ENCOUNTER — Other Ambulatory Visit: Payer: Self-pay

## 2019-12-06 VITALS — BP 121/82 | HR 73 | Temp 98.7°F | Resp 18 | Ht 68.0 in | Wt 214.0 lb

## 2019-12-06 DIAGNOSIS — E0590 Thyrotoxicosis, unspecified without thyrotoxic crisis or storm: Secondary | ICD-10-CM | POA: Diagnosis not present

## 2019-12-06 DIAGNOSIS — R350 Frequency of micturition: Secondary | ICD-10-CM | POA: Diagnosis not present

## 2019-12-06 DIAGNOSIS — F17210 Nicotine dependence, cigarettes, uncomplicated: Secondary | ICD-10-CM | POA: Diagnosis not present

## 2019-12-06 DIAGNOSIS — D682 Hereditary deficiency of other clotting factors: Secondary | ICD-10-CM | POA: Insufficient documentation

## 2019-12-06 DIAGNOSIS — D6862 Lupus anticoagulant syndrome: Secondary | ICD-10-CM | POA: Diagnosis not present

## 2019-12-06 DIAGNOSIS — Z86718 Personal history of other venous thrombosis and embolism: Secondary | ICD-10-CM

## 2019-12-06 DIAGNOSIS — E1136 Type 2 diabetes mellitus with diabetic cataract: Secondary | ICD-10-CM | POA: Diagnosis not present

## 2019-12-06 DIAGNOSIS — Z7984 Long term (current) use of oral hypoglycemic drugs: Secondary | ICD-10-CM | POA: Insufficient documentation

## 2019-12-06 DIAGNOSIS — Z833 Family history of diabetes mellitus: Secondary | ICD-10-CM | POA: Diagnosis not present

## 2019-12-06 DIAGNOSIS — D688 Other specified coagulation defects: Secondary | ICD-10-CM

## 2019-12-06 DIAGNOSIS — K219 Gastro-esophageal reflux disease without esophagitis: Secondary | ICD-10-CM

## 2019-12-06 DIAGNOSIS — Z79899 Other long term (current) drug therapy: Secondary | ICD-10-CM | POA: Insufficient documentation

## 2019-12-06 DIAGNOSIS — D6852 Prothrombin gene mutation: Secondary | ICD-10-CM | POA: Diagnosis not present

## 2019-12-06 DIAGNOSIS — Z7901 Long term (current) use of anticoagulants: Secondary | ICD-10-CM | POA: Diagnosis not present

## 2019-12-06 DIAGNOSIS — I82592 Chronic embolism and thrombosis of other specified deep vein of left lower extremity: Secondary | ICD-10-CM | POA: Diagnosis not present

## 2019-12-06 DIAGNOSIS — R760 Raised antibody titer: Secondary | ICD-10-CM

## 2019-12-06 NOTE — Progress Notes (Signed)
HEMATOLOGY/ONCOLOGY CONSULTATION NOTE  Date of Service: 12/06/2019  Patient Care Team: Girtha Rm, NP-C as PCP - General (Family Medicine)  CHIEF COMPLAINTS/PURPOSE OF CONSULTATION:   Positive Lupus Anticoagulant  HISTORY OF PRESENTING ILLNESS:   Rhonda Hudson is a wonderful 64 y.o. female who has been referred to [place] by Dr Harland Dingwall, NP-C for evaluation and management of positive lupus anticoagulant. The pt reports that she is doing well overall.   The pt reports that she previously saw Dr. Maylon Peppers for her positive lupus anticoagulant and she was placed on life-long anticoagulation. She denies any new bleeding concerns since beginning Xarelto. Pt was not told to hold her Xarelto for her latest lupus anticoagulation test.   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 Pt spoke to Vascular Surgery in November 2020 and they did not suggest that pt could have May-Thurner syndrome. No direct cause for her localized blood clots has ever been established. She had her first clot in April of 2017 and was on anticoagulation for a period of time. After going off of her anticoagulation she was found to have another blood clot in April of 2018. She was smoking during both times, but denies any new medications or hormone treatments. In April of 2017 her sister-in law passed away and this caused the pt significant stress and sadness. Pt recalls feeling almost faint directly after the service. In April of 2018 she was splitting wood with her son when her leg began feeling abnormal so she was taken to the ED where they found the repeat clot. Pt has a significant family history of blood clots. Pt is currently smoking about 1/2 ppd. She is not experiencing much pain in her left leg now but notes that it does hurt when she moves it a certain way.   She has a history of post-menopausal bleeding but had a total hysterectomy on 11/04/2019. She was told to hold the Xarelto for  two days prior to surgery and for two days after. Pt notes that on the second day after surgery her toes were numb, but not painful.  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 She is also seeing an Endocrinologist who monitors her thyroid nodule. There has been some conversation about a thyroidectomy, but pt is not interested in taking thyroid replacement medication. Pt noted some recent urinary frequency that was worsened after her recent hysterectomy.   Most recent lab results (11/05/2019) of CBC and BMP is as follows: all values are WNL except for WBC at 15.1K, CO2 at 20, Glucose at 159, Calcium at 8.6. 10/27/2019 Lupus anticoagulant is as follows: PTT Lupus Anticoagulant at at 64.2  On review of systems, pt reports urinary frequency and denies new/unexplained bleeding, left leg pain and any other symptoms.   On PMHx the pt reports Diabetes, DVT LLE x2, prothromin gene mutation, Hiatal hernia with Gastroesophageal Reflux, Hyperthyroidism, Mass of Right Parotid Gland, Thyroid nodule, Cholecystectomy, Hysterectomy. On Social Hx the pt reports she is currently smoking 1/2 ppd. On Family Hx the pt reports significant family history of blood clots.   MEDICAL HISTORY:  Past Medical History:  Diagnosis Date  . Cancer ([place])    skin Ca on phase and back  . Cataracts, bilateral   . Chronic anticoagulation 07/15/2017  . Diabetes mellitus without complication ([place])   . Diverticulitis   .  DVT (deep venous thrombosis) ([place])   . DVT of lower extremity (deep venous thrombosis) ([place]) 01/04/2016  . 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 Hiatal hernia with gastroesophageal reflux    disease  . History of prediabetes   . Hyperthyroidism   . Insomnia   . Irritable bowel syndrome   . 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 Panic attacks    Hx of  . Thickened endometrium   .  Thyroid nodule 03/03/2019    SURGICAL HISTORY: Past Surgical History:  Procedure Laterality Date  . CHOLECYSTECTOMY    . CYSTOSCOPY  11/04/2019   Procedure: CYSTOSCOPY;  Surgeon: Azucena Fallen, MD;  Location: [facility];  Service: Gynecology;;  . DILATION AND CURETTAGE OF UTERUS N/A 06/16/2018   Procedure: DILATATION AND CURETTAGE;  Surgeon: Emily Filbert, MD;  Location: [place];  Service: Gynecology;  Laterality: N/A;  . ROBOTIC ASSISTED TOTAL HYSTERECTOMY WITH BILATERAL SALPINGO OOPHERECTOMY Bilateral 11/04/2019   Procedure: XI ROBOTIC ASSISTED TOTAL HYSTERECTOMY WITH BILATERAL SALPINGO OOPHORECTOMY/Pelvic Washings;  Surgeon: Azucena Fallen, MD;  Location[facility];  Service: Gynecology;  Laterality: Bilateral;  Tracie to RNFA confirmed on 10/15/19 CS  . TOE SURGERY    . TUBAL LIGATION    . US ECHOCARDIOGRAPHY  01-31-2009   EF 60%    SOCIAL HISTORY: Social History   Socioeconomic History  . Marital status: Divorced    Spouse name: Not on file  . Number of children: 2  . Years of education: Not on file  . Highest education level: Not on file  Occupational History  . Occupation: Statistician  Tobacco Use  . Smoking status: Current Every Day Smoker    Packs/day: 0.50    Types: Cigarettes  . Smokeless tobacco: Never Used  Substance and Sexual Activity  . Alcohol use: No    Alcohol/week: 0.0 standard drinks  . Drug use: No  . Sexual activity: Not on file  Other Topics Concern  . Not on file  Social History Narrative   Lives with two sons   Caffeine use: coffee and coke daily   Right-handed   Social Determinants of Health   Financial Resource Strain:   . Difficulty of Paying Living Expenses:   Food Insecurity:   . Worried About Charity fundraiser in the Last Year:   . Arboriculturist in the Last Year:   Transportation Needs:   . Film/video editor (Medical):   Marland Kitchen Lack of Transportation (Non-Medical):   Physical  Activity:   . Days of Exercise per Week:   . Minutes of Exercise per Session:   Stress:   . Feeling of Stress :   Social Connections:   . Frequency of Communication with Friends and Family:   . Frequency of Social Gatherings with Friends and Family:   . Attends Religious Services:   . Active Member of Clubs or Organizations:   . Attends Archivist Meetings:   Marland Kitchen Marital Status:   Intimate Partner Violence:   . Fear of Current or Ex-Partner:   . Emotionally Abused:   Marland Kitchen Physically Abused:   . Sexually Abused:     FAMILY HISTORY: Family History  Problem Relation Age of Onset  . Diabetes Mother   . Leukemia Father   . Leukemia Sister   . Diabetes Sister   . Leukemia Brother   . Multiple sclerosis Neg Hx   . Autoimmune  disease Neg Hx   . Thyroid disease Neg Hx     ALLERGIES:  has No Known Allergies.  MEDICATIONS:  Current Outpatient Medications  Medication Sig Dispense Refill  . acetaminophen (TYLENOL) 500 MG tablet Take 2 tablets (1,000 mg total) by mouth every 6 (six) hours. 30 tablet 0  . atorvastatin (LIPITOR) 10 MG tablet Take 1 tablet (10 mg total) by mouth daily. 90 tablet 1  . metFORMIN (GLUCOPHAGE) 500 MG tablet Take 1 tablet (500 mg total) by mouth 2 (two) times daily with a meal. 90 tablet 3  . XARELTO 20 MG TABS tablet TAKE 1 TABLET BY MOUTH DAILY WITH SUPPER (Patient taking differently: Take 20 mg by mouth daily with supper. ) 30 tablet 5   No current facility-administered medications for this visit.    REVIEW OF SYSTEMS:    10 Point review of Systems was done is negative except as noted above.  PHYSICAL EXAMINATION: ECOG PERFORMANCE STATUS: 1 - Symptomatic but completely ambulatory  . Vitals:   12/06/19 1313  BP: 121/82  Pulse: 73  Resp: 18  Temp: 98.7 F (37.1 C)  SpO2: 100%   Filed Weights   12/06/19 1313  Weight: 214 lb (97.1 kg)   .Body mass index is 32.54 kg/m.   GENERAL:alert, in no acute distress and comfortable SKIN: no  acute rashes, no significant lesions EYES: conjunctiva are pink and non-injected, sclera anicteric OROPHARYNX: MMM, no exudates, no oropharyngeal erythema or ulceration NECK: supple, no JVD LYMPH:  no palpable lymphadenopathy in the cervical, axillary or inguinal regions LUNGS: clear to auscultation b/l with normal respiratory effort HEART: regular rate & rhythm ABDOMEN:  normoactive bowel sounds , non tender, not distended. Extremity: no pedal edema PSYCH: alert & oriented x 3 with fluent speech NEURO: no focal motor/sensory deficits  LABORATORY DATA:  I have reviewed the data as listed  . CBC Latest Ref Rng & Units 11/05/2019 10/28/2019 03/29/2019  WBC 4.0 - 10.5 K/uL 15.1(H) 11.4(H) 11.4(H)  Hemoglobin 12.0 - 15.0 g/dL 14.2 14.6 14.3  Hematocrit 36.0 - 46.0 % 43.7 44.9 43.4  Platelets 150 - 400 K/uL 233 235 414(H)    . CMP Latest Ref Rng & Units 11/05/2019 10/28/2019 08/25/2019  Glucose 70 - 99 mg/dL 159(H) 99 -  BUN 8 - 23 mg/dL 9 13 -  Creatinine 0.44 - 1.00 mg/dL 0.84 0.88 -  Sodium 135 - 145 mmol/L 136 138 -  Potassium 3.5 - 5.1 mmol/L 4.5 4.7 -  Chloride 98 - 111 mmol/L 107 104 -  CO2 22 - 32 mmol/L 20(L) 26 -  Calcium 8.9 - 10.3 mg/dL 8.6(L) 9.9 -  Total Protein 6.5 - 8.1 g/dL - 7.7 -  Total Bilirubin 0.3 - 1.2 mg/dL - 0.5 -  Alkaline Phos 38 - 126 U/L - 80 -  AST 15 - 41 U/L - 14(L) -  ALT 0 - 44 U/L - 21 14     RADIOGRAPHIC STUDIES: I have personally reviewed the radiological images as listed and agreed with the findings in the report. No results found.  ASSESSMENT & PLAN:   64 yo with   1) Recurrent LLE DVT  No clear single provoking factor Risk factors - smoking and prothrombin gene mutation  2) Heterozygous prothrombin gene mutation  3)  positive lupus anticoagulant.- true positive vs false positive from Xarelto PLAN: -Discussed patient's most recent labs from 11/05/2019, all values are WNL except for WBC at 15.1K, CO2 at 20, Glucose at 159, Calcium at  8.6. -Discussed 10/27/2019 lupus anticoagulant positive result -Advised that her latest Lupus Anticoagulant test may not be accurate because her anticoagulation was not held prior to the test  -Advised pt that heterozygous prothrombin mutation carries a 2-4 fold risk of blood clot for pt's her age  -Would recommend that the pt continue anticoagulation long-term due to her heterozygous prothrombin mutation, smoking, varicose veins and potential positive Lupus Anticoagulant and in the context of 2 previous unprovoked DVTs in LLE. -could recheck lupus anticoagulant with PCP (would recommend holding Xarelto for 48h prior to test). Would not change recommendations for continued anticoagulation. -Advised pt hat her parotid mass could be an additional risk factor for blood clots -would recommend  Continue f/u with ENT -Advised pt that her smoking will be an ongoing risk factor - pt working on cutting down her smoking -Will send recommendations over to her PCP -Will see back as needed   FOLLOW UP: RTC with Dr. Irene Limbo as needed   All of the patients questions were answered with apparent satisfaction. The patient knows to call the clinic with any problems, questions or concerns.  I spent 40 mins  counseling the patient face to face. The total time spent in the appointment was 60 minutes and more than 50% was on counseling and direct patient cares.    Sullivan Lone MD [place] AAHIVMS [facility] Hematology/Oncology Physician [facility]  (Office):       [phone] (Work cell):  [phone] (Fax):           [phone]  12/06/2019 4:20 PM  I, Yevette Edwards, am acting as a scribe for Dr. Sullivan Lone.   .I have reviewed the above documentation for accuracy and completeness, and I agree with the above. Brunetta Genera MD

## 2019-12-09 ENCOUNTER — Telehealth: Payer: Self-pay | Admitting: Internal Medicine

## 2019-12-09 NOTE — Telephone Encounter (Signed)
Pt states her brother was feeling bad last Wednesday and Thursday but they went out to eat on Friday together. He went and was positive on Monday. She is not having any symptoms and did not get covid shots.   No symptoms by Sunday and negative test then she would be ok to come out of quartinine per vickie.

## 2019-12-13 ENCOUNTER — Encounter: Payer: Self-pay | Admitting: Family Medicine

## 2019-12-29 ENCOUNTER — Other Ambulatory Visit: Payer: Self-pay | Admitting: Family Medicine

## 2019-12-29 DIAGNOSIS — E7800 Pure hypercholesterolemia, unspecified: Secondary | ICD-10-CM

## 2020-01-04 DIAGNOSIS — H25811 Combined forms of age-related cataract, right eye: Secondary | ICD-10-CM | POA: Diagnosis not present

## 2020-01-04 DIAGNOSIS — H2511 Age-related nuclear cataract, right eye: Secondary | ICD-10-CM | POA: Diagnosis not present

## 2020-01-04 DIAGNOSIS — H25041 Posterior subcapsular polar age-related cataract, right eye: Secondary | ICD-10-CM | POA: Diagnosis not present

## 2020-01-06 ENCOUNTER — Encounter: Payer: Self-pay | Admitting: Endocrinology

## 2020-01-06 ENCOUNTER — Other Ambulatory Visit: Payer: Self-pay

## 2020-01-06 ENCOUNTER — Ambulatory Visit: Payer: Medicare HMO | Admitting: Endocrinology

## 2020-01-06 VITALS — BP 124/86 | HR 73 | Ht 68.0 in | Wt 216.0 lb

## 2020-01-06 DIAGNOSIS — E119 Type 2 diabetes mellitus without complications: Secondary | ICD-10-CM

## 2020-01-06 LAB — POCT GLYCOSYLATED HEMOGLOBIN (HGB A1C): Hemoglobin A1C: 6.3 % — AB (ref 4.0–5.6)

## 2020-01-06 NOTE — Progress Notes (Signed)
Subjective:    Patient ID: Rhonda Hudson, female    DOB: [birth year], 64 y.o.   MRN: [id number]  HPI Pt returns for f/u of diabetes mellitus:  DM type: 2 Dx'ed: [id number] Complications: none Therapy: metformin GDM: never DKA: never Severe hypoglycemia: never Pancreatitis: never Pancreatic imaging: normal on 2010 CT Other: she has never taken insulin Interval history: she takes metformin as rx'ed, and tolerates well.   Pt also resturns for f/u of MNG (dx'ed 1981; a cyst was drained then; she then took an uncertain medication x 1 year, but not since; [place] (2020) showed 5.5 cm right lobe cyst; Left upper thyroid nodule (labeled 2) meets criteria for biopsy.  Left lower thyroid nodule (labeled 3) meets criteria for surveillance; Cytology (2020) was cat 1). Pt says the goiter seems unchanged. Past Medical History:  Diagnosis Date  . Cancer ([place])    skin Ca on phase and back  . Cataracts, bilateral   . Chronic anticoagulation 07/15/2017  . Diabetes mellitus without complication ([place])   . Diverticulitis   . DVT (deep venous thrombosis) ([place])   . DVT of lower extremity (deep venous thrombosis) ([place]) 01/04/2016  . 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 Hiatal hernia with gastroesophageal reflux    disease  . History of prediabetes   . Hyperthyroidism   . Insomnia   . Irritable bowel syndrome   . 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 Panic attacks    Hx of  . Thickened endometrium   . Thyroid nodule 03/03/2019    Past Surgical History:  Procedure Laterality Date  . CHOLECYSTECTOMY    . CYSTOSCOPY  11/04/2019   Procedure: CYSTOSCOPY;  Surgeon: Azucena Fallen, MD;  Location: [facility];  Service: Gynecology;;  . DILATION AND CURETTAGE OF UTERUS N/A 06/16/2018   Procedure: DILATATION AND CURETTAGE;  Surgeon: Emily Filbert, MD;  Location: [place];  Service: Gynecology;  Laterality: N/A;  . ROBOTIC ASSISTED TOTAL  HYSTERECTOMY WITH BILATERAL SALPINGO OOPHERECTOMY Bilateral 11/04/2019   Procedure: XI ROBOTIC ASSISTED TOTAL HYSTERECTOMY WITH BILATERAL SALPINGO OOPHORECTOMY/Pelvic Washings;  Surgeon: Azucena Fallen, MD;  Location: [facility];  Service: Gynecology;  Laterality: Bilateral;  Tracie to RNFA confirmed on 10/15/19 CS  . TOE SURGERY    . TUBAL LIGATION    . US ECHOCARDIOGRAPHY  01-31-2009   EF 60%    Social History   Socioeconomic History  . Marital status: Divorced    Spouse name: Not on file  . Number of children: 2  . Years of education: Not on file  . Highest education level: Not on file  Occupational History  . Occupation: Statistician  Tobacco Use  . Smoking status: Current Every Day Smoker    Packs/day: 0.50    Types: Cigarettes  . Smokeless tobacco: Never Used  Substance and Sexual Activity  . Alcohol use: No    Alcohol/week: 0.0 standard drinks  . Drug use: No  . Sexual activity: Not on file  Other Topics Concern  . Not on file  Social History Narrative   Lives with two sons   Caffeine use: coffee and coke daily   Right-handed   Social Determinants of Health   Financial Resource Strain:   . Difficulty of Paying Living Expenses:   Food Insecurity:   . Worried About Charity fundraiser in the Last Year:   . [place] in the Last Year:  Transportation Needs:   . Film/video editor (Medical):   Marland Kitchen Lack of Transportation (Non-Medical):   Physical Activity:   . Days of Exercise per Week:   . Minutes of Exercise per Session:   Stress:   . Feeling of Stress :   Social Connections:   . Frequency of Communication with Friends and Family:   . Frequency of Social Gatherings with Friends and Family:   . Attends Religious Services:   . Active Member of Clubs or Organizations:   . Attends Archivist Meetings:   Marland Kitchen Marital Status:   Intimate Partner Violence:   . Fear of Current or Ex-Partner:   . Emotionally Abused:   Marland Kitchen Physically  Abused:   . Sexually Abused:     Current Outpatient Medications on File Prior to Visit  Medication Sig Dispense Refill  . acetaminophen (TYLENOL) 500 MG tablet Take 2 tablets (1,000 mg total) by mouth every 6 (six) hours. 30 tablet 0  . atorvastatin (LIPITOR) 10 MG tablet TAKE 1 TABLET(10 MG) BY MOUTH DAILY 90 tablet 1  . metFORMIN (GLUCOPHAGE) 500 MG tablet Take 1 tablet (500 mg total) by mouth 2 (two) times daily with a meal. 90 tablet 3  . XARELTO 20 MG TABS tablet TAKE 1 TABLET BY MOUTH DAILY WITH SUPPER (Patient taking differently: Take 20 mg by mouth daily with supper. ) 30 tablet 5   No current facility-administered medications on file prior to visit.    No Known Allergies  Family History  Problem Relation Age of Onset  . Diabetes Mother   . Leukemia Father   . Leukemia Sister   . Diabetes Sister   . Leukemia Brother   . Multiple sclerosis Neg Hx   . Autoimmune disease Neg Hx   . Thyroid disease Neg Hx     BP 124/86 (BP Location: Left Arm, Patient Position: Sitting, Cuff Size: Normal)   Pulse 73   Ht 5\' 8"  (1.727 m)   Wt 216 lb (98 kg)   SpO2 97%   BMI 32.84 kg/m    Review of Systems Pt states "sensation" at the left jaw angle    Objective:   Physical Exam VITAL SIGNS:  See vs page GENERAL: no distress FACE: at the left jaw angle, no swell/tend/nodule Pulses: dorsalis pedis intact bilat.   MSK: no deformity of the feet CV: trace bilat leg edema, and bilat vv's Skin:  no ulcer on the feet.  normal color and temp on the feet. Neuro: sensation is intact to touch on the feet  Lab Results  Component Value Date   HGBA1C 6.3 (A) 01/06/2020        Assessment & Plan:  Jaw sensation, new, uncertain etiology.  In view of h/o parotid nodule, she should see ENT if it persists Type 2 DM: well-controlled Edema: This limits rx options   Patient Instructions  Please continue the same metformin.  If the sensation at the left jaw persists, please see your ENT  doctor.  Please come back for a follow-up appointment in 6 months.

## 2020-01-06 NOTE — Patient Instructions (Signed)
Please continue the same metformin.  If the sensation at the left jaw persists, please see your ENT doctor.  Please come back for a follow-up appointment in 6 months.

## 2020-01-13 ENCOUNTER — Encounter: Payer: Self-pay | Admitting: Family Medicine

## 2020-01-13 ENCOUNTER — Telehealth (INDEPENDENT_AMBULATORY_CARE_PROVIDER_SITE_OTHER): Payer: Medicare HMO | Admitting: Family Medicine

## 2020-01-13 ENCOUNTER — Other Ambulatory Visit: Payer: Self-pay

## 2020-01-13 DIAGNOSIS — J029 Acute pharyngitis, unspecified: Secondary | ICD-10-CM | POA: Diagnosis not present

## 2020-01-13 DIAGNOSIS — J069 Acute upper respiratory infection, unspecified: Secondary | ICD-10-CM | POA: Diagnosis not present

## 2020-01-13 NOTE — Progress Notes (Signed)
   Subjective:  Documentation for virtual audio and video telecommunications through [place] encounter:  The patient was located at home. 2 patient identifiers used.  The provider was located in the office. The patient did consent to this visit and is aware of possible charges through their insurance for this visit.  The other persons participating in this telemedicine service were none. Time spent on call was 12 minutes and in review of previous records 15 minutes total.  This virtual service is not related to other E/M service within previous 7 days.   Patient ID: Rhonda Hudson, female    DOB: Nov 21, 1955, 64 y.o.   MRN: [id number]  HPI Chief Complaint  Patient presents with  . sick    sick- runny nose, congestion, sinus pressure, sore throat started yesterday. hasn't been anywhere. suppose to have eye surgery tuesday   Complains of a 2 day history of rhinorrhea, nasal congestion, sinus pain, sore throat, and headache. She is also sneezing. Mild cough.  Denies fever, chills, body aches, dizziness, chest pain, palpitations, shortness of breath, abdominal pain, N/V/D.  No loss of taste or smell.   States she is supposed to have an eye procedure next Tuesday.  She has not taken any medication.   She did not get her Covid vaccines.   She is working on cutting back on smoking.   Reviewed allergies, medications, past medical, surgical, family, and social history.    Review of Systems Pertinent positives and negatives in the history of present illness.     Objective:   Physical Exam There were no vitals taken for this visit.  Alert and oriented and in no acute distress.  She is sniffling throughout the visit.  Normal respirations.  Speaking in complete sentence without difficulty.  Normal speech, mood and thought process.      Assessment & Plan:  Upper respiratory tract infection, unspecified type  Acute pharyngitis, unspecified etiology  Discussed symptomatic  treatment.  She may try Mucinex and a nasal steroid spray.  Tylenol as needed.  Encouraged good hydration. Strongly encouraged her to call and schedule a Covid test. She will follow-up if worsening or not improved.  I also asked her to call let me know the result of her Covid test.

## 2020-01-14 ENCOUNTER — Telehealth: Payer: Self-pay | Admitting: Internal Medicine

## 2020-01-14 NOTE — Telephone Encounter (Signed)
Pt called and states that she got an appt for Sunday at 12:15pm at [facility]. She sounded worse today and I advised her to go to the ER as she did not sound good and she refused and said she just had a nasty covid with sinuses and felt bad

## 2020-02-26 IMAGING — CT CT NECK WITH CONTRAST
3 of 4 series · 14 of 33 positions shown, 17 images · IV contrast (omnipaque)
Comparison: 03/02/2019

CLINICAL DATA: Recent diagnosis neck tumor. Difficulty swallowing.
Unable to open. Pain in right side

EXAM:
CT NECK WITH CONTRAST
TECHNIQUE: Multidetector CT imaging of the neck was performed using the
standard protocol following the bolus administration of intravenous
contrast.
CONTRAST:  75mL OMNIPAQUE IOHEXOL 300 MG/ML  SOLN

[Series 4: cor neck · coronal · 0.53mm/px · 3 of 143 slices shown]
[im 48/143  bone]
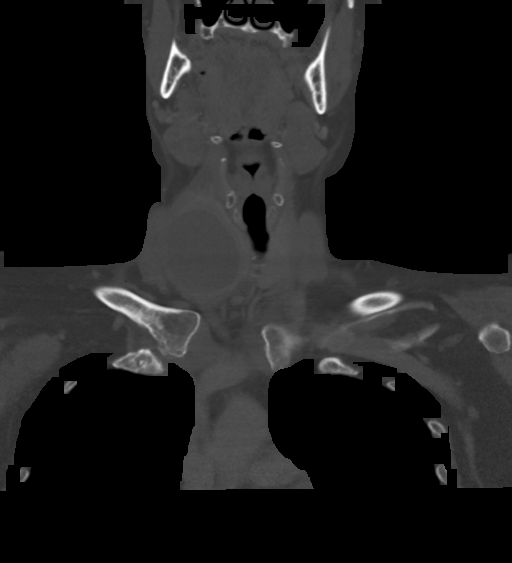
[im 64/143  bone]
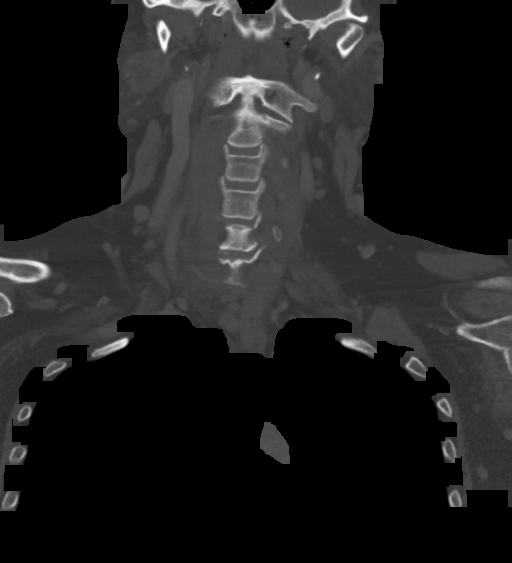
[im 80/143  bone]
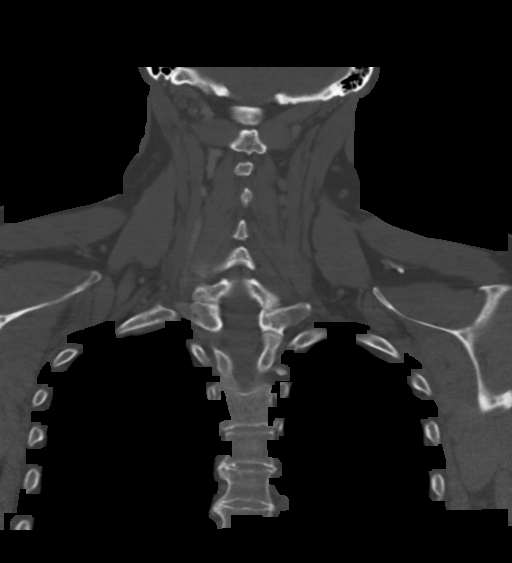

[Series 5: sag neck · sagittal · 0.58mm/px · 5 of 135 slices shown, 6 images]
[im 45/135  bone]
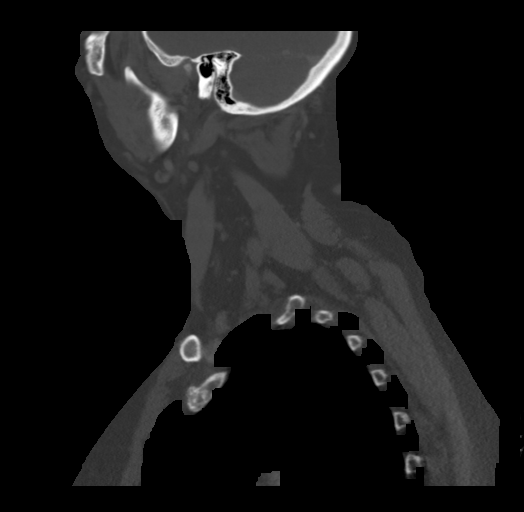
[im 56/135  bone]
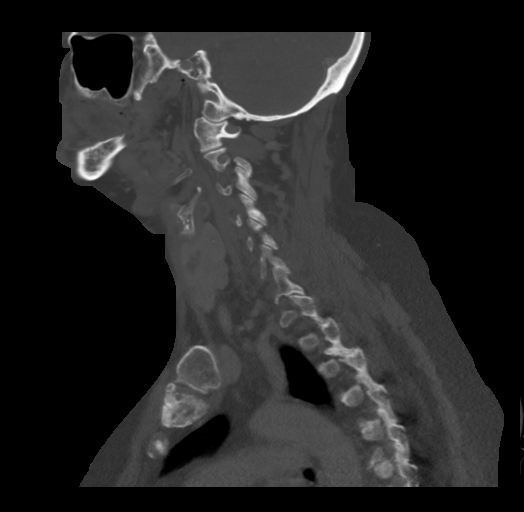
[im 68/135  soft-tissue]
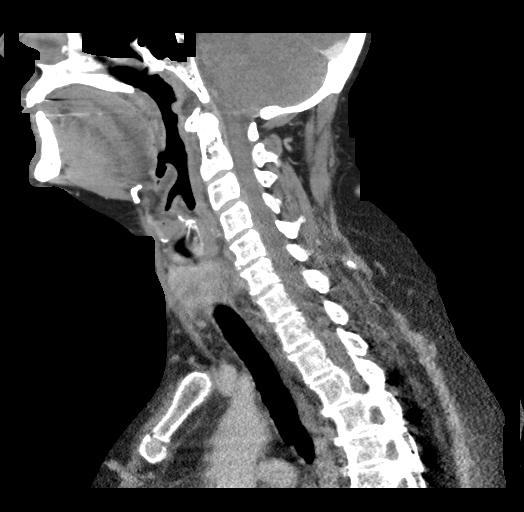
[im 68/135  bone]
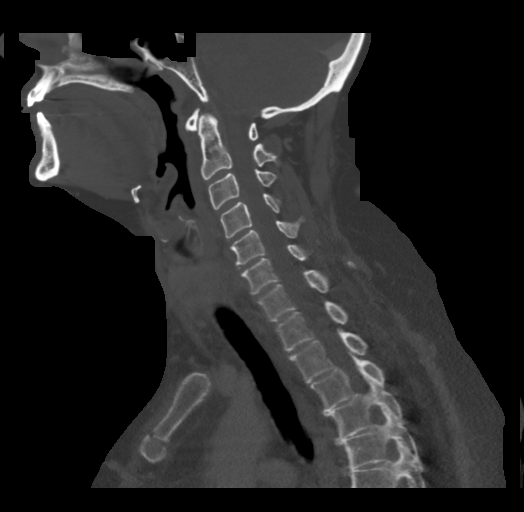
[im 79/135  bone]
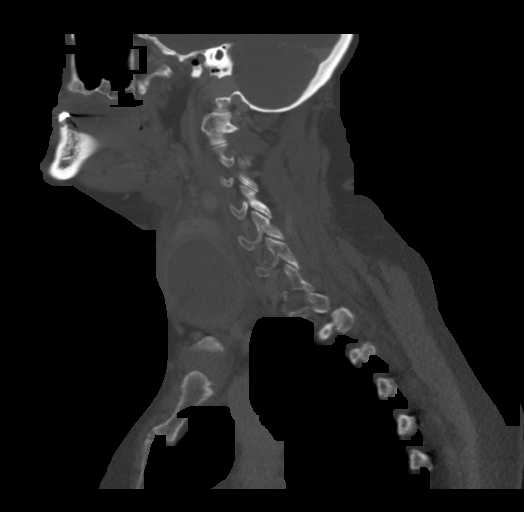
[im 90/135  bone]
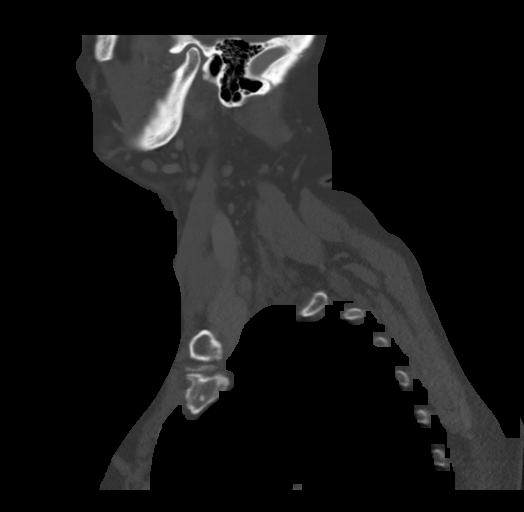

[Series 6: ax oropharynx · axial · 0.52mm/px · z∈[-172,+39]mm · 6 of 161 slices shown, 8 images]
[im 23/161  soft-tissue]
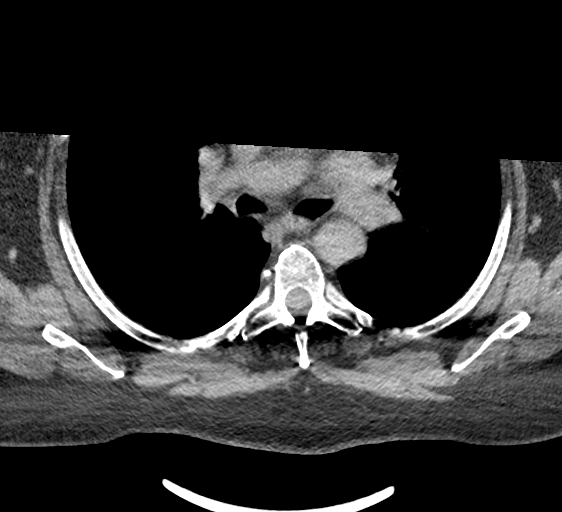
[im 23/161  bone]
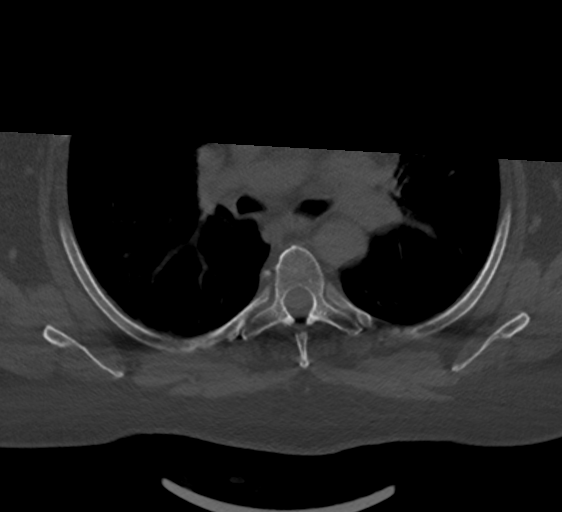
[im 46/161  bone]
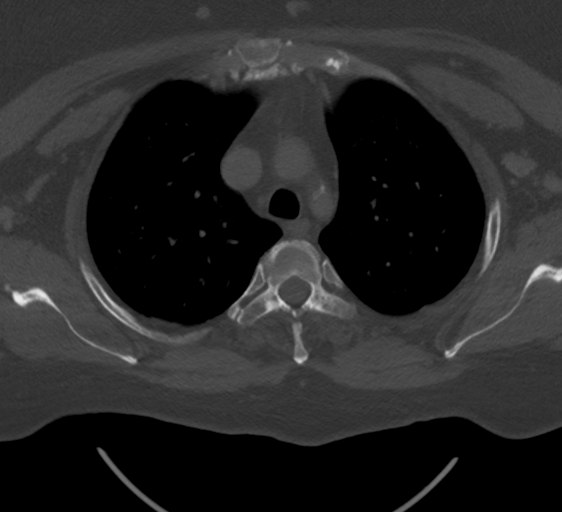
[im 69/161  bone]
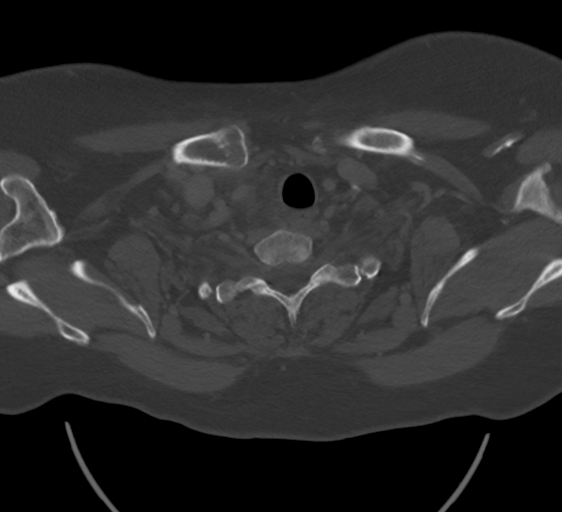
[im 92/161  bone]
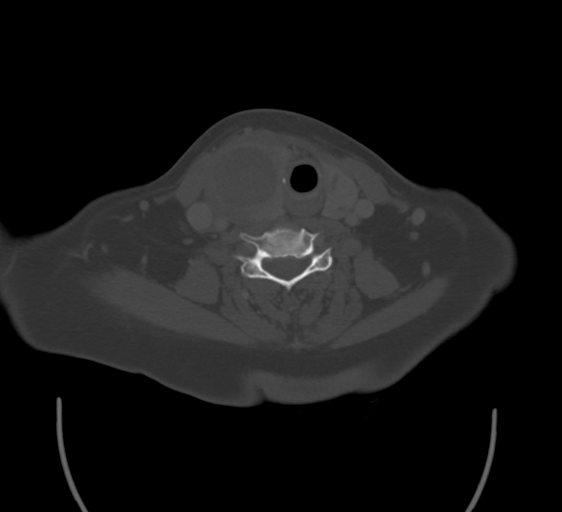
[im 115/161  soft-tissue]
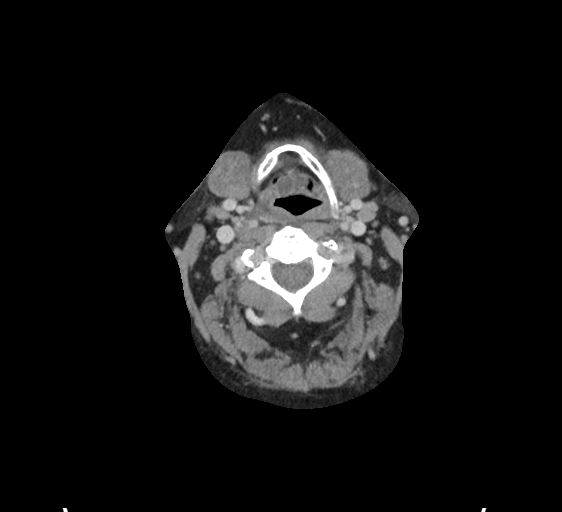
[im 115/161  bone]
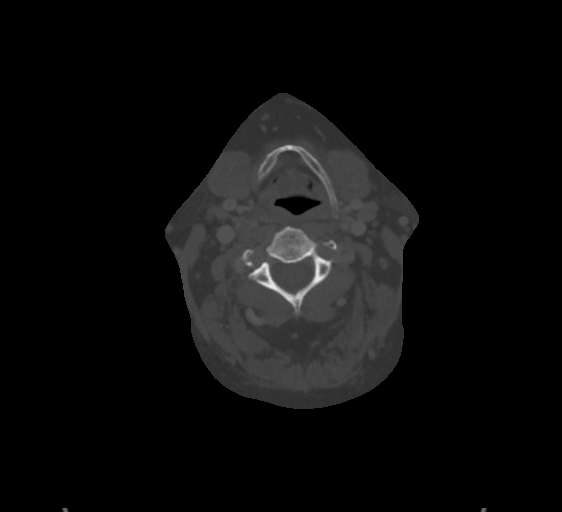
[im 138/161  bone]
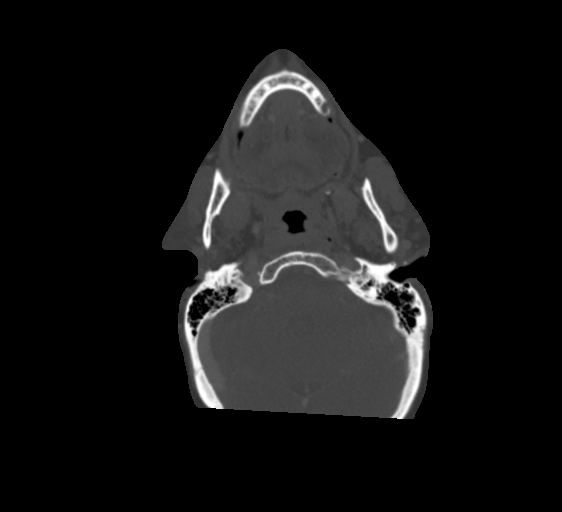

[14 of 33 positions shown; findings below may reference images not displayed]

FINDINGS: Pharynx and larynx: No evidence of a mucosal mass. Patient has a 9
mm cyst associated with the upper surface of the epiglottis in the
vallecular.

Salivary glands: Submandibular glands are normal. Left parotid gland
is normal. 2.3 cm mass as seen previously in the right parotid gland
at the junction of the deep and superficial lobes. As noted
previously, this could be a benign or malignant mass and further
evaluation was and is recommended.

Thyroid: Cystic lesion of the right thyroid gland is slightly
larger, measuring up to 5 cm in diameter. This appears unilocular as
previous. However, there is clearly more soft tissue thickening
surrounding this lesion suggesting inflammatory change. Therefore,
this could be infected.

Lymph nodes: No enlarged or low-density nodes on either side of the
neck.

Vascular: Normal

Limited intracranial: Normal

Visualized orbits: Minimally included.  Normal.

Mastoids and visualized paranasal sinuses: Clear

Skeleton: Ordinary cervical spondylosis.

Upper chest: Mild scarring and emphysematous changes.

Other: None
IMPRESSION: Development soft tissue thickening surrounding the 5 cm thyroid
cystic lesion on the right since the study of 2 weeks ago. This
rapid change suggests infectious inflammation. No evidence of
regional adenopathy.

No change in a 2.3 cm right parotid mass. As noted previously, this
could be benign or malignant and further evaluation is recommended
electively.

## 2020-03-08 ENCOUNTER — Other Ambulatory Visit: Payer: Self-pay | Admitting: Family Medicine

## 2020-03-08 NOTE — Telephone Encounter (Signed)
She should only be on 10 mg of Xarelto now once daily. Please send in the new prescription for 6 months. Thanks.

## 2020-03-08 NOTE — Telephone Encounter (Signed)
Sent in new rx to pharmacy and will notify pt

## 2020-03-08 NOTE — Telephone Encounter (Signed)
Is this okay to refill? 

## 2020-03-09 DIAGNOSIS — L905 Scar conditions and fibrosis of skin: Secondary | ICD-10-CM | POA: Diagnosis not present

## 2020-03-09 DIAGNOSIS — Z85828 Personal history of other malignant neoplasm of skin: Secondary | ICD-10-CM | POA: Diagnosis not present

## 2020-03-09 DIAGNOSIS — L820 Inflamed seborrheic keratosis: Secondary | ICD-10-CM | POA: Diagnosis not present

## 2020-03-09 IMAGING — US ULTRASOUND FNA BIOPSY THYROID 1ST LESION
1 series · 13 of 17 positions shown · non-contrast
Comparison: 03/22/2019

MEDICATIONS:
Lidocaine 1% subcutaneous

COMPLICATIONS:
None immediate.

INDICATION: Indeterminate thyroid nodule

EXAM:
ULTRASOUND GUIDED FINE NEEDLE ASPIRATION OF INDETERMINATE THYROID
NODULE
TECHNIQUE: Informed written consent was obtained from the patient after a
discussion of the risks, benefits and alternatives to treatment.
Questions regarding the procedure were encouraged and answered. A
timeout was performed prior to the initiation of the procedure.

[Series 1: ultrasound fna biopsy thyroid 1st lesion · 17 acquisitions, 13 frames shown]
[im 1/17]
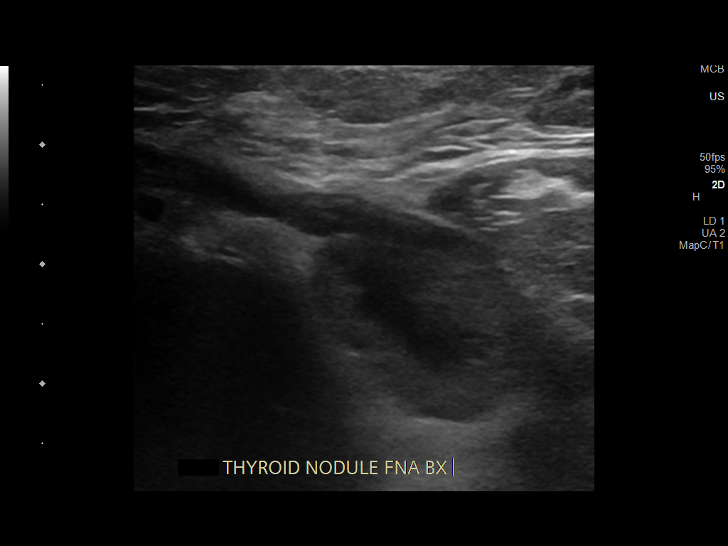
[im 2/17]
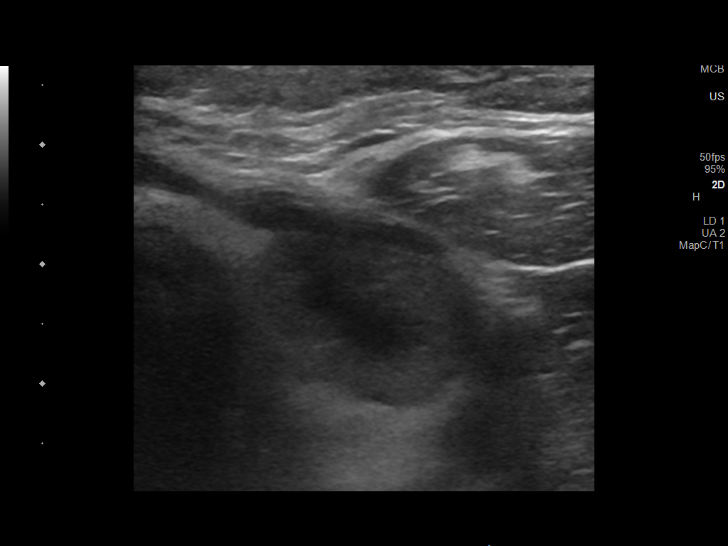
[im 4/17]
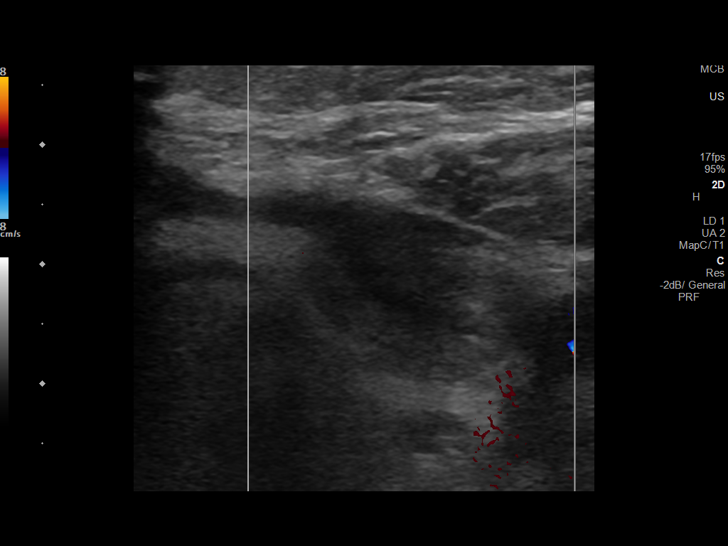
[im 5/17]
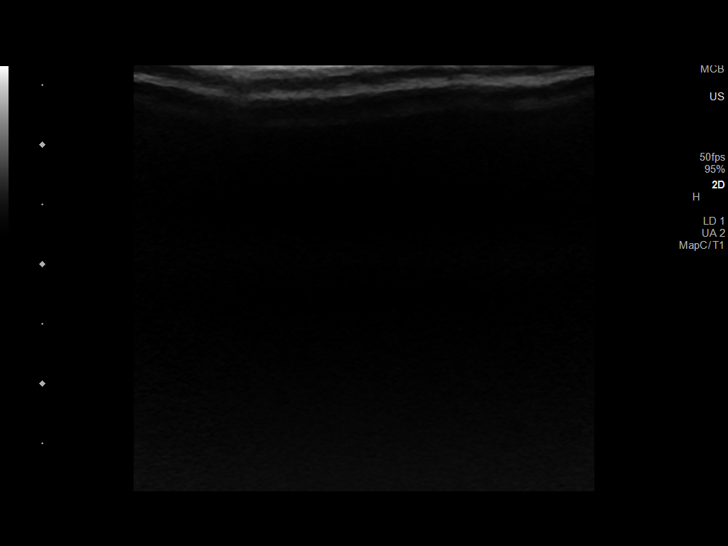
[im 6/17]
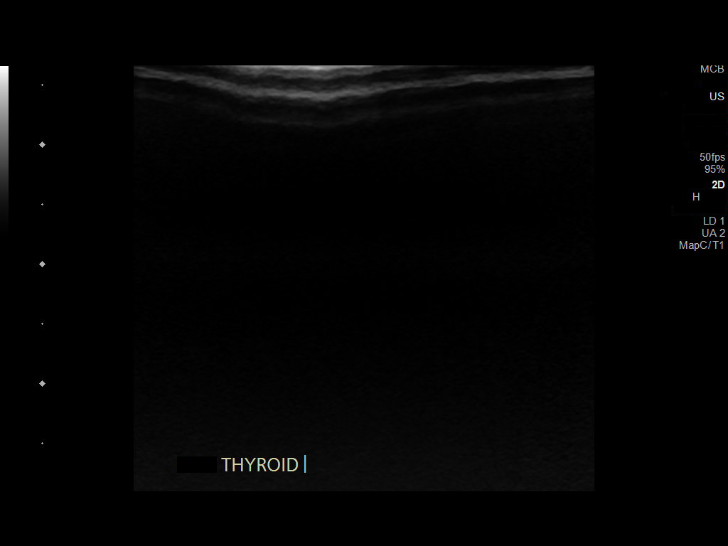
[im 8/17]
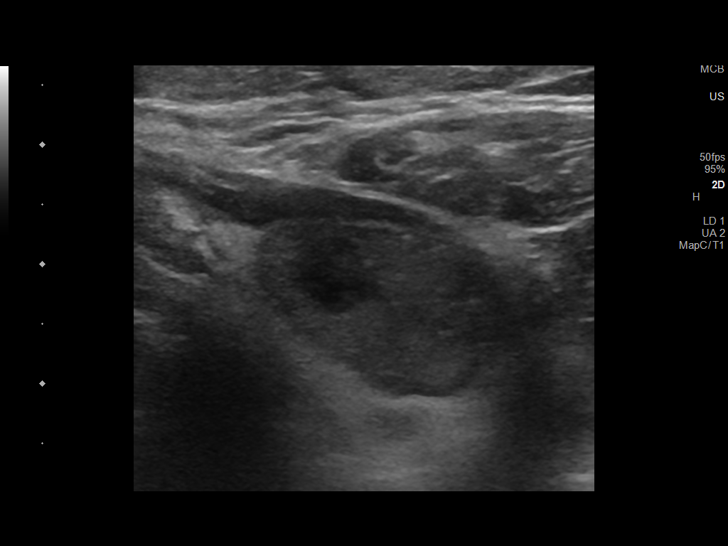
[im 9/17]
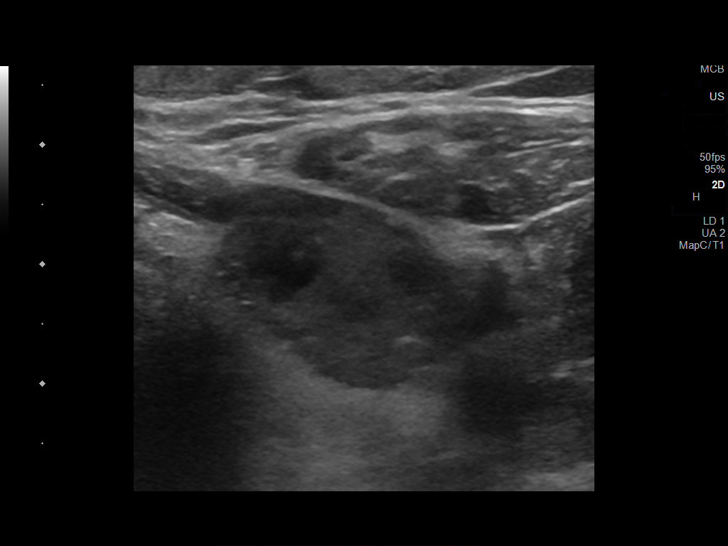
[im 10/17]
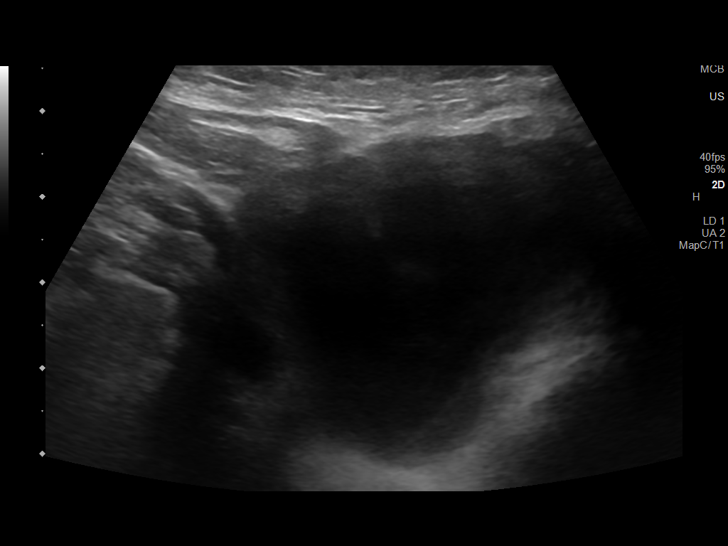
[im 12/17]
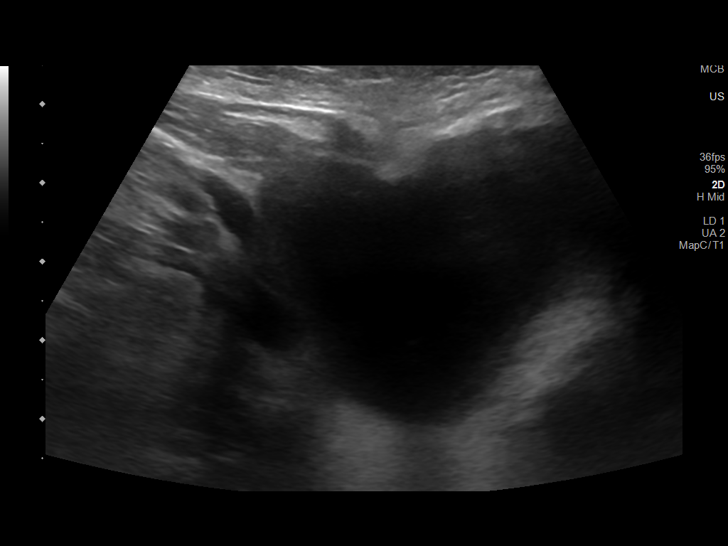
[im 13/17]
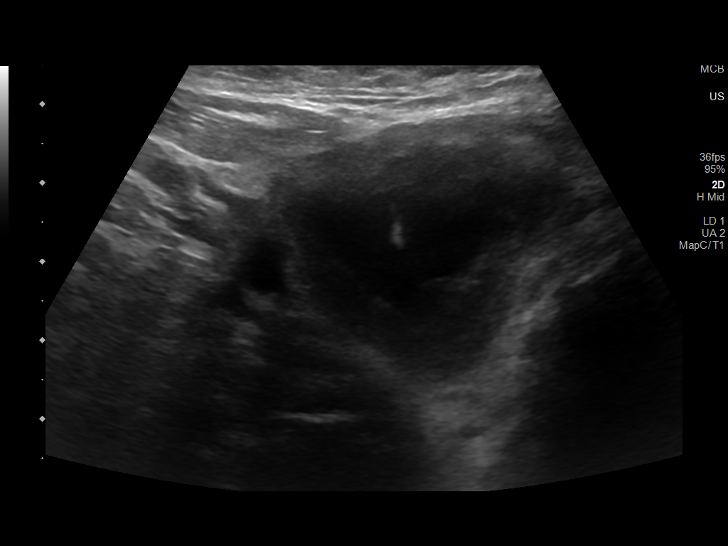
[im 14/17]
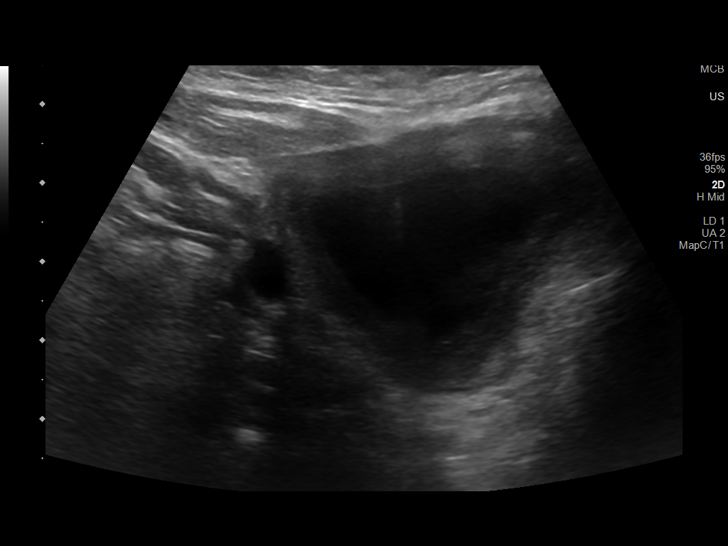
[im 16/17]
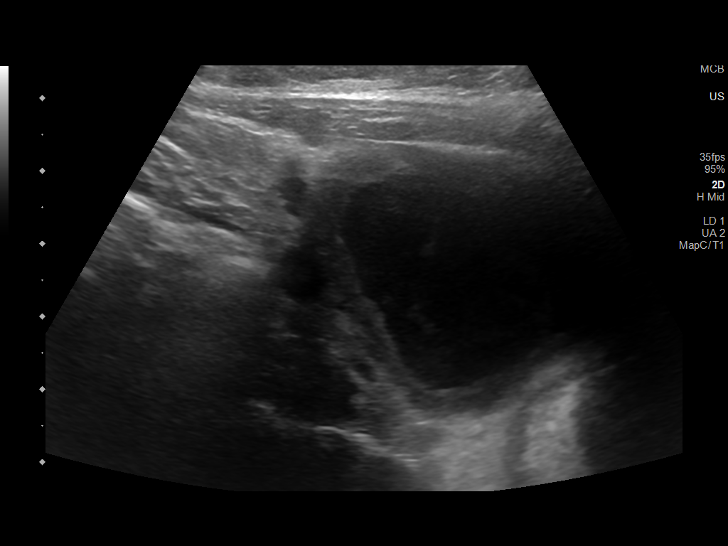
[im 17/17]
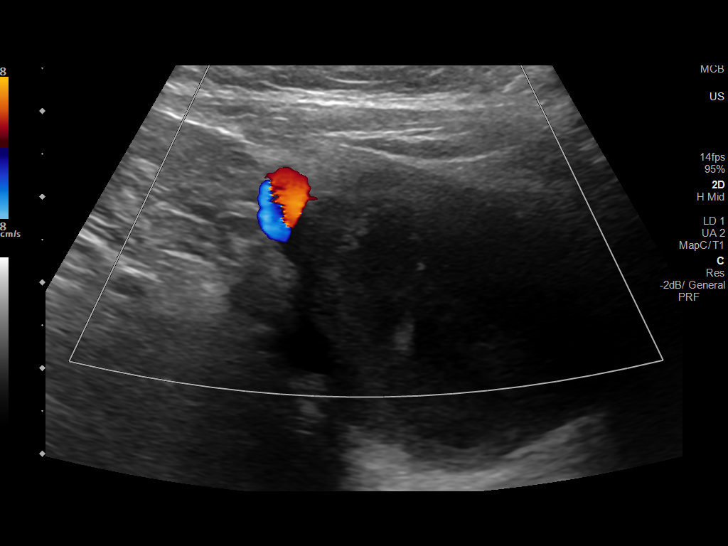

[13 of 17 positions shown; findings below may reference images not displayed]

Pre-procedural ultrasound scanning demonstrated unchanged size and
appearance of the indeterminate nodule within the upper left lobe.

The procedure was planned. The neck was prepped in the usual sterile
fashion, and a sterile drape was applied covering the operative
field. A timeout was performed prior to the initiation of the
procedure. Local anesthesia was provided with 1% lidocaine.

Under direct ultrasound guidance, 5 FNA biopsies were performed of
the superior left nodule with a 25 gauge needle. Multiple ultrasound
images were saved for procedural documentation purposes. The samples
were prepared and submitted to pathology and [facility].

Limited post procedural scanning was negative for hematoma or
additional complication. Dressings were placed. The patient
tolerated the above procedures procedure well without immediate
postprocedural complication.
FINDINGS: Nodule reference number based on prior diagnostic ultrasound: 2

Maximum size: 2.3 cm

Location: Left; Superior

ACR TI-RADS risk category: TR4 (4-6 points)

Reason for biopsy: meets ACR TI-RADS criteria

Ultrasound imaging confirms appropriate placement of the needles
within the thyroid nodule.
IMPRESSION: Technically successful ultrasound guided fine needle aspiration of
superior left thyroid TR4 nodule.

## 2020-03-16 ENCOUNTER — Other Ambulatory Visit: Payer: Self-pay | Admitting: Endocrinology

## 2020-04-17 ENCOUNTER — Telehealth: Payer: Self-pay

## 2020-04-17 ENCOUNTER — Other Ambulatory Visit: Payer: Self-pay

## 2020-04-17 DIAGNOSIS — E119 Type 2 diabetes mellitus without complications: Secondary | ICD-10-CM

## 2020-04-17 DIAGNOSIS — E7800 Pure hypercholesterolemia, unspecified: Secondary | ICD-10-CM

## 2020-04-17 MED ORDER — ATORVASTATIN CALCIUM 10 MG PO TABS: ORAL_TABLET | ORAL | 0 refills | Status: DC

## 2020-04-17 MED ORDER — METFORMIN HCL 500 MG PO TABS: 500.0000 mg | ORAL_TABLET | Freq: Two times a day (BID) | ORAL | 3 refills | Status: DC

## 2020-04-17 MED ORDER — RIVAROXABAN 10 MG PO TABS: 10.0000 mg | ORAL_TABLET | Freq: Every day | ORAL | 0 refills | Status: DC

## 2020-04-17 NOTE — Telephone Encounter (Signed)
Ok to refill Xarelto. Thanks

## 2020-04-17 NOTE — Telephone Encounter (Signed)
Is this okay to refill on xarleto. Metofrmin is to come from [facility]

## 2020-04-17 NOTE — Addendum Note (Signed)
Addended by: Minette Headland A on: 04/17/2020 11:33 AM   Modules accepted: Orders

## 2020-04-17 NOTE — Telephone Encounter (Signed)
Received a fax from [facility] for a refill on the pts. Atorvastatin, metformin, and xarelto pt. Last apt was 01/13/20 and next apt is 10/27/20.

## 2020-04-17 NOTE — Telephone Encounter (Signed)
Sent in both meds to pharmacy

## 2020-04-26 ENCOUNTER — Ambulatory Visit: Payer: Medicare HMO | Admitting: Family Medicine

## 2020-06-12 ENCOUNTER — Other Ambulatory Visit: Payer: Self-pay | Admitting: Family Medicine

## 2020-06-12 NOTE — Telephone Encounter (Signed)
Is this okay to refill? 

## 2020-06-12 NOTE — Telephone Encounter (Signed)
Ok

## 2020-06-21 ENCOUNTER — Encounter: Payer: Self-pay | Admitting: Family Medicine

## 2020-06-21 ENCOUNTER — Ambulatory Visit (INDEPENDENT_AMBULATORY_CARE_PROVIDER_SITE_OTHER): Payer: Medicare HMO | Admitting: Family Medicine

## 2020-06-21 ENCOUNTER — Other Ambulatory Visit: Payer: Self-pay

## 2020-06-21 VITALS — BP 140/80 | HR 79

## 2020-06-21 DIAGNOSIS — F419 Anxiety disorder, unspecified: Secondary | ICD-10-CM

## 2020-06-21 DIAGNOSIS — Z23 Encounter for immunization: Secondary | ICD-10-CM | POA: Diagnosis not present

## 2020-06-21 DIAGNOSIS — Z1389 Encounter for screening for other disorder: Secondary | ICD-10-CM | POA: Diagnosis not present

## 2020-06-21 DIAGNOSIS — L03114 Cellulitis of left upper limb: Secondary | ICD-10-CM

## 2020-06-21 DIAGNOSIS — F32A Depression, unspecified: Secondary | ICD-10-CM | POA: Diagnosis not present

## 2020-06-21 LAB — POCT URINALYSIS DIP (PROADVANTAGE DEVICE)
Bilirubin, UA: NEGATIVE
Blood, UA: NEGATIVE
Glucose, UA: NEGATIVE mg/dL
Ketones, POC UA: NEGATIVE mg/dL
Leukocytes, UA: NEGATIVE
Nitrite, UA: NEGATIVE
Protein Ur, POC: NEGATIVE mg/dL
Specific Gravity, Urine: 1.015
Urobilinogen, Ur: NEGATIVE
pH, UA: 5.5 (ref 5.0–8.0)

## 2020-06-21 MED ORDER — AMOXICILLIN-POT CLAVULANATE 875-125 MG PO TABS: 1.0000 | ORAL_TABLET | Freq: Two times a day (BID) | ORAL | 0 refills | Status: DC
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
## 2020-06-21 NOTE — Patient Instructions (Signed)
Take the antibiotic for your arm.   Follow up with me in one week or sooner if it is getting worse.

## 2020-06-23 ENCOUNTER — Other Ambulatory Visit: Payer: Self-pay | Admitting: Family Medicine

## 2020-06-23 DIAGNOSIS — E7800 Pure hypercholesterolemia, unspecified: Secondary | ICD-10-CM

## 2020-06-27 ENCOUNTER — Telehealth: Payer: Self-pay | Admitting: Family Medicine

## 2020-06-27 NOTE — Telephone Encounter (Signed)
Rhonda Hudson at [facility] [phone], I advised her this patient has no heat or air.  She states this maybe more an Emergency Services need.  [name] will call patient and talk with her and let me know.  If no help there, maybe [facility].
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
## 2020-06-27 NOTE — Telephone Encounter (Signed)
Thank you for trying to help her. I will talk to her again at her visit.

## 2020-06-28 ENCOUNTER — Ambulatory Visit (INDEPENDENT_AMBULATORY_CARE_PROVIDER_SITE_OTHER): Payer: Medicare HMO | Admitting: Family Medicine

## 2020-06-28 ENCOUNTER — Other Ambulatory Visit: Payer: Self-pay

## 2020-06-28 ENCOUNTER — Encounter: Payer: Self-pay | Admitting: Family Medicine

## 2020-06-28 VITALS — BP 120/80 | HR 81

## 2020-06-28 DIAGNOSIS — Z5948 Other specified lack of adequate food: Secondary | ICD-10-CM

## 2020-06-28 DIAGNOSIS — Z7901 Long term (current) use of anticoagulants: Secondary | ICD-10-CM | POA: Diagnosis not present

## 2020-06-28 DIAGNOSIS — F419 Anxiety disorder, unspecified: Secondary | ICD-10-CM

## 2020-06-28 DIAGNOSIS — L03114 Cellulitis of left upper limb: Secondary | ICD-10-CM | POA: Diagnosis not present

## 2020-06-28 DIAGNOSIS — F32A Depression, unspecified: Secondary | ICD-10-CM

## 2020-06-28 DIAGNOSIS — E119 Type 2 diabetes mellitus without complications: Secondary | ICD-10-CM | POA: Diagnosis not present
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
## 2020-07-04 ENCOUNTER — Other Ambulatory Visit: Payer: Self-pay
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
## 2020-07-11 ENCOUNTER — Ambulatory Visit (INDEPENDENT_AMBULATORY_CARE_PROVIDER_SITE_OTHER): Payer: Medicare HMO | Admitting: Endocrinology

## 2020-07-11 ENCOUNTER — Encounter: Payer: Self-pay | Admitting: *Deleted

## 2020-07-11 ENCOUNTER — Other Ambulatory Visit: Payer: Self-pay | Admitting: *Deleted

## 2020-07-11 ENCOUNTER — Telehealth: Payer: Self-pay | Admitting: Endocrinology

## 2020-07-11 ENCOUNTER — Encounter: Payer: Self-pay | Admitting: Endocrinology

## 2020-07-11 ENCOUNTER — Other Ambulatory Visit: Payer: Self-pay

## 2020-07-11 VITALS — BP 160/98 | HR 56 | Ht 68.0 in | Wt 209.0 lb

## 2020-07-11 DIAGNOSIS — E041 Nontoxic single thyroid nodule: Secondary | ICD-10-CM

## 2020-07-11 DIAGNOSIS — E119 Type 2 diabetes mellitus without complications: Secondary | ICD-10-CM

## 2020-07-11 LAB — BASIC METABOLIC PANEL
BUN: 11 mg/dL (ref 6–23)
CO2: 25 mEq/L (ref 19–32)
Calcium: 10.2 mg/dL (ref 8.4–10.5)
Chloride: 102 mEq/L (ref 96–112)
Creatinine, Ser: 0.91 mg/dL (ref 0.40–1.20)
GFR: 65.82 mL/min (ref >60.00)
Glucose, Bld: 95 mg/dL (ref 70–99)
Potassium: 4.1 mEq/L (ref 3.5–5.1)
Sodium: 135 mEq/L (ref 135–145)

## 2020-07-11 LAB — POCT GLYCOSYLATED HEMOGLOBIN (HGB A1C): Hemoglobin A1C: 6.2 % — AB (ref 4.0–5.6)

## 2020-07-11 LAB — T4, FREE: Free T4: 0.84 ng/dL (ref 0.60–1.60)

## 2020-07-11 LAB — TSH: TSH: 1.15 u[IU]/mL (ref 0.35–4.50)

## 2020-07-11 NOTE — Patient Instructions (Signed)
Please continue the same metformin Blood tests are requested for you today.  We'll let you know about the results.  Let's recheck the ultrasound.  you will receive a phone call, about a day and time for an appointment. Please come back for a follow-up appointment in 6 months.
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
## 2020-07-11 NOTE — Progress Notes (Signed)
Subjective:    Patient ID: Rhonda Hudson, female    DOB: [birth year], 64 y.o.   MRN: [id number]  HPI Pt returns for f/u of diabetes mellitus:  DM type: 2 Dx'ed: 9381 Complications: none Therapy: metformin GDM: never DKA: never Severe hypoglycemia: never Pancreatitis: never Pancreatic imaging: normal on 2010 CT Other: she has never taken insulin Interval history: she takes metformin as rx'ed, and tolerates well.   Pt also resturns for f/u of MNG (dx'ed 1981; a cyst was drained then; she then took an uncertain medication x 1 year, but not since; [place] (2020) showed 5.5 cm right lobe cyst; Left upper thyroid nodule (labeled 2) meets criteria for biopsy.  Left lower thyroid nodule (labeled 3) meets criteria for surveillance; Cytology (2020) was cat 1). Pt says the goiter seems unchanged.   Past Medical History:  Diagnosis Date  . Cancer ([place])    skin Ca on phase and back  . Cataracts, bilateral   . Chronic anticoagulation 07/15/2017  . Diabetes mellitus without complication ([place])   . Diverticulitis   . DVT (deep venous thrombosis) ([place])   . DVT of lower extremity (deep venous thrombosis) ([place]) 01/04/2016  . 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 Hiatal hernia with gastroesophageal reflux    disease  . History of prediabetes   . Hyperthyroidism   . Insomnia   . Irritable bowel syndrome   . 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 Panic attacks    Hx of  . Thickened endometrium   . Thyroid nodule 03/03/2019    Past Surgical History:  Procedure Laterality Date  . CHOLECYSTECTOMY    . CYSTOSCOPY  11/04/2019   Procedure: CYSTOSCOPY;  Surgeon: Azucena Fallen, MD;  Location: [facility];  Service: Gynecology;;  . DILATION AND CURETTAGE OF UTERUS N/A 06/16/2018   Procedure: DILATATION AND CURETTAGE;  Surgeon: Emily Filbert, MD;  Location: [facility];  Service: Gynecology;  Laterality: N/A;  . ROBOTIC ASSISTED TOTAL  HYSTERECTOMY WITH BILATERAL SALPINGO OOPHERECTOMY Bilateral 11/04/2019   Procedure: XI ROBOTIC ASSISTED TOTAL HYSTERECTOMY WITH BILATERAL SALPINGO OOPHORECTOMY/Pelvic Washings;  Surgeon: Azucena Fallen, MD;  Location: [facility];  Service: Gynecology;  Laterality: Bilateral;  Tracie to RNFA confirmed on 10/15/19 CS  . TOE SURGERY    . TUBAL LIGATION    . US ECHOCARDIOGRAPHY  01-31-2009   EF 60%    Social History   Socioeconomic History  . Marital status: Divorced    Spouse name: Not on file  . Number of children: 2  . Years of education: 26  . Highest education level: 12th grade  Occupational History  . Occupation: Social Security Disability  Tobacco Use  . Smoking status: Current Every Day Smoker    Packs/day: 0.50    Types: Cigarettes  . Smokeless tobacco: Never Used  Vaping Use  . Vaping Use: Never used  Substance and Sexual Activity  . Alcohol use: No    Alcohol/week: 0.0 standard drinks  . Drug use: No  . Sexual activity: Not Currently    Birth control/protection: Post-menopausal  Other Topics Concern  . Not on file  Social History Narrative   Lives with two sons   Caffeine use: coffee and coke daily   Right-handed   Social Determinants of Health   Financial Resource Strain: High Risk  . Difficulty of Paying Living Expenses: Hard  Food Insecurity: Food Insecurity Present  . Worried About Estate manager/land agent  of Food in the Last Year: Never true  . Ran Out of Food in the Last Year: Often true  Transportation Needs: No Transportation Needs  . Lack of Transportation (Medical): No  . Lack of Transportation (Non-Medical): No  Physical Activity: Inactive  . Days of Exercise per Week: 0 days  . Minutes of Exercise per Session: 0 min  Stress: Stress Concern Present  . Feeling of Stress : Very much  Social Connections: Moderately Isolated  . Frequency of Communication with Friends and Family: More than three times a week  . Frequency of Social Gatherings with  Friends and Family: More than three times a week  . Attends Religious Services: More than 4 times per year  . Active Member of Clubs or Organizations: No  . Attends Archivist Meetings: Never  . Marital Status: Divorced  Human resources officer Violence: Not At Risk  . Fear of Current or Ex-Partner: No  . Emotionally Abused: No  . Physically Abused: No  . Sexually Abused: No    Current Outpatient Medications on File Prior to Visit  Medication Sig Dispense Refill  . acetaminophen (TYLENOL) 500 MG tablet Take 2 tablets (1,000 mg total) by mouth every 6 (six) hours. 30 tablet 0  . atorvastatin (LIPITOR) 10 MG tablet TAKE 1 TABLET(10 MG) BY MOUTH DAILY 90 tablet 0  . metFORMIN (GLUCOPHAGE) 500 MG tablet Take 1 tablet (500 mg total) by mouth 2 (two) times daily with a meal. 90 tablet 3  . XARELTO 10 MG TABS tablet TAKE 1 TABLET EVERY DAY (Patient not taking: Reported on 06/21/2020) 90 tablet 0   No current facility-administered medications on file prior to visit.    No Known Allergies  Family History  Problem Relation Age of Onset  . Diabetes Mother   . Leukemia Father   . Leukemia Sister   . Diabetes Sister   . Leukemia Brother   . Multiple sclerosis Neg Hx   . Autoimmune disease Neg Hx   . Thyroid disease Neg Hx     BP (!) 160/98   Pulse (!) 56   Ht 5\' 8"  (1.727 m)   Wt 209 lb (94.8 kg)   SpO2 98%   BMI 31.78 kg/m    Review of Systems     Objective:   Physical Exam VITAL SIGNS:  See vs page GENERAL: no distress NECK: thyroid is 2-3 times normal size, with multinodular surface  Lab Results  Component Value Date   HGBA1C 6.2 (A) 07/11/2020   Lab Results  Component Value Date   CREATININE 0.84 11/05/2019   BUN 9 11/05/2019   NA 136 11/05/2019   K 4.5 11/05/2019   CL 107 11/05/2019   CO2 20 (L) 11/05/2019       Assessment & Plan:  Type 2 DM: well-controlled MNG: recheck TFT today.  Patient Instructions  Please continue the same metformin Blood  tests are requested for you today.  We'll let you know about the results.  Let's recheck the ultrasound.  you will receive a phone call, about a day and time for an appointment. Please come back for a follow-up appointment in 6 months.

## 2020-07-11 NOTE — Telephone Encounter (Signed)
Informed patient of normal results.

## 2020-07-11 NOTE — Telephone Encounter (Signed)
please contact patient: Normal results

## 2020-07-19 ENCOUNTER — Other Ambulatory Visit: Payer: Self-pay | Admitting: *Deleted

## 2020-07-19 NOTE — Patient Outreach (Signed)
[facility]) Care Management  07/19/2020  CIERRIA HEIGHT [birth year] [id number]  CSW was able to make contact with patient today to follow-up regarding social work services and resources, as well as to check the status of patient's home situation.  Patient reported that she is still without heat, unable to pay for the coolant needed to repair her HVAC system.  Patient stated that she recently paid $500.00 for coolant to load into her HVAC, but quickly learned that she had a leak, draining all of the coolant in less than 24 hours, again leaving her without heat or air conditioning.  Patient went on to explain that several electricians have looked at her HVAC, but that no one has been able to locate the source of the leak.    Patient stated that she is unable to use space heaters or electric heaters, as it is clearly prohibited in her Homeowners policy.  Patient further reported that she is scheduled for eye surgery soon, and that she recently learned that she may also need a thyroidectomy.  Patient admitted that she is extremely concerned about how she will be able to pay all of her monthly bills, in addition to all of her medical expenses.  Patient was agreeable to having CSW submit referrals for financial assistance through the [place], as patient stated, "At this point, I am desperate and will take any help I can get".  Patient is aware that she will be receiving calls from various agencies, with regards to the [facility] referrals, and to try and answer all calls received, even from phone numbers that she does not recognize.  Patient voiced understanding and was agreeable to this plan.  CSW explained to patient that CSW will continue to check out various community agencies and resources that may be able to offer financial assistance, or reduced cost repairs, following up with her as soon as CSW has something to report.  Otherwise, CSW will follow-up with patient again next  week, on Friday, July 28, 2020, around [place], BSW, MSW, [employer]  Licensed Clinical Social Worker  [place]  Mailing [place] [address] [place], [place] [address] Physical Address[address] [place],  [address] Toll Free Main # [phone] Fax # [phone] Cell # [phone]  [email or web address]

## 2020-07-24 ENCOUNTER — Ambulatory Visit
Admission: RE | Admit: 2020-07-24 | Discharge: 2020-07-24 | Disposition: A | Discharge status: Home / Self Care | Payer: Medicare HMO | Source: Ambulatory Visit | Attending: Endocrinology | Admitting: Endocrinology

## 2020-07-24 DIAGNOSIS — E041 Nontoxic single thyroid nodule: Secondary | ICD-10-CM

## 2020-07-25 ENCOUNTER — Other Ambulatory Visit: Payer: Self-pay | Admitting: Endocrinology

## 2020-07-25 DIAGNOSIS — E041 Nontoxic single thyroid nodule: Secondary | ICD-10-CM

## 2020-07-28 ENCOUNTER — Other Ambulatory Visit: Payer: Self-pay | Admitting: *Deleted

## 2020-07-28 NOTE — Patient Outreach (Signed)
[facility]) Care Management  07/28/2020  Rhonda Hudson 19-Jul-1956 [id number]   CSW was able to make contact with patient today to follow-up regarding social work services and resources, as well as to check the status of repairs to her heating and cooling system.  Patient was disappointed to explain to CSW that her son's friend, a licensed, bonded and Control and instrumentation engineer, recently got injured at work, no longer able to offer reduced cost labor for installation of a new coil and coolant.  However, patient's son has agreed to pay for the coolant to replace what leaked out of her current HVAC system, which is the largest portion of the total expense that she was originally quoted.  Not to mention, patient's coil is being replaced by the manufacturer free of charge because it is still under warranty.  Patient indicated that she now really only needs financial assistance to help pay for labor costs.  CSW voiced understanding, agreeing to call several additional electricians to see if they are able to provide any charitable contributions.  CSW explained to patient that CSW will be out-of-the-office for the week of Thanksgiving, scheduling a follow-up conversation with patient for Wednesday, August 16, 2020, around 10:00 AM.  However, patient is aware that CSW will make contact with her in the meantime, if CSW has news to report.  In addition, patient has been encouraged to contact CSW if she needs assistance within the next few weeks, ensuring that she has the correct contact information for CSW.  [name], BSW, MSW, LCSW  Licensed Education officer, environmental Health System  Mailing [place] [address] [place], [place] [address] Physical Address-[address] [place], [place] [address] Toll Free Main # [phone] Fax # [phone] Cell # [phone]  [email or web address]

## 2020-08-03 ENCOUNTER — Ambulatory Visit
Admission: RE | Admit: 2020-08-03 | Discharge: 2020-08-03 | Disposition: A | Discharge status: Home / Self Care | Payer: Medicare HMO | Source: Ambulatory Visit | Attending: Endocrinology | Admitting: Endocrinology

## 2020-08-03 ENCOUNTER — Other Ambulatory Visit (HOSPITAL_COMMUNITY)
Admission: RE | Admit: 2020-08-03 | Discharge: 2020-08-03 | Disposition: A | Discharge status: Home / Self Care | Payer: Medicare HMO | Source: Ambulatory Visit | Attending: Endocrinology | Admitting: Endocrinology

## 2020-08-03 DIAGNOSIS — E041 Nontoxic single thyroid nodule: Secondary | ICD-10-CM

## 2020-08-03 DIAGNOSIS — D34 Benign neoplasm of thyroid gland: Secondary | ICD-10-CM | POA: Insufficient documentation

## 2020-08-04 LAB — CYTOLOGY - NON PAP

## 2020-08-07 ENCOUNTER — Telehealth: Payer: Self-pay

## 2020-08-07 NOTE — Telephone Encounter (Signed)
Patient informed of results.  

## 2020-08-07 NOTE — Telephone Encounter (Signed)
-----   Message from Renato Shin, MD sent at 08/04/2020  7:00 PM EST ----- please contact patient: No cancer is seen--good.  I'll see you next time.

## 2020-08-16 ENCOUNTER — Other Ambulatory Visit: Payer: Self-pay | Admitting: *Deleted

## 2020-08-16 NOTE — Patient Outreach (Signed)
[facility]) Care Management  08/16/2020  Rhonda Hudson 10/11/1955 [id number]   CSW was able to make contact with patient today to follow-up regarding social work services and resources, as well as to check the status of patient's ability to obtain funding/financial assistance to repair her HVAC system.  Patient admitted that she has nothing new to report, encouraging CSW to contact her son, Benjie Karvonen (# [phone]), who has agreed to take on this responsibility on patient's behalf.  CSW made an attempt to try and contact Mr. Madalyn Rob; however, he was unavailable at the time of CSW's call.  CSW left a HIPAA compliant message on voicemail for Mr. Madalyn Rob and is currently awaiting a return call.  Patient encouraged CSW to contact her again at the end of next week, if a return call had not been received by Mr. Madalyn Rob in the meantime.  Per patient's request, CSW will follow-up with her again on Thursday, August 24, 2020, around 10:00AM.  CSW will continue to pursue available community agencies and resources that may be able to offer financial assistance.  [name], BSW, MSW, LCSW  Licensed Education officer, environmental Health System  [address]ailing [place] [address], [place], [place] [address] Physical Address[address], [place], [place] [address] Toll Free Main # [phone] Fax # [phone] Cell # [phone]  [email or web address]

## 2020-08-22 ENCOUNTER — Other Ambulatory Visit: Payer: Self-pay | Admitting: Family Medicine

## 2020-08-23 NOTE — Telephone Encounter (Signed)
Please advise if ok to refill. 

## 2020-08-24 ENCOUNTER — Other Ambulatory Visit: Payer: Self-pay | Admitting: *Deleted
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
## 2020-08-26 DIAGNOSIS — H5213 Myopia, bilateral: Secondary | ICD-10-CM | POA: Diagnosis not present

## 2020-08-26 DIAGNOSIS — H52221 Regular astigmatism, right eye: Secondary | ICD-10-CM | POA: Diagnosis not present

## 2020-08-29 ENCOUNTER — Encounter: Payer: Self-pay | Admitting: *Deleted

## 2020-08-29 ENCOUNTER — Other Ambulatory Visit: Payer: Self-pay | Admitting: *Deleted

## 2020-08-29 NOTE — Patient Outreach (Signed)
[facility]) Care Management  08/29/2020  Rhonda Hudson 1956-08-28 [id number]  CSW was able to make contact with patient today to inform her that CSW's request for financial assistance through the [place] has been unanimously approved.  CSW went on to explain to patient that [name] spoke with [name], Web designer with [place] to request that she fax CSW a copy of the invoice for services, as well as a copy of their W-9.  CSW then e-mailed the invoice and W-9 to [name], Web designer with [facility], to submit to Accounts Payable, who will then cut a check to [name] to pay the remaining balance of $395.50.  Patient was most appreciative of the financial assistance, admitting that she hopes to have heat by the end of the week.  [name] with [name] indicated that they are still waiting for the replacement part (coil) to patient's HVAC System to arrive, then they plan to immediately install and replace coolant.     [name]   Due:08/25/2020 [id number] Amount Due: $395.50               Dear Customer:  Your [phone] for 695.50 is attached. Please remit payment at your earliest convenience.  Thank you for your business - we appreciate it very much.  Sincerely, [name]  [phone] [name]                    CSW will perform a case closure on patient, as all goals of treatment have been met from social work standpoint and no additional social work needs have been identified at this time.  CSW will notify patient's RNCM with [facility], [name] of CSW's plans to close patient's case, as well as route this note.  CSW will fax an update to patient's Primary Care Physician, Dr. Harland Dingwall to ensure that she is aware of  CSW's involvement with patient's plan of care, in addition to sending a Physician Case Closure Letter.  CSW was able to confirm that patient has the correct contact information for CSW, encouraging her to contact CSW directly if additional social work needs arise in the near future.  [name], BSW, MSW, LCSW  Licensed Education officer, environmental Health System  Mailing [place] [address], [place], [place] [address] Physical Address[address] [place], [place] [address] Toll Free Main # [phone] Fax # [phone] Cell # [phone]  [email or web address]

## 2020-08-30 DIAGNOSIS — D485 Neoplasm of uncertain behavior of skin: Secondary | ICD-10-CM | POA: Diagnosis not present

## 2020-08-30 DIAGNOSIS — C44329 Squamous cell carcinoma of skin of other parts of face: Secondary | ICD-10-CM | POA: Diagnosis not present

## 2020-09-01 ENCOUNTER — Ambulatory Visit: Payer: Medicare HMO | Admitting: *Deleted

## 2020-09-13 ENCOUNTER — Other Ambulatory Visit: Payer: Self-pay | Admitting: Endocrinology

## 2020-09-13 DIAGNOSIS — E119 Type 2 diabetes mellitus without complications: Secondary | ICD-10-CM

## 2020-09-26 DIAGNOSIS — C44329 Squamous cell carcinoma of skin of other parts of face: Secondary | ICD-10-CM | POA: Diagnosis not present

## 2020-10-04 ENCOUNTER — Telehealth: Payer: Self-pay | Admitting: Internal Medicine

## 2020-10-04 DIAGNOSIS — E7800 Pure hypercholesterolemia, unspecified: Secondary | ICD-10-CM

## 2020-10-04 MED ORDER — ATORVASTATIN CALCIUM 10 MG PO TABS: ORAL_TABLET | ORAL | 1 refills | Status: DC

## 2020-10-04 NOTE — Telephone Encounter (Signed)
Needs refill on atorvastatin

## 2020-10-26 ENCOUNTER — Other Ambulatory Visit: Payer: Self-pay | Admitting: Family Medicine

## 2020-10-26 NOTE — Progress Notes (Deleted)
Rhonda Hudson is a 65 y.o. female who presents for annual wellness visit and follow-up on chronic medical conditions.  She has the following concerns:  Diabetes and thyroid managed by Dr. Loanne Drilling.      Immunization History  Administered Date(s) Administered  . Fluad Quad(high Dose 65+) 05/12/2019, 06/21/2020  . Influenza,inj,Quad PF,6+ Mos 05/30/2016, 05/14/2017  . Pneumococcal Polysaccharide-23 10/27/2019  . Tdap 01/21/2017   Last Pap smear: Last mammogram: Last colonoscopy: Last DEXA: Dentist: Ophtho: Exercise:  Other doctors caring for patient include:   Depression screen:  See questionnaire below.  Depression screen [facility] 2/9 07/11/2020 07/04/2020 06/21/2020 10/27/2019 01/18/2019  Decreased Interest 2 3 3  0 0  Down, Depressed, Hopeless 1 3 3  0 0  PHQ - 2 Score 3 6 6  0 0  Altered sleeping 3 3 3  - -  Tired, decreased energy 0 0 0 - -  Change in appetite 0 0 0 - -  Feeling bad or failure about yourself  1 3 3  - -  Trouble concentrating 0 0 0 - -  Moving slowly or fidgety/restless 0 0 0 - -  Suicidal thoughts 0 0 0 - -  PHQ-9 Score 7 12 12  - -  Difficult doing work/chores Not difficult at all Not difficult at all Not difficult at all - -    Fall Risk Screen: see questionnaire below. Fall Risk  07/11/2020 07/11/2020 06/21/2020 10/27/2019 01/18/2019  Falls in the past year? 1 1 1 1  0  Number falls in past yr: 1 1 1 1  0  Injury with Fall? 0 0 0 1 0  Risk for fall due to : History of fall(s);Impaired balance/gait;Impaired mobility - - Impaired balance/gait -  Follow up Education provided;Falls prevention discussed - - - -    ADL screen:  See questionnaire below Functional Status Survey:     End of Life Discussion:  Patient {ACTIONS; HAS/DOES NOT HAVE:19233} a living will and medical power of attorney  Review of Systems Constitutional: -fever, -chills, -sweats, -unexpected weight change, -anorexia, -fatigue Allergy: -sneezing, -itching, -congestion Dermatology: denies  changing moles, rash, lumps, new worrisome lesions ENT: -runny nose, -ear pain, -sore throat, -hoarseness, -sinus pain, -teeth pain, -tinnitus, -hearing loss, -epistaxis Cardiology:  -chest pain, -palpitations, -edema, -orthopnea, -paroxysmal nocturnal dyspnea Respiratory: -cough, -shortness of breath, -dyspnea on exertion, -wheezing, -hemoptysis Gastroenterology: -abdominal pain, -nausea, -vomiting, -diarrhea, -constipation, -blood in stool, -changes in bowel movement, -dysphagia Hematology: -bleeding or bruising problems Musculoskeletal: -arthralgias, -myalgias, -joint swelling, -back pain, -neck pain, -cramping, -gait changes Ophthalmology: -vision changes, -eye redness, -itching, -discharge Urology: -dysuria, -difficulty urinating, -hematuria, -urinary frequency, -urgency, incontinence Neurology: -headache, -weakness, -tingling, -numbness, -speech abnormality, -memory loss, -falls, -dizziness Psychology:  -depressed mood, -agitation, -sleep problems    PHYSICAL EXAM:  There were no vitals taken for this visit.  General Appearance: Alert, cooperative, no distress, appears stated age Head: Normocephalic, without obvious abnormality, atraumatic Eyes: PERRL, conjunctiva/corneas clear, EOM's intact, fundi benign Ears: Normal TM's and external ear canals Nose: Nares normal, mucosa normal, no drainage or sinus   tenderness Throat: Lips, mucosa, and tongue normal; teeth and gums normal Neck: Supple, no lymphadenopathy; thyroid: no enlargement/tenderness/nodules; no carotid bruit or JVD Back: Spine nontender, no curvature, ROM normal, no CVA tenderness Lungs: Clear to auscultation bilaterally without wheezes, rales or ronchi; respirations unlabored Chest Wall: No tenderness or deformity Heart: Regular rate and rhythm, S1 and S2 normal, no murmur, rub or gallop Breast Exam: No tenderness, masses, or nipple discharge or inversion. No axillary lymphadenopathy Abdomen:  Soft, non-tender,  nondistended, normoactive bowel sounds, no masses, no hepatosplenomegaly Genitalia: Normal external genitalia without lesions.  BUS and vagina normal; cervix without lesions, or cervical motion tenderness. No abnormal vaginal discharge.  Uterus and adnexa not enlarged, nontender, no masses.  Pap performed Rectal: Normal tone, no masses or tenderness; guaiac negative stool Extremities: No clubbing, cyanosis or edema Pulses: 2+ and symmetric all extremities Skin: Skin color, texture, turgor normal, no rashes or lesions Lymph nodes: Cervical, supraclavicular, and axillary nodes normal Neurologic: CNII-XII intact, normal strength, sensation and gait; reflexes 2+ and symmetric throughout Psych: Normal mood, affect, hygiene and grooming.  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  Immunization recommendations discussed.  Colonoscopy recommendations reviewed   Medicare Attestation I have personally reviewed: The patient's medical and social history Their use of alcohol, tobacco or illicit drugs Their current medications and supplements The patient's functional ability including ADLs,fall risks, home safety risks, cognitive, and hearing and visual impairment Diet and physical activities Evidence for depression or mood  disorders  The patient's weight, height, and BMI have been recorded in the chart.  I have made referrals, counseling, and provided education to the patient based on review of the above and I have provided the patient with a written personalized care plan for preventive services.     Harland Dingwall, NP-C   10/26/2020

## 2020-10-26 NOTE — Telephone Encounter (Signed)
Is this okay to refill? 

## 2020-10-26 NOTE — Telephone Encounter (Signed)
ok 

## 2020-10-27 ENCOUNTER — Ambulatory Visit: Payer: Medicare HMO | Admitting: Family Medicine

## 2020-10-27 DIAGNOSIS — Z5320 Procedure and treatment not carried out because of patient's decision for unspecified reasons: Secondary | ICD-10-CM

## 2020-10-27 DIAGNOSIS — F17200 Nicotine dependence, unspecified, uncomplicated: Secondary | ICD-10-CM

## 2020-10-27 DIAGNOSIS — I82592 Chronic embolism and thrombosis of other specified deep vein of left lower extremity: Secondary | ICD-10-CM

## 2020-10-27 DIAGNOSIS — Z7901 Long term (current) use of anticoagulants: Secondary | ICD-10-CM

## 2020-10-27 DIAGNOSIS — Z7189 Other specified counseling: Secondary | ICD-10-CM

## 2020-10-27 DIAGNOSIS — Z0000 Encounter for general adult medical examination without abnormal findings: Secondary | ICD-10-CM

## 2020-11-01 NOTE — Progress Notes (Signed)
Rhonda Hudson is a 65 y.o. female who presents for annual wellness visit, CPE and follow-up on chronic medical conditions.  She has the following concerns:  States she needs a new ENT. States she was not treated well by the front office staff at [facility] in the past few weeks. History of parotid mass.  States her parotid glands are bothering her. She reports tenderness and pain occasionally with certain neck or jaw movements.   States she has been taking 1 Tylenol PM some nights to help with sleep.   States she has cut back and her smoking from 1ppd to 1/2 ppd. She also reports reducing soda and sweets.   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 Denies bleeding but reports bruising easily.    Immunization History  Administered Date(s) Administered  . Fluad Quad(high Dose 65+) 05/12/2019, 06/21/2020  . Influenza,inj,Quad PF,6+ Mos 05/30/2016, 05/14/2017  . Pneumococcal Polysaccharide-23 10/27/2019  . Tdap 01/21/2017   Last Pap smear: hysterectomy a year ago Last mammogram:  2017 Last colonoscopy: denied Last DEXA: denied Dentist: in the past 6 months  Ophtho: Dr. Delman Cheadle  Exercise: walking when weather permits  Other doctors caring for patient include: Dr. Loanne Drilling -endocrinology manages diabetes and thyroid  Dr. Sanjuana Kava   Dr. Jaynee Eagles- neurologist Dr. Talbert Cage- heme/onc Dr. Delia Chimes (will be seeing someone else as he is retiring - [facility]  Dr. Allyson Sabal  [facility] [place].  Dr. Prudencio Burly- eye surgeon  Dr. Delman Cheadle- eyes     Depression screen:  See questionnaire below.  Depression screen [facility] 2/9 11/02/2020 07/11/2020 07/04/2020 06/21/2020 10/27/2019  Decreased Interest 0 2 3 3  0  Down, Depressed, Hopeless 0 1 3 3  0  PHQ - 2 Score 0 3 6 6  0  Altered sleeping - 3 3 3  -  Tired, decreased energy - 0 0 0 -  Change in appetite - 0 0 0 -  Feeling bad or failure about yourself  - 1 3 3  -  Trouble concentrating - 0 0 0 -   Moving slowly or fidgety/restless - 0 0 0 -  Suicidal thoughts - 0 0 0 -  PHQ-9 Score - 7 12 12  -  Difficult doing work/chores - Not difficult at all Not difficult at all Not difficult at all -    Fall Risk Screen: see questionnaire below. Fall Risk  11/02/2020 07/11/2020 07/11/2020 06/21/2020 10/27/2019  Falls in the past year? 1 1 1 1 1   Number falls in past yr: 0 1 1 1 1   Injury with Fall? 0 0 0 0 1  Risk for fall due to : - History of fall(s);Impaired balance/gait;Impaired mobility - - Impaired balance/gait  Follow up - Education provided;Falls prevention discussed - - -    ADL screen:  See questionnaire below Functional Status Survey: Is the patient deaf or have difficulty hearing?: No Does the patient have difficulty seeing, even when wearing glasses/contacts?: Yes Does the patient have difficulty concentrating, remembering, or making decisions?: Yes (sometimes) Does the patient have difficulty walking or climbing stairs?: No Does the patient have difficulty dressing or bathing?: No Does the patient have difficulty doing errands alone such as visiting a doctor's office or shopping?: No   End of Life Discussion: Patient does not have a living will and medical power of attorney. Her best friend [name] is who should be contacted in case of emergency. [phone] Declines to fill out MOST form today. Paperwork given for living will  and HCPOA and she will take this home to consider filling out.  Her nephew and Santiago Glad live in [place].   Review of Systems Constitutional: -fever, -chills, -sweats, -unexpected weight change, -anorexia, -fatigue Allergy: -sneezing, -itching, -congestion Dermatology: denies changing moles, rash, lumps, new worrisome lesions ENT: -runny nose, -ear pain, -sore throat, -hoarseness, -sinus pain, -teeth pain, -tinnitus, -hearing loss, -epistaxis Cardiology:  -chest pain, -palpitations, -edema, -orthopnea, -paroxysmal nocturnal dyspnea Respiratory: -cough,  -shortness of breath, -dyspnea on exertion, -wheezing, -hemoptysis Gastroenterology: -abdominal pain, -nausea, -vomiting, -diarrhea, -constipation, -blood in stool, -changes in bowel movement, ?dysphagia Hematology: -bleeding or bruising problems Musculoskeletal: -arthralgias, -myalgias, -joint swelling, -back pain, -neck pain, -cramping, -gait changes Ophthalmology: -vision changes, -eye redness, -itching, -discharge Urology: -dysuria, -difficulty urinating, -hematuria, -urinary frequency, -urgency,- incontinence Neurology: -headache, -weakness, -tingling, -numbness, -speech abnormality, -memory loss, -falls, -dizziness Psychology:  -depressed mood, -agitation, -sleep problems    PHYSICAL EXAM:  BP 120/70   Pulse 87   Ht 5\' 8"  (1.727 m)   Wt 201 lb (91.2 kg) Comment: per pt  BMI 30.56 kg/m   General Appearance: Alert, cooperative, no distress, appears stated age Head: Normocephalic, without obvious abnormality, atraumatic Eyes: PERRL, conjunctiva/corneas clear, EOM's intact Ears: Normal TM's and external ear canals Nose: mask on  Throat: mask on  Neck: Supple, no lymphadenopathy. TTP to bilateral anterior neck Back: Spine nontender, ROM normal, no CVA tenderness Lungs: Clear to auscultation bilaterally without wheezes, rales or ronchi; respirations unlabored Chest Wall: No tenderness or deformity Heart: Regular rate and rhythm, S1 and S2 normal, no murmur, rub or gallop Breast Exam: declines  Abdomen: Soft, non-tender, nondistended, normoactive bowel sounds  Genitalia: refuses  Extremities: No clubbing, cyanosis or edema Pulses: 2+ and symmetric all extremities Skin: Skin color, texture, turgor normal, no rashes or lesions Lymph nodes: Cervical, supraclavicular nodes normal Neurologic: CNII-XII intact, normal strength, sensation and gait Psych: Normal mood, affect, hygiene and grooming.  ASSESSMENT/PLAN: Medicare annual wellness visit, subsequent -Denies any difficulty  with ADLs, depression, memory and no recent falls.  Reviewed medications and other providers.  She sees several specialists.  Counseling on advanced directives.  She declines to fill these out.  Routine general medical examination at a health care facility - Plan: CBC with Differential/Platelet, Comprehensive metabolic panel -Preventive health care reviewed.  She refuses mammogram, bone density, pelvic exam.  She also refuses colonoscopy.  She is aware that she could have underlying cancer.  Congratulated her on cutting back on smoking.  Encouraged healthy diet and exercise.  Recommend regular dental and eye exams.  Immunizations reviewed.  Discussed safety and health promotion.  Chronic anticoagulation -She will need to continue this indefinitely per hematology.  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  Request referral to a new ENT today.  Mass of right parotid gland - Plan: Ambulatory referral to ENT  Elevated LDL cholesterol level - Plan: Lipid panel -Recommend low-fat diet.  Continue statin therapy.  Follow-up pending lipid panel results     Discussed monthly self breast exams and yearly mammograms; at least 30 minutes of aerobic activity at least 5 days/week and weight-bearing  exercise 2x/week; proper sunscreen use reviewed; healthy diet, including goals of calcium and vitamin D intake and alcohol recommendations (less than or equal to 1 drink/day) reviewed; regular seatbelt use; changing batteries in smoke detectors.  Immunization recommendations discussed.  Colonoscopy recommendations  reviewed   Medicare Attestation I have personally reviewed: The patient's medical and social history Their use of alcohol, tobacco or illicit drugs Their current medications and supplements The patient's functional ability including ADLs,fall risks, home safety risks, cognitive, and hearing and visual impairment Diet and physical activities Evidence for depression or mood disorders  The patient's weight, height, and BMI have been recorded in the chart.  I have made referrals, counseling, and provided education to the patient based on review of the above and I have provided the patient with a written personalized care plan for preventive services.     Harland Dingwall, NP-C   11/05/2020

## 2020-11-02 ENCOUNTER — Other Ambulatory Visit: Payer: Self-pay

## 2020-11-02 ENCOUNTER — Encounter: Payer: Self-pay | Admitting: Family Medicine

## 2020-11-02 ENCOUNTER — Ambulatory Visit (INDEPENDENT_AMBULATORY_CARE_PROVIDER_SITE_OTHER): Payer: Medicare HMO | Admitting: Family Medicine

## 2020-11-02 VITALS — BP 120/70 | HR 87 | Ht 68.0 in | Wt 201.0 lb

## 2020-11-02 DIAGNOSIS — R1310 Dysphagia, unspecified: Secondary | ICD-10-CM

## 2020-11-02 DIAGNOSIS — Z5320 Procedure and treatment not carried out because of patient's decision for unspecified reasons: Secondary | ICD-10-CM | POA: Diagnosis not present

## 2020-11-02 DIAGNOSIS — F17200 Nicotine dependence, unspecified, uncomplicated: Secondary | ICD-10-CM

## 2020-11-02 DIAGNOSIS — E7800 Pure hypercholesterolemia, unspecified: Secondary | ICD-10-CM

## 2020-11-02 DIAGNOSIS — K118 Other diseases of salivary glands: Secondary | ICD-10-CM

## 2020-11-02 DIAGNOSIS — Z7901 Long term (current) use of anticoagulants: Secondary | ICD-10-CM

## 2020-11-02 DIAGNOSIS — D682 Hereditary deficiency of other clotting factors: Secondary | ICD-10-CM

## 2020-11-02 DIAGNOSIS — Z0000 Encounter for general adult medical examination without abnormal findings: Secondary | ICD-10-CM | POA: Diagnosis not present

## 2020-11-02 NOTE — Patient Instructions (Signed)
  Ms. Alguire , Thank you for taking time to come for your Medicare Wellness Visit. I appreciate your ongoing commitment to your health goals. Please review the following plan we discussed and let me know if I can assist you in the future.   These are the goals we discussed:  I am referring you to a new ENT and they will call you to schedule a visit. Let me know if you have not heard in 2 weeks.   Call and schedule with your eye doctor Dr. Delman Cheadle.   See Dr. Loanne Drilling as scheduled.         Health Maintenance  Topic Date Due  . Eye exam for diabetics  Never done  . COVID-19 Vaccine (1) Never done  . Colon Cancer Screening  Never done  . Mammogram  02/26/2017  . DEXA scan (bone density measurement)  Never done  . Urine Protein Check  01/20/2020  . Pneumonia vaccines (2 of 2 - PCV13) 10/26/2020  . Hemoglobin A1C  01/09/2021  . Complete foot exam   07/11/2021  . Tetanus Vaccine  01/22/2027  . Flu Shot  Completed  .  Hepatitis C: One time screening is recommended by Center for Disease Control  (CDC) for  adults born from [birth year] through 1967.   Completed

## 2020-11-03 LAB — COMPREHENSIVE METABOLIC PANEL
ALT: 20 IU/L (ref 0–32)
AST: 13 IU/L (ref 0–40)
Albumin/Globulin Ratio: 1.4 (ref 1.2–2.2)
Albumin: 4.3 g/dL (ref 3.8–4.8)
Alkaline Phosphatase: 96 IU/L (ref 44–121)
BUN/Creatinine Ratio: 13 (ref 12–28)
BUN: 10 mg/dL (ref 8–27)
Bilirubin Total: 0.3 mg/dL (ref 0.0–1.2)
CO2: 20 mmol/L (ref 20–29)
Calcium: 10 mg/dL (ref 8.7–10.3)
Chloride: 104 mmol/L (ref 96–106)
Creatinine, Ser: 0.8 mg/dL (ref 0.57–1.00)
GFR calc Af Amer: 89 mL/min/{1.73_m2} (ref >59)
GFR calc non Af Amer: 77 mL/min/{1.73_m2} (ref >59)
Globulin, Total: 3.1 g/dL (ref 1.5–4.5)
Glucose: 113 mg/dL — ABNORMAL HIGH (ref 65–99)
Potassium: 4.4 mmol/L (ref 3.5–5.2)
Sodium: 140 mmol/L (ref 134–144)
Total Protein: 7.4 g/dL (ref 6.0–8.5)

## 2020-11-03 LAB — CBC WITH DIFFERENTIAL/PLATELET
Basophils Absolute: 0.1 10*3/uL (ref 0.0–0.2)
Basos: 1 %
EOS (ABSOLUTE): 0.1 10*3/uL (ref 0.0–0.4)
Eos: 1 %
Hematocrit: 45.8 % (ref 34.0–46.6)
Hemoglobin: 15.8 g/dL (ref 11.1–15.9)
Immature Grans (Abs): 0 10*3/uL (ref 0.0–0.1)
Immature Granulocytes: 0 %
Lymphocytes Absolute: 3.2 10*3/uL — ABNORMAL HIGH (ref 0.7–3.1)
Lymphs: 28 %
MCH: 29.6 pg (ref 26.6–33.0)
MCHC: 34.5 g/dL (ref 31.5–35.7)
MCV: 86 fL (ref 79–97)
Monocytes Absolute: 0.7 10*3/uL (ref 0.1–0.9)
Monocytes: 6 %
Neutrophils Absolute: 7.3 10*3/uL — ABNORMAL HIGH (ref 1.4–7.0)
Neutrophils: 64 %
Platelets: 243 10*3/uL (ref 150–450)
RBC: 5.34 x10E6/uL — ABNORMAL HIGH (ref 3.77–5.28)
RDW: 13.6 % (ref 11.7–15.4)
WBC: 11.3 10*3/uL — ABNORMAL HIGH (ref 3.4–10.8)

## 2020-11-03 LAB — LIPID PANEL
Chol/HDL Ratio: 3.3 ratio (ref 0.0–4.4)
Cholesterol, Total: 168 mg/dL (ref 100–199)
HDL: 51 mg/dL (ref >39)
LDL Chol Calc (NIH): 89 mg/dL (ref 0–99)
Triglycerides: 162 mg/dL — ABNORMAL HIGH (ref 0–149)
VLDL Cholesterol Cal: 28 mg/dL (ref 5–40)

## 2020-11-03 NOTE — Progress Notes (Signed)
Her labs are fine. Continue current medications.

## 2020-11-09 ENCOUNTER — Encounter: Payer: Self-pay | Admitting: Family Medicine

## 2020-11-10 ENCOUNTER — Encounter (HOSPITAL_COMMUNITY): Payer: Self-pay | Admitting: Emergency Medicine

## 2020-11-10 ENCOUNTER — Telehealth: Payer: Self-pay | Admitting: Internal Medicine

## 2020-11-10 ENCOUNTER — Other Ambulatory Visit: Payer: Self-pay

## 2020-11-10 ENCOUNTER — Emergency Department (HOSPITAL_COMMUNITY): Payer: Medicare HMO

## 2020-11-10 ENCOUNTER — Encounter: Payer: Self-pay | Admitting: Family Medicine

## 2020-11-10 ENCOUNTER — Ambulatory Visit (INDEPENDENT_AMBULATORY_CARE_PROVIDER_SITE_OTHER): Payer: Medicare HMO | Admitting: Family Medicine

## 2020-11-10 ENCOUNTER — Emergency Department (HOSPITAL_COMMUNITY)
Admission: EM | Admit: 2020-11-10 | Discharge: 2020-11-10 | Disposition: A | Discharge status: Home / Self Care | Payer: Medicare HMO | Attending: Emergency Medicine | Admitting: Emergency Medicine

## 2020-11-10 VITALS — BP 150/92 | HR 86 | Temp 97.7°F

## 2020-11-10 DIAGNOSIS — Z85828 Personal history of other malignant neoplasm of skin: Secondary | ICD-10-CM | POA: Insufficient documentation

## 2020-11-10 DIAGNOSIS — R531 Weakness: Secondary | ICD-10-CM

## 2020-11-10 DIAGNOSIS — F17210 Nicotine dependence, cigarettes, uncomplicated: Secondary | ICD-10-CM | POA: Insufficient documentation

## 2020-11-10 DIAGNOSIS — R2990 Unspecified symptoms and signs involving the nervous system: Secondary | ICD-10-CM

## 2020-11-10 DIAGNOSIS — E119 Type 2 diabetes mellitus without complications: Secondary | ICD-10-CM | POA: Insufficient documentation

## 2020-11-10 DIAGNOSIS — R42 Dizziness and giddiness: Secondary | ICD-10-CM | POA: Diagnosis not present

## 2020-11-10 DIAGNOSIS — R270 Ataxia, unspecified: Secondary | ICD-10-CM | POA: Diagnosis not present

## 2020-11-10 DIAGNOSIS — Z86718 Personal history of other venous thrombosis and embolism: Secondary | ICD-10-CM | POA: Insufficient documentation

## 2020-11-10 DIAGNOSIS — Z79899 Other long term (current) drug therapy: Secondary | ICD-10-CM | POA: Insufficient documentation

## 2020-11-10 DIAGNOSIS — M6281 Muscle weakness (generalized): Secondary | ICD-10-CM | POA: Insufficient documentation

## 2020-11-10 DIAGNOSIS — R262 Difficulty in walking, not elsewhere classified: Secondary | ICD-10-CM | POA: Diagnosis not present

## 2020-11-10 DIAGNOSIS — R110 Nausea: Secondary | ICD-10-CM | POA: Insufficient documentation

## 2020-11-10 DIAGNOSIS — I10 Essential (primary) hypertension: Secondary | ICD-10-CM | POA: Diagnosis not present

## 2020-11-10 DIAGNOSIS — R29810 Facial weakness: Secondary | ICD-10-CM | POA: Diagnosis not present

## 2020-11-10 LAB — CBC WITH DIFFERENTIAL/PLATELET
Abs Immature Granulocytes: 0.05 K/uL (ref 0.00–0.07)
Basophils Absolute: 0.1 K/uL (ref 0.0–0.1)
Basophils Relative: 1 %
Eosinophils Absolute: 0.1 K/uL (ref 0.0–0.5)
Eosinophils Relative: 1 %
HCT: 45.2 % (ref 36.0–46.0)
Hemoglobin: 14.8 g/dL (ref 12.0–15.0)
Immature Granulocytes: 0 %
Lymphocytes Relative: 24 %
Lymphs Abs: 3.2 K/uL (ref 0.7–4.0)
MCH: 28.7 pg (ref 26.0–34.0)
MCHC: 32.7 g/dL (ref 30.0–36.0)
MCV: 87.8 fL (ref 80.0–100.0)
Monocytes Absolute: 0.8 K/uL (ref 0.1–1.0)
Monocytes Relative: 6 %
Neutro Abs: 9 K/uL — ABNORMAL HIGH (ref 1.7–7.7)
Neutrophils Relative %: 68 %
Platelets: 219 K/uL (ref 150–400)
RBC: 5.15 MIL/uL — ABNORMAL HIGH (ref 3.87–5.11)
RDW: 13.6 % (ref 11.5–15.5)
WBC: 13.2 K/uL — ABNORMAL HIGH (ref 4.0–10.5)
nRBC: 0 % (ref 0.0–0.2)

## 2020-11-10 LAB — BASIC METABOLIC PANEL
Anion gap: 11 (ref 5–15)
BUN: 8 mg/dL (ref 8–23)
CO2: 23 mmol/L (ref 22–32)
Calcium: 9.8 mg/dL (ref 8.9–10.3)
Chloride: 102 mmol/L (ref 98–111)
Creatinine, Ser: 0.79 mg/dL (ref 0.44–1.00)
GFR, Estimated: >60 mL/min (ref >60)
Glucose, Bld: 112 mg/dL — ABNORMAL HIGH (ref 70–99)
Potassium: 3.8 mmol/L (ref 3.5–5.1)
Sodium: 136 mmol/L (ref 135–145)

## 2020-11-10 LAB — URINALYSIS, ROUTINE W REFLEX MICROSCOPIC
Bilirubin Urine: NEGATIVE
Glucose, UA: NEGATIVE mg/dL
Hgb urine dipstick: NEGATIVE
Ketones, ur: NEGATIVE mg/dL
Leukocytes,Ua: NEGATIVE
Nitrite: NEGATIVE
Protein, ur: NEGATIVE mg/dL
Specific Gravity, Urine: 1.008 (ref 1.005–1.030)
pH: 6 (ref 5.0–8.0)

## 2020-11-10 LAB — GLUCOSE, POCT (MANUAL RESULT ENTRY): POC Glucose: 143 mg/dl — AB (ref 70–99)

## 2020-11-10 MED ORDER — MECLIZINE HCL 25 MG PO TABS: 25.0000 mg | ORAL_TABLET | Freq: Three times a day (TID) | ORAL | 0 refills | Status: DC | PRN

## 2020-11-10 MED ORDER — MECLIZINE HCL 25 MG PO TABS
50.0000 mg | ORAL_TABLET | Freq: Once | ORAL | Status: AC
  Administered 2020-11-10: 50 mg via ORAL
  Filled 2020-11-10: qty 2

## 2020-11-10 NOTE — Progress Notes (Signed)
   Subjective:    Patient ID: Rhonda Hudson, female    DOB: [birth year], 65 y.o.   MRN: [id number]  HPI Chief Complaint  Patient presents with  . Dizziness    Started last night, gets this several times ayear. Can't hardly walk, feels bad and nauseated   She is a 65 year old female with history of recurrent DVT on Xarelto, DM, hyperthyroidism, and anxiety who presents with complaints of acute onset of dizziness that started last night.  She is having difficulty standing and walking.   Denies fever, chills, headache, vision changes, chest pain, palpitations, shortness of breath, abdominal pain, N/V/D.   Denies alcohol or drug use.  She does smoke.   Refuses to get on the exam table or to put on a gown.   Reviewed allergies, medications, past medical, surgical, family, and social history.    Review of Systems Pertinent positives and negatives in the history of present illness.     Objective:   Physical Exam Eyes:     General: Visual field deficit present.  Cardiovascular:     Rate and Rhythm: Normal rate and regular rhythm.  Pulmonary:     Effort: Pulmonary effort is normal.     Breath sounds: Normal breath sounds.  Neurological:     Mental Status: She is alert and oriented to person, place, and time.     Cranial Nerves: No facial asymmetry.     Motor: Weakness and tremor present.     Coordination: Coordination abnormal. Finger-Nose-Finger Test abnormal and Heel to Shin Test abnormal.     Gait: Gait abnormal.     Comments: Left arm and left leg mildly weaker   Psychiatric:        Attention and Perception: Attention normal.        Mood and Affect: Mood is anxious.    BP (!) 150/92 (BP Location: Right Arm, Patient Position: Standing, Cuff Size: Large)   Pulse 86   Temp 97.7 F (36.5 C)   SpO2 98%       Assessment & Plan:  Stroke-like symptoms  Dizziness  Left-sided weakness  Ataxia  Difficulty walking  Random glucose 143 Refuses to have an EKG. EMS  called and she is amenable to be evaluated in the ED.

## 2020-11-10 NOTE — ED Notes (Signed)
Patient ambulated with assistance, reports some dizziness.

## 2020-11-10 NOTE — ED Provider Notes (Signed)
[facility] EMERGENCY DEPARTMENT Provider Note   CSN: [id number] Arrival date & time: 11/10/20  1625     History No chief complaint on file.   Rhonda Hudson is a 65 y.o. female.  HPI   64 year old female with past medical history of hypothyroidism, DM, lupus with previous DVT anticoagulated on Xarelto presents to the emergency department with concern for dizziness.  Patient states all day yesterday she felt "off".  She has not been able to give me a more specific complaint.  She states she went to bed at 11 PM last night.  When she woke up overnight around 3 or 4 AM to go to the restroom she felt dizzy and off-balance when she was walking.  She was able to go back to sleep and when she woke up this morning the dizziness had persisted.  She states it does not feel like a room spinning sensation, she sometimes feel nauseous but has not had any vomiting.  Position does not change the dizziness.  She has a dizziness at rest.  She denies any headache, vision changes, facial droop, speech difficulty, focal weakness/sensory change.  Currently patient feels lightheaded just sitting in the evaluation room.  Past Medical History:  Diagnosis Date  . Cancer ([place])    skin Ca on phase and back  . Cataracts, bilateral   . Chronic anticoagulation 07/15/2017  . Diabetes mellitus without complication ([place])   . Diverticulitis   . DVT (deep venous thrombosis) ([place])   . DVT of lower extremity (deep venous thrombosis) ([place]) 01/04/2016  . 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 Hiatal hernia with gastroesophageal reflux    disease  . History of prediabetes   . Hyperthyroidism   . Insomnia   . Irritable bowel syndrome   . 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 Panic attacks    Hx of  . Thickened endometrium   . Thyroid nodule 03/03/2019    Patient Active Problem List   Diagnosis Date Noted  . Endometrial hyperplasia without atypia, complex  11/04/2019  . S/P laparoscopic hysterectomy 11/04/2019  . 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 Anxiety and depression 10/27/2019  . Elevated LDL cholesterol level 07/07/2019  . Atypical nevi 06/29/2019  . Diabetes mellitus without complication ([place]) 01/77/9390  . Thyroiditis, acute 03/17/2019  . 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 Right thyroid nodule 03/03/2019  . Elevated hemoglobin A1c 01/18/2019  . Chronic anticoagulation 07/15/2017  . History of prediabetes   . 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 Smoker 01/14/2017  . Personal history of noncompliance with medical treatment, presenting hazards to health 01/14/2017  . Acute loss of vision, left 10/14/2016  . History of hypothyroidism 01/04/2016  . DVT of lower extremity (deep venous thrombosis) ([place]) 01/04/2016    Past Surgical History:  Procedure Laterality Date  . CHOLECYSTECTOMY    . CYSTOSCOPY  11/04/2019   Procedure: CYSTOSCOPY;  Surgeon: Azucena Fallen, MD;  Location: [facility];  Service: Gynecology;;  . DILATION AND CURETTAGE OF UTERUS N/A 06/16/2018   Procedure: DILATATION AND CURETTAGE;  Surgeon: Emily Filbert, MD;  Location: [facility];  Service: Gynecology;  Laterality: N/A;  . ROBOTIC ASSISTED TOTAL HYSTERECTOMY WITH BILATERAL SALPINGO OOPHERECTOMY Bilateral 11/04/2019   Procedure: XI ROBOTIC ASSISTED TOTAL HYSTERECTOMY WITH BILATERAL SALPINGO OOPHORECTOMY/Pelvic Washings;  Surgeon: Azucena Fallen, MD;  Location: [facility];  Service: Gynecology;  Laterality: Bilateral;  Leana Roe  to RNFA confirmed on 10/15/19 CS  . TOE SURGERY    . TUBAL LIGATION    . US ECHOCARDIOGRAPHY  01-31-2009   EF 60%     OB History    Gravida  3   Para      Term      Preterm      AB      Living  3     SAB      IAB      Ectopic      Multiple      Live Births  3           Family History  Problem Relation Age of Onset  . Diabetes Mother   . Leukemia Father   .  Leukemia Sister   . Diabetes Sister   . Leukemia Brother   . Multiple sclerosis Neg Hx   . Autoimmune disease Neg Hx   . Thyroid disease Neg Hx     Social History   Tobacco Use  . Smoking status: Current Every Day Smoker    Packs/day: 0.50    Types: Cigarettes  . Smokeless tobacco: Never Used  Vaping Use  . Vaping Use: Never used  Substance Use Topics  . Alcohol use: No    Alcohol/week: 0.0 standard drinks  . Drug use: No    Home Medications Prior to Admission medications   Medication Sig Start Date End Date Taking? Authorizing Provider  acetaminophen (TYLENOL) 500 MG tablet Take 2 tablets (1,000 mg total) by mouth every 6 (six) hours. 11/05/19   Azucena Fallen, MD  atorvastatin (LIPITOR) 10 MG tablet Take 1 tablet daily 10/04/20   Henson, [name] L, NP-C  metFORMIN (GLUCOPHAGE) 500 MG tablet TAKE 1 TABLET TWICE DAILY WITH MEALS 09/14/20   Renato Shin, MD  XARELTO 10 MG TABS tablet TAKE 1 TABLET EVERY DAY 10/26/20   Henson, [name] L, NP-C    Allergies    Patient has no known allergies.  Review of Systems   Review of Systems  Constitutional: Positive for fatigue. Negative for chills and fever.  HENT: Negative for congestion.   Eyes: Negative for visual disturbance.  Respiratory: Negative for shortness of breath.   Cardiovascular: Negative for chest pain.  Gastrointestinal: Negative for abdominal pain, diarrhea and vomiting.  Genitourinary: Negative for dysuria.  Skin: Negative for rash.  Neurological: Positive for dizziness. Negative for facial asymmetry, speech difficulty, weakness and headaches.    Physical Exam Updated Vital Signs BP 139/66   Pulse 79   Temp 98.2 F (36.8 C) (Oral)   Resp (!) 24   Ht 5\' 8"  (1.727 m)   Wt 91.2 kg   SpO2 97%   BMI 30.56 kg/m   Physical Exam Vitals and nursing note reviewed.  Constitutional:      Appearance: Normal appearance.  HENT:     Head: Normocephalic.     Mouth/Throat:     Mouth: Mucous membranes are moist.   Cardiovascular:     Rate and Rhythm: Normal rate.  Pulmonary:     Effort: Pulmonary effort is normal. No respiratory distress.  Abdominal:     Palpations: Abdomen is soft.     Tenderness: There is no abdominal tenderness.  Skin:    General: Skin is warm.  Neurological:     Mental Status: She is alert and oriented to person, place, and time.     Comments: See NIH  Psychiatric:        Mood and Affect: Mood normal.  ED Results / Procedures / Treatments   Labs (all labs ordered are listed, but only abnormal results are displayed) Labs Reviewed  BASIC METABOLIC PANEL - Abnormal; Notable for the following components:      Result Value   Glucose, Bld 112 (*)    All other components within normal limits  CBC WITH DIFFERENTIAL/PLATELET - Abnormal; Notable for the following components:   WBC 13.2 (*)    RBC 5.15 (*)    Neutro Abs 9.0 (*)    All other components within normal limits  URINALYSIS, ROUTINE W REFLEX MICROSCOPIC  CBC WITH DIFFERENTIAL/PLATELET    EKG EKG Interpretation  Date/Time:  Friday November 10 2020 16:35:09 EST Ventricular Rate:  84 PR Interval:    QRS Duration: 106 QT Interval:  381 QTC Calculation: 451 R Axis:   63 Text Interpretation: Sinus rhythm Low voltage, precordial leads RSR' in V1 or V2, right VCD or RVH NSR,  RSR pattern old, no STEMI Confirmed by Lavenia Atlas [id number]) on 11/10/2020 5:12:34 PM   Radiology No results found.  Procedures Procedures   Medications Ordered in ED Medications  meclizine (ANTIVERT) tablet 50 mg (50 mg Oral Given 11/10/20 1842)    ED Course  I have reviewed the triage vital signs and the nursing notes.  Pertinent labs & imaging results that were available during my care of the patient were reviewed by me and considered in my medical decision making (see chart for details).    MDM Rules/Calculators/A&P                          65 year old female presents the emergency department with persistent dizziness  and left-sided weakness.  She has drift in the left upper and lower extremity on exam, NIH of 3 with mild ataxia in the left upper extremity.  Due to the duration of her symptoms we will plan for MRI to rule out CVA.  We will treat for possible vertigo in the meantime.  MRI shows no findings of CVA.  After meclizine her symptoms have resolved.  She still has intermittent weakness in the left upper and lower extremity but patient feels like this is baseline.  We will plan to discharge her and treat her for vertigo with meclizine with outpatient neurology follow-up.  She has no chest pain, palpitations or shortness of breath, does not appear to be a cardiac or pulmonary aspect of dizziness.  Patient will be discharged and treated as an outpatient.  Discharge plan and strict return to ED precautions discussed, patient verbalizes understanding and agreement.  Final Clinical Impression(s) / ED Diagnoses Final diagnoses:  None    Rx / DC Orders ED Discharge Orders    None       Lorelle Gibbs, DO 11/10/20 2326

## 2020-11-10 NOTE — Telephone Encounter (Signed)
Pt states she gets vertigo a couple times a year and last night she started having dizziness whether she is walking,sitting, standing, laying down. She is really dizzy and nauseated. She would like something called in

## 2020-11-10 NOTE — Telephone Encounter (Signed)
Pt coming in today to be seen

## 2020-11-10 NOTE — ED Triage Notes (Signed)
Pt arrived via [facility] with c/o dizziness that started last night and has been constant since. EMS reports left sided weakness on assessment with LKW time being 2/24 2200.

## 2020-11-10 NOTE — Addendum Note (Signed)
Addended by: Minette Headland A on: 11/10/2020 04:43 PM   Modules accepted: Orders

## 2020-11-10 NOTE — Discharge Instructions (Addendum)
You have been seen and discharged from the emergency department.  Your MRI did not show stroke.  Follow-up with your primary doctor neurology for further care of what we believe is vertigo.  Follow-up with your primary provider for reevaluation. Take new prescriptions and home medications as prescribed. If you have any worsening symptoms or further concerns for health please return to an emergency department for further evaluation.

## 2020-11-10 NOTE — Telephone Encounter (Signed)
She should be seen to determine possible reason for her dizziness. Is she having any vision changes, headache, weakness, speech difficulties or confusion? Is she saying this feels like her usual dizziness related to vertigo?

## 2020-11-21 ENCOUNTER — Encounter (INDEPENDENT_AMBULATORY_CARE_PROVIDER_SITE_OTHER): Payer: Self-pay | Admitting: Otolaryngology

## 2020-11-21 ENCOUNTER — Ambulatory Visit (INDEPENDENT_AMBULATORY_CARE_PROVIDER_SITE_OTHER): Payer: Medicare HMO | Admitting: Otolaryngology

## 2020-11-21 ENCOUNTER — Other Ambulatory Visit: Payer: Self-pay

## 2020-11-21 VITALS — Temp 97.2°F

## 2020-11-21 DIAGNOSIS — K118 Other diseases of salivary glands: Secondary | ICD-10-CM | POA: Diagnosis not present

## 2020-11-21 NOTE — Progress Notes (Signed)
HPI: Rhonda Hudson is a 65 y.o. female who presents is referred by her PCP for evaluation of parotid glands.  She stated that she was found to have a right parotid mass by Dr. Erik Obey 2 years ago and had fine-needle aspirate of the parotid gland and was told that this was a benign slow-growing lesion that should not cause her any problems and nothing was done at that time.  She also has history of multinodular goiter with cysts that have been aspirated and is followed by endocrinology.. Apparently at that time patient was having some trouble with swallowing and had a CT scan of her neck that demonstrated a 2.3 cm deep right parotid mass.  Fine-needle aspirate demonstrated a few atypical cells but was nondiagnostic otherwise. Today patient complains of pain and discomfort more in the left parotid area than the right. 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   Past Medical History:  Diagnosis Date  . Cancer ([place])    skin Ca on phase and back  . Cataracts, bilateral   . Chronic anticoagulation 07/15/2017  . Diabetes mellitus without complication ([place])   . Diverticulitis   . DVT (deep venous thrombosis) ([place])   . DVT of lower extremity (deep venous thrombosis) ([place]) 01/04/2016  . 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 Hiatal hernia with gastroesophageal reflux    disease  . History of prediabetes   . Hyperthyroidism   . Insomnia   . Irritable bowel syndrome   . 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 Panic attacks    Hx of  . Thickened endometrium   . Thyroid nodule 03/03/2019   Past Surgical History:  Procedure Laterality Date  . CHOLECYSTECTOMY    . CYSTOSCOPY  11/04/2019   Procedure: CYSTOSCOPY;  Surgeon: Azucena Fallen, MD;  Location: [facility];  Service: Gynecology;;  . DILATION AND CURETTAGE OF UTERUS N/A 06/16/2018   Procedure: DILATATION AND  CURETTAGE;  Surgeon: Emily Filbert, MD;  Location: [facility];  Service: Gynecology;  Laterality: N/A;  . ROBOTIC ASSISTED TOTAL HYSTERECTOMY WITH BILATERAL SALPINGO OOPHERECTOMY Bilateral 11/04/2019   Procedure: XI ROBOTIC ASSISTED TOTAL HYSTERECTOMY WITH BILATERAL SALPINGO OOPHORECTOMY/Pelvic Washings;  Surgeon: Azucena Fallen, MD;  Location: [facility];  Service: Gynecology;  Laterality: Bilateral;  Tracie to RNFA confirmed on 10/15/19 CS  . TOE SURGERY    . TUBAL LIGATION    . US ECHOCARDIOGRAPHY  01-31-2009   EF 60%   Social History   Socioeconomic History  . Marital status: Divorced    Spouse name: Not on file  . Number of children: 2  . Years of education: 66  . Highest education level: 12th grade  Occupational History  . Occupation: Social Security Disability  Tobacco Use  . Smoking status: Current Every Day Smoker    Packs/day: 0.50    Years: 42.00    Pack years: 21.00    Types: Cigarettes    Start date: 56  . Smokeless tobacco: Never Used  Vaping Use  . Vaping Use: Never used  Substance and Sexual Activity  . Alcohol use: No    Alcohol/week: 0.0 standard drinks  . Drug use: No  . Sexual activity: Not Currently    Birth control/protection: Post-menopausal  Other Topics Concern  . Not on file  Social History Narrative   Lives with two sons   Caffeine use: coffee and  coke daily   Right-handed   Social Determinants of Health   Financial Resource Strain: High Risk  . Difficulty of Paying Living Expenses: Hard  Food Insecurity: Food Insecurity Present  . Worried About Charity fundraiser in the Last Year: Never true  . Ran Out of Food in the Last Year: Often true  Transportation Needs: No Transportation Needs  . Lack of Transportation (Medical): No  . Lack of Transportation (Non-Medical): No  Physical Activity: Inactive  . Days of Exercise per Week: 0 days  . Minutes of Exercise per Session: 0 min  Stress: Stress Concern Present   . Feeling of Stress : Very much  Social Connections: Moderately Isolated  . Frequency of Communication with Friends and Family: More than three times a week  . Frequency of Social Gatherings with Friends and Family: More than three times a week  . Attends Religious Services: More than 4 times per year  . Active Member of Clubs or Organizations: No  . Attends Archivist Meetings: Never  . Marital Status: Divorced   Family History  Problem Relation Age of Onset  . Diabetes Mother   . Leukemia Father   . Leukemia Sister   . Diabetes Sister   . Leukemia Brother   . Multiple sclerosis Neg Hx   . Autoimmune disease Neg Hx   . Thyroid disease Neg Hx    No Known Allergies Prior to Admission medications   Medication Sig Start Date End Date Taking? Authorizing Provider  acetaminophen (TYLENOL) 500 MG tablet Take 2 tablets (1,000 mg total) by mouth every 6 (six) hours. 11/05/19   Azucena Fallen, MD  atorvastatin (LIPITOR) 10 MG tablet Take 1 tablet daily 10/04/20   Henson, [name] L, NP-C  meclizine (ANTIVERT) 25 MG tablet Take 1 tablet (25 mg total) by mouth 3 (three) times daily as needed for dizziness. 11/10/20   Horton, Alvin Critchley, DO  metFORMIN (GLUCOPHAGE) 500 MG tablet TAKE 1 TABLET TWICE DAILY WITH MEALS 09/14/20   Renato Shin, MD  XARELTO 10 MG TABS tablet TAKE 1 TABLET EVERY DAY 10/26/20   Henson, [name] L, NP-C     Positive ROS: Otherwise negative  All other systems have been reviewed and were otherwise negative with the exception of those mentioned in the HPI and as above.  Physical Exam: Constitutional: Alert, well-appearing, no acute distress Ears: External ears without lesions or tenderness. Ear canals are clear bilaterally with intact, clear TMs.  Nasal: External nose without lesions. Septum with mild deformity.. Clear nasal passages otherwise. Oral: Lips and gums without lesions. Tongue and palate mucosa without lesions. Posterior oropharynx clear.  Drainage from  both parotid ducts is clear. Neck: The right parotid mass is palpable but is not very large approximately 2-2.5 centimeters.  She has normal facial nerve function.  She has no palpable adenopathy lower in the neck.  Palpation of the left parotid gland is soft with no palpable masses.  She does have thyroid goiter. Respiratory: Breathing comfortably  Skin: No facial/neck lesions or rash noted.  Procedures  Assessment: Right parotid mass noted on previous CT scan 2 years ago measuring 2.2 cm in size.  At that time patient was followed by Dr. Erik Obey who recommended observation of this following results of the fine-needle aspirate.  Plan: On clinical exam today the right parotid mass has not significantly enlarged.  I discussed with her concerning obtaining another fine-needle aspirate of the parotid mass for better diagnosis.  She really does not want  to have another needle aspirate performed.  She is followed by endocrinology concerning her multinodular goiter. She states that the left parotid gland is bothering her although on exam today there is no signs of infection or masses in the left parotid gland. I also discussed possibly removal of the right parotid tumor but she wants to hold off on this for now.   Radene Journey, MD   CC:

## 2021-01-03 ENCOUNTER — Telehealth (INDEPENDENT_AMBULATORY_CARE_PROVIDER_SITE_OTHER): Payer: Self-pay | Admitting: Otolaryngology

## 2021-01-03 ENCOUNTER — Other Ambulatory Visit (INDEPENDENT_AMBULATORY_CARE_PROVIDER_SITE_OTHER): Payer: Self-pay | Admitting: Otolaryngology

## 2021-01-03 MED ORDER — CEPHALEXIN 500 MG PO CAPS: 500.0000 mg | ORAL_CAPSULE | Freq: Three times a day (TID) | ORAL | 0 refills | Status: DC

## 2021-01-03 NOTE — Telephone Encounter (Signed)
Rhonda Hudson called concerning swelling of her right parotid gland where she had a needle biopsy done over a year ago.  It is causing some pain and discomfort. I discussed with her concerning treatment with antibiotics and she will notify us if she has any persistent swelling or pain after completion of the antibiotic. Sent prescription of Keflex 500 mg 3 times daily for 1 week into her pharmacy online.

## 2021-01-09 DIAGNOSIS — L812 Freckles: Secondary | ICD-10-CM | POA: Diagnosis not present

## 2021-01-09 DIAGNOSIS — I8393 Asymptomatic varicose veins of bilateral lower extremities: Secondary | ICD-10-CM | POA: Diagnosis not present

## 2021-01-09 DIAGNOSIS — L905 Scar conditions and fibrosis of skin: Secondary | ICD-10-CM | POA: Diagnosis not present

## 2021-01-09 DIAGNOSIS — D229 Melanocytic nevi, unspecified: Secondary | ICD-10-CM | POA: Diagnosis not present

## 2021-01-09 DIAGNOSIS — L718 Other rosacea: Secondary | ICD-10-CM | POA: Diagnosis not present

## 2021-01-09 DIAGNOSIS — L821 Other seborrheic keratosis: Secondary | ICD-10-CM | POA: Diagnosis not present

## 2021-01-09 DIAGNOSIS — D1801 Hemangioma of skin and subcutaneous tissue: Secondary | ICD-10-CM | POA: Diagnosis not present

## 2021-01-09 DIAGNOSIS — L819 Disorder of pigmentation, unspecified: Secondary | ICD-10-CM | POA: Diagnosis not present

## 2021-01-09 DIAGNOSIS — L814 Other melanin hyperpigmentation: Secondary | ICD-10-CM | POA: Diagnosis not present

## 2021-01-10 ENCOUNTER — Encounter: Payer: Self-pay | Admitting: Endocrinology

## 2021-01-10 ENCOUNTER — Other Ambulatory Visit: Payer: Self-pay

## 2021-01-10 ENCOUNTER — Ambulatory Visit: Payer: Medicare HMO | Admitting: Endocrinology

## 2021-01-10 VITALS — BP 128/82 | HR 83 | Ht 68.0 in | Wt 211.4 lb

## 2021-01-10 DIAGNOSIS — E119 Type 2 diabetes mellitus without complications: Secondary | ICD-10-CM

## 2021-01-10 LAB — POCT GLYCOSYLATED HEMOGLOBIN (HGB A1C): Hemoglobin A1C: 6.4 % — AB (ref 4.0–5.6)

## 2021-01-10 MED ORDER — METFORMIN HCL ER 500 MG PO TB24: 1500.0000 mg | ORAL_TABLET | Freq: Every day | ORAL | 3 refills | Status: DC

## 2021-01-10 NOTE — Patient Instructions (Addendum)
I have sent a prescription to your pharmacy, to increase the metformin to 3 pills per day.   Please come back for a follow-up appointment in 6 months, when we'll plan to recheck the ultrasound.

## 2021-01-10 NOTE — Progress Notes (Signed)
Subjective:    Patient ID: Rhonda Hudson, female    DOB: 01/21/1956, 65 y.o.   MRN: [id number]  HPI Pt returns for f/u of diabetes mellitus:  DM type: 2 Dx'ed: 3664 Complications: none Therapy: metformin GDM: never DKA: never Severe hypoglycemia: never Pancreatitis: never Pancreatic imaging: normal on 2010 CT Other: she has never taken insulin Interval history: she takes metformin as rx'ed, and tolerates well.   Pt also resturns for f/u of MNG (dx'ed 1981; a cyst was drained then; she then took an uncertain medication x 1 year, but not since; [place] (2020) showed 5.5 cm right lobe cyst; Left upper thyroid nodule (labeled 2) meets criteria for biopsy.  Left lower thyroid nodule (labeled 3) meets criteria for surveillance; Cytology (2020) was cat 1, and (2021) was cat 2). Pt says the goiter seems unchanged.   Past Medical History:  Diagnosis Date  . Cancer ([place])    skin Ca on phase and back  . Cataracts, bilateral   . Chronic anticoagulation 07/15/2017  . Diabetes mellitus without complication ([place])   . Diverticulitis   . DVT (deep venous thrombosis) ([place])   . DVT of lower extremity (deep venous thrombosis) ([place]) 01/04/2016  . 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 Hiatal hernia with gastroesophageal reflux    disease  . History of prediabetes   . Hyperthyroidism   . Insomnia   . Irritable bowel syndrome   . 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 Panic attacks    Hx of  . Thickened endometrium   . Thyroid nodule 03/03/2019    Past Surgical History:  Procedure Laterality Date  . CHOLECYSTECTOMY    . CYSTOSCOPY  11/04/2019   Procedure: CYSTOSCOPY;  Surgeon: Azucena Fallen, MD;  Location: [facility];  Service: Gynecology;;  . DILATION AND CURETTAGE OF UTERUS N/A 06/16/2018   Procedure: DILATATION AND CURETTAGE;  Surgeon: Emily Filbert, MD;  Location: [facility];  Service: Gynecology;  Laterality: N/A;  .  ROBOTIC ASSISTED TOTAL HYSTERECTOMY WITH BILATERAL SALPINGO OOPHERECTOMY Bilateral 11/04/2019   Procedure: XI ROBOTIC ASSISTED TOTAL HYSTERECTOMY WITH BILATERAL SALPINGO OOPHORECTOMY/Pelvic Washings;  Surgeon: Azucena Fallen, MD;  Location: [facility];  Service: Gynecology;  Laterality: Bilateral;  Tracie to RNFA confirmed on 10/15/19 CS  . TOE SURGERY    . TUBAL LIGATION    . US ECHOCARDIOGRAPHY  01-31-2009   EF 60%    Social History   Socioeconomic History  . Marital status: Divorced    Spouse name: Not on file  . Number of children: 2  . Years of education: 46  . Highest education level: 12th grade  Occupational History  . Occupation: Social Security Disability  Tobacco Use  . Smoking status: Current Every Day Smoker    Packs/day: 0.50    Years: 42.00    Pack years: 21.00    Types: Cigarettes    Start date: 24  . Smokeless tobacco: Never Used  Vaping Use  . Vaping Use: Never used  Substance and Sexual Activity  . Alcohol use: No    Alcohol/week: 0.0 standard drinks  . Drug use: No  . Sexual activity: Not Currently    Birth control/protection: Post-menopausal  Other Topics Concern  . Not on file  Social History Narrative   Lives with two sons   Caffeine use: coffee and coke daily   Right-handed   Social Determinants of Health   Financial Resource Strain:  High Risk  . Difficulty of Paying Living Expenses: Hard  Food Insecurity: Food Insecurity Present  . Worried About Charity fundraiser in the Last Year: Never true  . Ran Out of Food in the Last Year: Often true  Transportation Needs: No Transportation Needs  . Lack of Transportation (Medical): No  . Lack of Transportation (Non-Medical): No  Physical Activity: Inactive  . Days of Exercise per Week: 0 days  . Minutes of Exercise per Session: 0 min  Stress: Stress Concern Present  . Feeling of Stress : Very much  Social Connections: Moderately Isolated  . Frequency of Communication with Friends  and Family: More than three times a week  . Frequency of Social Gatherings with Friends and Family: More than three times a week  . Attends Religious Services: More than 4 times per year  . Active Member of Clubs or Organizations: No  . Attends Archivist Meetings: Never  . Marital Status: Divorced  Human resources officer Violence: Not At Risk  . Fear of Current or Ex-Partner: No  . Emotionally Abused: No  . Physically Abused: No  . Sexually Abused: No    Current Outpatient Medications on File Prior to Visit  Medication Sig Dispense Refill  . acetaminophen (TYLENOL) 500 MG tablet Take 2 tablets (1,000 mg total) by mouth every 6 (six) hours. 30 tablet 0  . atorvastatin (LIPITOR) 10 MG tablet Take 1 tablet daily 90 tablet 1  . meclizine (ANTIVERT) 25 MG tablet Take 1 tablet (25 mg total) by mouth 3 (three) times daily as needed for dizziness. 30 tablet 0   No current facility-administered medications on file prior to visit.    No Known Allergies  Family History  Problem Relation Age of Onset  . Diabetes Mother   . Leukemia Father   . Leukemia Sister   . Diabetes Sister   . Leukemia Brother   . Multiple sclerosis Neg Hx   . Autoimmune disease Neg Hx   . Thyroid disease Neg Hx     BP 128/82 (BP Location: Right Arm, Patient Position: Sitting, Cuff Size: Normal)   Pulse 83   Ht 5\' 8"  (1.727 m)   Wt 211 lb 6.4 oz (95.9 kg)   SpO2 94%   BMI 32.14 kg/m    Review of Systems     Objective:   Physical Exam VITAL SIGNS:  See vs page GENERAL: no distress Pulses: dorsalis pedis intact bilat.   MSK: no deformity of the feet CV: no leg edema, but there are bilat vv's Skin:  no ulcer on the feet.  normal color and temp on the feet. Neuro: sensation is intact to touch on the feet  Lab Results  Component Value Date   HGBA1C 6.4 (A) 01/10/2021    Lab Results  Component Value Date   TSH 1.15 07/11/2020       Assessment & Plan:  MNG: we discussed f/u of this.  Type  2 DM: She would benefit from increased rx, if it can be done with a regimen that avoids or minimizes hypoglycemia.    Patient Instructions  I have sent a prescription to your pharmacy, to increase the metformin to 3 pills per day.   Please come back for a follow-up appointment in 6 months, when we'll plan to recheck the ultrasound.

## 2021-01-11 ENCOUNTER — Other Ambulatory Visit: Payer: Self-pay | Admitting: Family Medicine

## 2021-02-13 ENCOUNTER — Encounter (INDEPENDENT_AMBULATORY_CARE_PROVIDER_SITE_OTHER): Payer: Self-pay | Admitting: Otolaryngology

## 2021-02-13 ENCOUNTER — Ambulatory Visit (INDEPENDENT_AMBULATORY_CARE_PROVIDER_SITE_OTHER): Payer: Medicare HMO | Admitting: Otolaryngology

## 2021-02-13 ENCOUNTER — Other Ambulatory Visit: Payer: Self-pay

## 2021-02-13 VITALS — Temp 97.7°F

## 2021-02-13 DIAGNOSIS — H6123 Impacted cerumen, bilateral: Secondary | ICD-10-CM | POA: Diagnosis not present

## 2021-02-13 DIAGNOSIS — K118 Other diseases of salivary glands: Secondary | ICD-10-CM | POA: Diagnosis not present

## 2021-02-13 NOTE — Progress Notes (Signed)
HPI: Rhonda Hudson is a 65 y.o. female who returns today for evaluation of recent swelling and pain of the right parotid gland.  I called and Keflex 500 milligrams 3 times daily for a week and this is done much better since taking the antibiotic.  She describes swelling behind and underneath her right ear.  But this is doing much better today. She had a previous history of a deep 2 cm right parotid mass and had fine-needle aspirate performed 2 years ago.  At that time this was evaluated with Dr. Erik Obey recommended observation.  However on review of the fine-needle aspirate this showed a few atypical cells and was not necessarily diagnostic.  I discussed with her concerning repeat FNA but she did not want to have this performed..  Past Medical History:  Diagnosis Date  . Cancer ([place])    skin Ca on phase and back  . Cataracts, bilateral   . Chronic anticoagulation 07/15/2017  . Diabetes mellitus without complication ([place])   . Diverticulitis   . DVT (deep venous thrombosis) ([place])   . DVT of lower extremity (deep venous thrombosis) ([place]) 01/04/2016  . 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 Hiatal hernia with gastroesophageal reflux    disease  . History of prediabetes   . Hyperthyroidism   . Insomnia   . Irritable bowel syndrome   . 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 Panic attacks    Hx of  . Thickened endometrium   . Thyroid nodule 03/03/2019   Past Surgical History:  Procedure Laterality Date  . CHOLECYSTECTOMY    . CYSTOSCOPY  11/04/2019   Procedure: CYSTOSCOPY;  Surgeon: Azucena Fallen, MD;  Location: [facility];  Service: Gynecology;;  . DILATION AND CURETTAGE OF UTERUS N/A 06/16/2018   Procedure: DILATATION AND CURETTAGE;  Surgeon: Emily Filbert, MD;  Location: [facility];  Service: Gynecology;  Laterality: N/A;  . ROBOTIC ASSISTED TOTAL HYSTERECTOMY WITH BILATERAL SALPINGO OOPHERECTOMY Bilateral 11/04/2019    Procedure: XI ROBOTIC ASSISTED TOTAL HYSTERECTOMY WITH BILATERAL SALPINGO OOPHORECTOMY/Pelvic Washings;  Surgeon: Azucena Fallen, MD;  Location: [facility];  Service: Gynecology;  Laterality: Bilateral;  Tracie to RNFA confirmed on 10/15/19 CS  . TOE SURGERY    . TUBAL LIGATION    . US ECHOCARDIOGRAPHY  01-31-2009   EF 60%   Social History   Socioeconomic History  . Marital status: Divorced    Spouse name: Not on file  . Number of children: 2  . Years of education: 69  . Highest education level: 12th grade  Occupational History  . Occupation: Social Security Disability  Tobacco Use  . Smoking status: Current Every Day Smoker    Packs/day: 0.50    Years: 42.00    Pack years: 21.00    Types: Cigarettes    Start date: 31  . Smokeless tobacco: Never Used  Vaping Use  . Vaping Use: Never used  Substance and Sexual Activity  . Alcohol use: No    Alcohol/week: 0.0 standard drinks  . Drug use: No  . Sexual activity: Not Currently    Birth control/protection: Post-menopausal  Other Topics Concern  . Not on file  Social History Narrative   Lives with two sons   Caffeine use: coffee and coke daily   Right-handed   Social Determinants of Health   Financial Resource Strain: High Risk  . Difficulty of Paying Living Expenses: Hard  Food Insecurity: Food  Insecurity Present  . Worried About Charity fundraiser in the Last Year: Never true  . Ran Out of Food in the Last Year: Often true  Transportation Needs: No Transportation Needs  . Lack of Transportation (Medical): No  . Lack of Transportation (Non-Medical): No  Physical Activity: Inactive  . Days of Exercise per Week: 0 days  . Minutes of Exercise per Session: 0 min  Stress: Stress Concern Present  . Feeling of Stress : Very much  Social Connections: Moderately Isolated  . Frequency of Communication with Friends and Family: More than three times a week  . Frequency of Social Gatherings with Friends and  Family: More than three times a week  . Attends Religious Services: More than 4 times per year  . Active Member of Clubs or Organizations: No  . Attends Archivist Meetings: Never  . Marital Status: Divorced   Family History  Problem Relation Age of Onset  . Diabetes Mother   . Leukemia Father   . Leukemia Sister   . Diabetes Sister   . Leukemia Brother   . Multiple sclerosis Neg Hx   . Autoimmune disease Neg Hx   . Thyroid disease Neg Hx    No Known Allergies Prior to Admission medications   Medication Sig Start Date End Date Taking? Authorizing Provider  acetaminophen (TYLENOL) 500 MG tablet Take 2 tablets (1,000 mg total) by mouth every 6 (six) hours. 11/05/19   Azucena Fallen, MD  atorvastatin (LIPITOR) 10 MG tablet Take 1 tablet daily 10/04/20   Henson, [name] L, NP-C  meclizine (ANTIVERT) 25 MG tablet Take 1 tablet (25 mg total) by mouth 3 (three) times daily as needed for dizziness. 11/10/20   Horton, Alvin Critchley, DO  metFORMIN (GLUCOPHAGE-XR) 500 MG 24 hr tablet Take 3 tablets (1,500 mg total) by mouth daily. 01/10/21   Renato Shin, MD  XARELTO 10 MG TABS tablet TAKE 1 TABLET EVERY DAY 01/11/21   Henson, [name] L, NP-C     Positive ROS: Otherwise negative  All other systems have been reviewed and were otherwise negative with the exception of those mentioned in the HPI and as above.  Physical Exam: Constitutional: Alert, well-appearing, no acute distress Ears: External ears without lesions or tenderness. Ear canals are obstructed bilaterally with cerumen that was cleaned with suction.  TMs are otherwise clear. Nasal: External nose without lesions.. Clear nasal passages Oral: Lips and gums without lesions. Tongue and palate mucosa without lesions. Posterior oropharynx clear.  Drainage from the right parotid duct is clear.  There is no palpable stones on palpation. Neck: No palpable adenopathy or masses.  No superficial parotid masses on the right no swelling of the  parotid gland although she does have some fullness deep within the parotid on the right side.  No palpable adenopathy in the neck.  Normal facial nerve function. Respiratory: Breathing comfortably  Skin: No facial/neck lesions or rash noted.  Cerumen impaction removal  Date/Time: 02/13/2021 5:31 PM Performed by: Rozetta Nunnery, MD Authorized by: Rozetta Nunnery, MD   Consent:    Consent obtained:  Verbal   Consent given by:  Patient   Risks discussed:  Pain and bleeding Procedure details:    Location:  L ear and R ear   Procedure type: suction   Post-procedure details:    Inspection:  TM intact and canal normal   Hearing quality:  Improved   Patient tolerance of procedure:  Tolerated well, no immediate complications Comments:  TMs are clear bilaterally.    Assessment: Recent parotitis that resolved with antibiotic. Deep right parotid mass questionable etiology. Cerumen impactions bilaterally that was cleaned in the office today.  Plan: She will follow-up as needed any further swelling of the parotid gland. I will call her concerning obtaining repeat FNA of the right parotid mass although on clinical exam today it i has not significantly enlarged.   Radene Journey, MD

## 2021-02-21 ENCOUNTER — Other Ambulatory Visit: Payer: Self-pay | Admitting: Family Medicine

## 2021-02-21 DIAGNOSIS — E7800 Pure hypercholesterolemia, unspecified: Secondary | ICD-10-CM

## 2021-02-27 LAB — HM DIABETES EYE EXAM

## 2021-03-09 ENCOUNTER — Encounter: Payer: Self-pay | Admitting: Family Medicine

## 2021-03-12 ENCOUNTER — Encounter: Payer: Self-pay | Admitting: Family Medicine

## 2021-05-02 ENCOUNTER — Encounter: Payer: Medicare HMO | Admitting: Family Medicine

## 2021-05-03 ENCOUNTER — Encounter: Payer: Medicare HMO | Admitting: Family Medicine

## 2021-06-04 ENCOUNTER — Other Ambulatory Visit: Payer: Self-pay | Admitting: Family Medicine

## 2021-06-06 ENCOUNTER — Encounter: Payer: Medicare HMO | Admitting: Family Medicine

## 2021-06-20 NOTE — Progress Notes (Deleted)
   Subjective:  Documentation for virtual audio and video telecommunications through [place] encounter:  The patient was located at home. 2 patient identifiers used.  The provider was located in the office. The patient did consent to this visit and is aware of possible charges through their insurance for this visit.  The other persons participating in this telemedicine service were none. Time spent on call was *** minutes and in review of previous records >*** minutes total.  This virtual service is not related to other E/M service within previous 7 days.   Patient ID: Rhonda Hudson, female    DOB: [birth year], 65 y.o.   MRN: [id number]  HPI No chief complaint on file.    Review of Systems Pertinent positives and negatives in the history of present illness.     Objective:   Physical Exam There were no vitals taken for this visit.        Assessment & Plan:  No diagnosis found.

## 2021-06-21 ENCOUNTER — Telehealth: Payer: Medicare HMO | Admitting: Family Medicine

## 2021-06-27 ENCOUNTER — Encounter: Payer: Self-pay | Admitting: Family Medicine

## 2021-06-27 ENCOUNTER — Ambulatory Visit (INDEPENDENT_AMBULATORY_CARE_PROVIDER_SITE_OTHER): Payer: Medicare HMO | Admitting: Family Medicine

## 2021-06-27 ENCOUNTER — Other Ambulatory Visit: Payer: Self-pay

## 2021-06-27 VITALS — BP 140/98 | HR 98

## 2021-06-27 DIAGNOSIS — F17200 Nicotine dependence, unspecified, uncomplicated: Secondary | ICD-10-CM | POA: Diagnosis not present

## 2021-06-27 DIAGNOSIS — Z7901 Long term (current) use of anticoagulants: Secondary | ICD-10-CM

## 2021-06-27 DIAGNOSIS — E7800 Pure hypercholesterolemia, unspecified: Secondary | ICD-10-CM

## 2021-06-27 DIAGNOSIS — Z23 Encounter for immunization: Secondary | ICD-10-CM

## 2021-06-27 NOTE — Progress Notes (Deleted)
Subjective:    Patient ID: Rhonda Hudson, female    DOB: Jun 21, 1956, 65 y.o.   MRN: [id number]  Rhonda Hudson is a 65 y.o. female who presents for follow-up of Type 2 diabetes mellitus.  Patient is not checking home blood sugars.   Home blood sugar records: patient does not check sugars How often is blood sugars being checked:   Acute Office Visit  Subjective:    Patient ID: Rhonda Hudson, female    DOB: Mar 15, 1956, 65 y.o.   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[place])    Hiatal hernia with gastroesophageal reflux    disease   History of prediabetes    Hyperthyroidism    Insomnia    Irritable bowel syndrome    Lupus anticoagulant positive 10/31/2019   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/03/2019   Panic attacks    Hx of   Thickened endometrium    Thyroid nodule 03/03/2019    Past Surgical History:  Procedure Laterality Date   CHOLECYSTECTOMY     CYSTOSCOPY  11/04/2019   Procedure: CYSTOSCOPY;  Surgeon: Azucena Fallen, MD;  Location: [facility];  Service: Gynecology;;   DILATION AND CURETTAGE OF UTERUS N/A 06/16/2018   Procedure: DILATATION AND CURETTAGE;  Surgeon: Emily Filbert, MD;  Location: [facility];  Service: Gynecology;  Laterality: N/A;   ROBOTIC ASSISTED TOTAL HYSTERECTOMY WITH BILATERAL SALPINGO OOPHERECTOMY Bilateral 11/04/2019   Procedure: XI ROBOTIC ASSISTED TOTAL HYSTERECTOMY WITH BILATERAL SALPINGO OOPHORECTOMY/Pelvic Washings;  Surgeon: Azucena Fallen, MD;  Location[facility];  Service: Gynecology;   Laterality: Bilateral;  Tracie to RNFA confirmed on 10/15/19 CS   TOE SURGERY     TUBAL LIGATION     US ECHOCARDIOGRAPHY  01-31-2009   EF 60%    Family History  Problem Relation Age of Onset   Diabetes Mother    Leukemia Father    Leukemia Sister    Diabetes Sister    Leukemia Brother    Multiple sclerosis Neg Hx    Autoimmune disease Neg Hx    Thyroid disease Neg Hx     Social History   Socioeconomic History   Marital status: Divorced    Spouse name: Not on file   Number of children: 2   Years of education: 12   Highest education level: 12th grade  Occupational History   Occupation: Social Security Disability  Tobacco Use   Smoking status: Every Day    Packs/day: 0.50    Years: 42.00    Pack years: 21.00    Types: Cigarettes    Start date: 1980   Smokeless tobacco: Never  Vaping Use   Vaping Use: Never used  Substance and Sexual Activity   Alcohol use: No    Alcohol/week: 0.0 standard drinks   Drug use: No   Sexual activity: Not Currently    Birth control/protection: Post-menopausal  Other Topics Concern   Not on file  Social History Narrative   Lives with two sons  Caffeine use: coffee and coke daily   Right-handed   Social Determinants of Health   Financial Resource Strain: High Risk   Difficulty of Paying Living Expenses: Hard  Food Insecurity: Food Insecurity Present   Worried About Charity fundraiser in the Last Year: Never true   Ran Out of Food in the Last Year: Often true  Transportation Needs: No Transportation Needs   Lack of Transportation (Medical): No   Lack of Transportation (Non-Medical): No  Physical Activity: Inactive   Days of Exercise per Week: 0 days   Minutes of Exercise per Session: 0 min  Stress: Stress Concern Present   Feeling of Stress : Very much  Social Connections: Moderately Isolated   Frequency of Communication with Friends and Family: More than three times a week   Frequency of Social Gatherings with Friends and  Family: More than three times a week   Attends Religious Services: More than 4 times per year   Active Member of [employer] or Organizations: No   Attends Archivist Meetings: Never   Marital Status: Divorced  Human resources officer Violence: Not At Risk   Fear of Current or Ex-Partner: No   Emotionally Abused: No   Physically Abused: No   Sexually Abused: No    Outpatient Medications Prior to Visit  Medication Sig Dispense Refill   acetaminophen (TYLENOL) 500 MG tablet Take 2 tablets (1,000 mg total) by mouth every 6 (six) hours. 30 tablet 0   atorvastatin (LIPITOR) 10 MG tablet TAKE 1 TABLET EVERY DAY 90 tablet 1   metFORMIN (GLUCOPHAGE-XR) 500 MG 24 hr tablet Take 3 tablets (1,500 mg total) by mouth daily. 270 tablet 3   XARELTO 10 MG TABS tablet TAKE 1 TABLET EVERY DAY 90 tablet 0   meclizine (ANTIVERT) 25 MG tablet Take 1 tablet (25 mg total) by mouth 3 (three) times daily as needed for dizziness. 30 tablet 0   No facility-administered medications prior to visit.    No Known Allergies  Review of Systems     Objective:    Physical Exam  BP (!) 140/98 (BP Location: Right Arm, Patient Position: Sitting)   Pulse 98   SpO2 98%  Wt Readings from Last 3 Encounters:  01/10/21 211 lb 6.4 oz (95.9 kg)  11/10/20 201 lb (91.2 kg)  11/02/20 201 lb (91.2 kg)    Health Maintenance Due  Topic Date Due   Zoster Vaccines- Shingrix (1 of 2) Never done   COLONOSCOPY (Pts 45-84yrs Insurance coverage will need to be confirmed)  Never done   MAMMOGRAM  02/26/2017   DEXA SCAN  Never done   URINE MICROALBUMIN  01/20/2020   INFLUENZA VACCINE  04/16/2021    There are no preventive care reminders to display for this patient.   Lab Results  Component Value Date   TSH 1.15 07/11/2020   Lab Results  Component Value Date   WBC 13.2 (H) 11/10/2020   HGB 14.8 11/10/2020   HCT 45.2 11/10/2020   MCV 87.8 11/10/2020   PLT 219 11/10/2020   Lab Results  Component Value Date   NA 136  11/10/2020   K 3.8 11/10/2020   CO2 23 11/10/2020   GLUCOSE 112 (H) 11/10/2020   BUN 8 11/10/2020   CREATININE 0.79 11/10/2020   BILITOT 0.3 11/02/2020   ALKPHOS 96 11/02/2020   AST 13 11/02/2020   ALT 20 11/02/2020   PROT 7.4 11/02/2020   ALBUMIN 4.3 11/02/2020   CALCIUM 9.8 11/10/2020  ANIONGAP 11 11/10/2020   GFR 65.82 07/11/2020   Lab Results  Component Value Date   CHOL 168 11/02/2020   Lab Results  Component Value Date   HDL 51 11/02/2020   Lab Results  Component Value Date   LDLCALC 89 11/02/2020   Lab Results  Component Value Date   TRIG 162 (H) 11/02/2020   Lab Results  Component Value Date   CHOLHDL 3.3 11/02/2020   Lab Results  Component Value Date   HGBA1C 6.4 (A) 01/10/2021       Assessment & Plan:   Problem List Items Addressed This Visit       Other   Chronic anticoagulation - Primary   Elevated LDL cholesterol level   Smoker     No orders of the defined types were placed in this encounter.    Harland Dingwall, PA-C  Current symptoms include: {dm sx:14075}. Patient denies {dm sx:19199}.  Patient {is/are not:32546} checking their feet daily. Any Foot concerns (callous, ulcer, wound, thickened nails, toenail fungus, skin fungus, hammer toe): *** Last dilated eye exam: ***  Current treatments: {dm interventions:14074}. Medication compliance: {good/fair/poor:33178}  Current diet: {diet habits:16563} Current exercise: {exercise types:16438} Known diabetic complications: {diabetes complications:1215}  The following portions of the patient's history were reviewed and updated as appropriate: allergies, current medications, past medical history, past social history and problem list.  ROS as in subjective above.     Objective:    Physical Exam Alert and in no distress otherwise not examined.  Blood pressure (!) 140/98, pulse 98, SpO2 98 %.  Lab Review Diabetic Labs Latest Ref Rng & Units 01/10/2021 11/10/2020 11/02/2020 07/11/2020  01/06/2020  HbA1c 4.0 - 5.6 % 6.4(A) - - 6.2(A) 6.3(A)  Micro/Creat Ratio 0 - 29 mg/g creat - - - - -  Chol 100 - 199 mg/dL - - 168 - -  HDL >39 mg/dL - - 51 - -  Calc LDL 0 - 99 mg/dL - - 89 - -  Triglycerides 0 - 149 mg/dL - - 162(H) - -  Creatinine 0.44 - 1.00 mg/dL - 0.79 0.80 0.91 -  GFR >60.00 mL/min - - - 65.82 -   BP/Weight 06/27/2021 01/10/2021 11/10/2020 11/10/2020 3/81/8299  Systolic BP 371 696 789 381 017  Diastolic BP 98 82 80 92 70  Wt. (Lbs) - 211.4 201 - 201  BMI - 32.14 30.56 - 30.56   Foot/eye exam completion dates Latest Ref Rng & Units 02/27/2021 01/10/2021  Eye Exam No Retinopathy No Retinopathy -  Foot Form Completion - - Done    Nikyah  reports that she has been smoking cigarettes. She started smoking about 42 years ago. She has a 21.00 pack-year smoking history. She has never used smokeless tobacco. She reports that she does not drink alcohol and does not use drugs.     Assessment & Plan:    Chronic anticoagulation  Elevated LDL cholesterol level  Smoker  Rx changes: {none:33079} Education: Reviewed 'ABCs' of diabetes management (respective goals in parentheses):  A1C (<7), blood pressure (<130/80), and cholesterol (LDL <100). Compliance at present is estimated to be {good/fair/poor:33178}. Efforts to improve compliance (if necessary) will be directed at {compliance:16716}. Follow up: {NUMBERS; 0-10:33138} {time:11}

## 2021-06-27 NOTE — Progress Notes (Signed)
   Subjective:    Patient ID: Rhonda Hudson, female    DOB: July 20, 1956, 65 y.o.   MRN: [id number]  HPI Chief Complaint  Patient presents with   Diabetes   She is here today for a medication management visit.  Her diabetes is managed by Dr. Loanne Drilling. She has an appointment in 2-3 weeks with him.   She reports taking Xarelto daily without any concerns. No sign of bleeding.   Taking statin daily at bedtime.   She is involved in her church and is going to church from here today. Her brother lives in [place] and she is close to him.  She has a niece in [place] and she recently was able to go visit her.   Denies fever, chills, dizziness, chest pain, palpitations, shortness of breath, abdominal pain, N/V/D, urinary symptoms.     Review of Systems Pertinent positives and negatives in the history of present illness.     Objective:   Physical Exam BP (!) 140/98 (BP Location: Right Arm, Patient Position: Sitting)   Pulse 98   SpO2 98%         Assessment & Plan:  Chronic anticoagulation  Elevated LDL cholesterol level  Smoker  Needs flu shot - Plan: Flu Vaccine QUAD High Dose(Fluad)  She is aware her BP is elevated today and states she is stressed and anxious and not surprised. BP is not normally elevated per patient.  She will continue on Xarelto indefinitely, doing well on this.  Continue statin therapy.  Not willing to stop smoking.  Declines labs.  She will see her endocrinologist in 2-3 weeks.  Follow up in 3 months or sooner if needed.

## 2021-07-11 DIAGNOSIS — Z08 Encounter for follow-up examination after completed treatment for malignant neoplasm: Secondary | ICD-10-CM | POA: Diagnosis not present

## 2021-07-11 DIAGNOSIS — Z85828 Personal history of other malignant neoplasm of skin: Secondary | ICD-10-CM | POA: Diagnosis not present

## 2021-07-11 DIAGNOSIS — L309 Dermatitis, unspecified: Secondary | ICD-10-CM | POA: Diagnosis not present

## 2021-07-11 DIAGNOSIS — L570 Actinic keratosis: Secondary | ICD-10-CM | POA: Diagnosis not present

## 2021-07-11 DIAGNOSIS — L718 Other rosacea: Secondary | ICD-10-CM | POA: Diagnosis not present

## 2021-07-11 DIAGNOSIS — L821 Other seborrheic keratosis: Secondary | ICD-10-CM | POA: Diagnosis not present

## 2021-07-18 ENCOUNTER — Ambulatory Visit: Payer: Medicare HMO | Admitting: Endocrinology

## 2021-08-02 ENCOUNTER — Ambulatory Visit (INDEPENDENT_AMBULATORY_CARE_PROVIDER_SITE_OTHER): Payer: Medicare HMO | Admitting: Endocrinology

## 2021-08-02 ENCOUNTER — Other Ambulatory Visit: Payer: Self-pay

## 2021-08-02 VITALS — BP 120/80 | HR 83 | Ht 68.0 in | Wt 211.0 lb

## 2021-08-02 DIAGNOSIS — E119 Type 2 diabetes mellitus without complications: Secondary | ICD-10-CM

## 2021-08-02 DIAGNOSIS — E041 Nontoxic single thyroid nodule: Secondary | ICD-10-CM | POA: Diagnosis not present

## 2021-08-02 LAB — POCT GLYCOSYLATED HEMOGLOBIN (HGB A1C): Hemoglobin A1C: 6.5 % — AB (ref 4.0–5.6)

## 2021-08-02 MED ORDER — METFORMIN HCL ER 500 MG PO TB24: 1000.0000 mg | ORAL_TABLET | Freq: Every day | ORAL | 3 refills | Status: DC

## 2021-08-02 NOTE — Patient Instructions (Addendum)
I have sent a prescription to your pharmacy, to reduce the metformin to 2 pills per day.   Let's recheck the ultrasound.  you will receive a phone call, about a day and time for an appointment.  Please come back for a follow-up appointment in 6 months.

## 2021-08-02 NOTE — Progress Notes (Signed)
Subjective:    Patient ID: Rhonda Hudson, female    DOB: [birth year], 65 y.o.   MRN: [id number]  HPI Pt returns for f/u of diabetes mellitus:  DM type: 2 Dx'ed: 4742 Complications: none Therapy: metformin GDM: never DKA: never Severe hypoglycemia: never Pancreatitis: never Pancreatic imaging: normal on 2010 CT Other: she has never taken insulin.  Interval history: she takes metformin as rx'ed, but she says it causes diarrhea.   Pt also resturns for f/u of MNG (dx'ed 1981; a cyst was drained then; she then took an uncertain medication x 1 year, but not since; [place] (2020) showed 5.5 cm right lobe cyst; Left upper thyroid nodule (labeled 2) meets criteria for biopsy.  Left lower thyroid nodule (labeled 3) meets criteria for surveillance; Cytology (2020) was cat 1, and (2021) was cat 2). Pt says the goiter seems unchanged.  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/03/2019   Panic attacks    Hx of   Thickened endometrium    Thyroid nodule 03/03/2019    Past Surgical History:  Procedure Laterality Date   CHOLECYSTECTOMY     CYSTOSCOPY  11/04/2019   Procedure: CYSTOSCOPY;  Surgeon: Azucena Fallen, MD;  Location: [facility];  Service: Gynecology;;   DILATION AND CURETTAGE OF UTERUS N/A 06/16/2018   Procedure: DILATATION AND CURETTAGE;  Surgeon: Emily Filbert, MD;  Location: [facility];  Service: Gynecology;  Laterality: N/A;   ROBOTIC  ASSISTED TOTAL HYSTERECTOMY WITH BILATERAL SALPINGO OOPHERECTOMY Bilateral 11/04/2019   Procedure: XI ROBOTIC ASSISTED TOTAL HYSTERECTOMY WITH BILATERAL SALPINGO OOPHORECTOMY/Pelvic Washings;  Surgeon: Azucena Fallen, MD;  Location: [facility];  Service: Gynecology;  Laterality: Bilateral;  Tracie to RNFA confirmed on 10/15/19 CS   TOE SURGERY     TUBAL LIGATION     US ECHOCARDIOGRAPHY  01-31-2009   EF 60%    Social History   Socioeconomic History   Marital status: Divorced    Spouse name: Not on file   Number of children: 2   Years of education: 12   Highest education level: 12th grade  Occupational History   Occupation: Social Security Disability  Tobacco Use   Smoking status: Every Day    Packs/day: 0.50    Years: 42.00    Pack years: 21.00    Types: Cigarettes    Start date: 1980   Smokeless tobacco: Never  Vaping Use   Vaping Use: Never used  Substance and Sexual Activity   Alcohol use: No    Alcohol/week: 0.0 standard drinks   Drug use: No   Sexual activity: Not Currently    Birth control/protection: Post-menopausal  Other Topics Concern   Not on file  Social History Narrative   Lives with two sons   Caffeine use: coffee and coke daily   Right-handed   Social Determinants of Health   Financial Resource Strain:  Not on file  Food Insecurity: Not on file  Transportation Needs: Not on file  Physical Activity: Not on file  Stress: Not on file  Social Connections: Not on file  Intimate Partner Violence: Not on file    Current Outpatient Medications on File Prior to Visit  Medication Sig Dispense Refill   acetaminophen (TYLENOL) 500 MG tablet Take 2 tablets (1,000 mg total) by mouth every 6 (six) hours. 30 tablet 0   atorvastatin (LIPITOR) 10 MG tablet TAKE 1 TABLET EVERY DAY 90 tablet 1   XARELTO 10 MG TABS tablet TAKE 1 TABLET EVERY DAY 90 tablet 0   No current facility-administered medications on file prior to visit.    No Known  Allergies  Family History  Problem Relation Age of Onset   Diabetes Mother    Leukemia Father    Leukemia Sister    Diabetes Sister    Leukemia Brother    Multiple sclerosis Neg Hx    Autoimmune disease Neg Hx    Thyroid disease Neg Hx     BP 120/80 (BP Location: Right Arm, Patient Position: Sitting, Cuff Size: Large)   Pulse 83   Ht 5\' 8"  (1.727 m)   Wt 211 lb (95.7 kg)   SpO2 97%   BMI 32.08 kg/m    Review of Systems     Objective:   Physical Exam VITAL SIGNS:  See vs page GENERAL: no distress NECK: thyroid is slightly enlarged, with irreg surface.     Lab Results  Component Value Date   HGBA1C 6.5 (A) 08/02/2021       Assessment & Plan:  Type 2 DM: uncontrolled.  I advised her to add another med, but she declines MNG: due for recheck.  Patient Instructions  I have sent a prescription to your pharmacy, to reduce the metformin to 2 pills per day.   Let's recheck the ultrasound.  you will receive a phone call, about a day and time for an appointment.  Please come back for a follow-up appointment in 6 months.

## 2021-08-22 ENCOUNTER — Ambulatory Visit (HOSPITAL_COMMUNITY)
Admission: RE | Admit: 2021-08-22 | Discharge: 2021-08-22 | Disposition: A | Discharge status: Home / Self Care | Payer: Medicare HMO | Source: Ambulatory Visit | Attending: Endocrinology | Admitting: Endocrinology

## 2021-08-22 ENCOUNTER — Other Ambulatory Visit: Payer: Self-pay

## 2021-08-22 DIAGNOSIS — E041 Nontoxic single thyroid nodule: Secondary | ICD-10-CM | POA: Diagnosis not present

## 2021-08-22 DIAGNOSIS — E049 Nontoxic goiter, unspecified: Secondary | ICD-10-CM | POA: Diagnosis not present

## 2021-10-02 ENCOUNTER — Encounter: Payer: Self-pay | Admitting: Physician Assistant

## 2021-10-10 ENCOUNTER — Telehealth: Payer: Self-pay | Admitting: Medical

## 2021-10-10 DIAGNOSIS — E7800 Pure hypercholesterolemia, unspecified: Secondary | ICD-10-CM

## 2021-10-10 MED ORDER — ATORVASTATIN CALCIUM 10 MG PO TABS
ORAL_TABLET | ORAL | 0 refills | Status: DC
Start: 2021-10-10 — End: 2021-12-25

## 2021-10-10 NOTE — Telephone Encounter (Signed)
[facility] sent request for atorvastatin please send to the [facility], [place]

## 2021-10-10 NOTE — Telephone Encounter (Signed)
done

## 2021-11-01 ENCOUNTER — Telehealth: Payer: Self-pay | Admitting: Internal Medicine
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
## 2021-11-07 ENCOUNTER — Other Ambulatory Visit: Payer: Self-pay

## 2021-11-07 MED ORDER — RIVAROXABAN 10 MG PO TABS
10.0000 mg | ORAL_TABLET | Freq: Every day | ORAL | 0 refills | Status: DC
Start: 2021-11-07 — End: 2022-01-23

## 2021-11-14 ENCOUNTER — Ambulatory Visit (INDEPENDENT_AMBULATORY_CARE_PROVIDER_SITE_OTHER): Payer: Medicare HMO | Admitting: Medical

## 2021-11-14 ENCOUNTER — Encounter: Payer: Self-pay | Admitting: Medical

## 2021-11-14 ENCOUNTER — Other Ambulatory Visit: Payer: Self-pay

## 2021-11-14 VITALS — BP 120/80 | HR 81 | Ht 69.0 in | Wt 204.0 lb

## 2021-11-14 DIAGNOSIS — E2839 Other primary ovarian failure: Secondary | ICD-10-CM | POA: Diagnosis not present

## 2021-11-14 DIAGNOSIS — F32A Depression, unspecified: Secondary | ICD-10-CM

## 2021-11-14 DIAGNOSIS — E118 Type 2 diabetes mellitus with unspecified complications: Secondary | ICD-10-CM

## 2021-11-14 DIAGNOSIS — Z780 Asymptomatic menopausal state: Secondary | ICD-10-CM | POA: Diagnosis not present

## 2021-11-14 DIAGNOSIS — Z1211 Encounter for screening for malignant neoplasm of colon: Secondary | ICD-10-CM

## 2021-11-14 DIAGNOSIS — Z7901 Long term (current) use of anticoagulants: Secondary | ICD-10-CM

## 2021-11-14 DIAGNOSIS — Z8639 Personal history of other endocrine, nutritional and metabolic disease: Secondary | ICD-10-CM | POA: Diagnosis not present

## 2021-11-14 DIAGNOSIS — Z0000 Encounter for general adult medical examination without abnormal findings: Secondary | ICD-10-CM

## 2021-11-14 DIAGNOSIS — E119 Type 2 diabetes mellitus without complications: Secondary | ICD-10-CM

## 2021-11-14 DIAGNOSIS — F17200 Nicotine dependence, unspecified, uncomplicated: Secondary | ICD-10-CM | POA: Diagnosis not present

## 2021-11-14 DIAGNOSIS — Z90710 Acquired absence of both cervix and uterus: Secondary | ICD-10-CM

## 2021-11-14 DIAGNOSIS — Z1231 Encounter for screening mammogram for malignant neoplasm of breast: Secondary | ICD-10-CM

## 2021-11-14 DIAGNOSIS — I82592 Chronic embolism and thrombosis of other specified deep vein of left lower extremity: Secondary | ICD-10-CM

## 2021-11-14 DIAGNOSIS — F419 Anxiety disorder, unspecified: Secondary | ICD-10-CM

## 2021-11-14 NOTE — Patient Instructions (Signed)
?This visit was a preventative care visit, also known as wellness visit or routine physical.   Topics typically include healthy lifestyle, diet, exercise, preventative care, vaccinations, sick and well care, proper use of emergency dept and after hours care, as well as other concerns.   ? ? ?Recommendations: ?Continue to return yearly for your annual wellness and preventative care visits.  This gives [place] a chance to discuss healthy lifestyle, exercise, vaccinations, review your chart record, and perform screenings where appropriate. ? ?I recommend you see your eye doctor yearly for routine vision care. ? ?I recommend you see your dentist yearly for routine dental care including hygiene visits twice yearly. ? ? ?Vaccination recommendations were reviewed ?Immunization History  ?Administered Date(s) Administered  ? Fluad Quad(high Dose 65+) 05/12/2019, 06/21/2020, 06/27/2021  ? Influenza,inj,Quad PF,6+ Mos 05/30/2016, 05/14/2017  ? Pneumococcal Polysaccharide-23 10/27/2019  ? Tdap 01/21/2017  ? ? ?I recommend a Prevnar 20 vaccine and Shingrix vaccine.  Check your insurance for coverage for these. ? ? ?Screening for cancer: ?Colon cancer screening: ?You stated that you had a recent test through insurance.  We will try to get a copy of this.  Per your description this may have been the Cologuard test. ? ? ?Breast cancer screening: ?You should perform a self breast exam monthly.   ?We reviewed recommendations for regular mammograms and breast cancer screening. ?Call and schedule your mammogram ? ? ?[facility]  ?[phone] ?[address] ?[place], [place] [address] ? ? ? ?Cervical cancer screening: ?We reviewed recommendations for pap smear screening.  You are status post hysterectomy ?  ?Skin cancer screening: ?Check your skin regularly for new changes, growing lesions, or other lesions of concern ?Come in for evaluation if you have skin lesions of concern. ? ?Lung cancer  screening: ?If you have a greater than 20 pack year history of tobacco use, then you may qualify for lung cancer screening with a chest CT scan.   Please call your insurance company to inquire about coverage for this test. ? ?We currently don't have screenings for other cancers besides breast, cervical, colon, and lung cancers.  If you have a strong family history of cancer or have other cancer screening concerns, please let me know.  ? ? ?Bone health: ?Get at least 150 minutes of aerobic exercise weekly ?Get weight bearing exercise at least once weekly ?Bone density test:  ?A bone density test is an imaging test that uses a type of X-ray to measure the amount of calcium and other minerals in your bones. ?The test may be used to diagnose or screen you for a condition that causes weak or thin bones (osteoporosis), predict your risk for a broken bone (fracture), or determine how well your osteoporosis treatment is working. ?The bone density test is recommended for females 46 and older, or females or males <50 if certain risk factors such as thyroid disease, long term use of steroids such as for asthma or rheumatological issues, vitamin D deficiency, estrogen deficiency, family history of osteoporosis, self or family history of fragility fracture in first degree relative. ? ?Consider scheduling a bone density test at the breast center ? ? ? ?Heart health: ?Get at least 150 minutes of aerobic exercise weekly ?Limit alcohol ?It is important to maintain a healthy blood pressure and healthy cholesterol numbers ? ?Heart disease screening: ?Screening for heart disease includes screening for blood pressure, fasting lipids, glucose/diabetes screening, BMI height to weight ratio, reviewed of smoking status, physical  activity, and diet.   ? ?Goals include blood pressure 120/80 or less, maintaining a healthy lipid/cholesterol profile, preventing diabetes or keeping diabetes numbers under good control, not smoking or using tobacco  products, exercising most days per week or at least 150 minutes per week of exercise, and eating healthy variety of fruits and vegetables, healthy oils, and avoiding unhealthy food choices like fried food, fast food, high sugar and high cholesterol foods.   ? ?Other tests may possibly include EKG test, CT coronary calcium score, echocardiogram, exercise treadmill stress test.  ? ? ? ?Medical care options: ?I recommend you continue to seek care here first for routine care.  We try really hard to have available appointments Monday through Friday daytime hours for sick visits, acute visits, and physicals.  Urgent care should be used for after hours and weekends for significant issues that cannot wait till the next day.  The emergency department should be used for significant potentially life-threatening emergencies.  The emergency department is expensive, can often have long wait times for less significant concerns, so try to utilize primary care, urgent care, or telemedicine when possible to avoid unnecessary trips to the emergency department.  Virtual visits and telemedicine have been introduced since the pandemic started in 2020, and can be convenient ways to receive medical care.  We offer virtual appointments as well to assist you in a variety of options to seek medical care. ? ? ?Advanced Directives: ?I recommend you consider completing a [place] and Living Will.   These documents respect your wishes and help alleviate burdens on your loved ones if you were to become terminally ill or be in a position to need those documents enforced.   ? ?You can complete Advanced Directives yourself, have them notarized, then have copies made for our office, for you and for anybody you feel should have them in safe keeping. ? ?Or, you can have an attorney prepare these documents.   If you haven't updated your Last Will and Testament in a while, it may be worthwhile having an attorney prepare these  documents together and save on some costs.     ? ? ? ?Separate significant issues: ?Diabetes-managed by endocrinology ? ?History of DVT, long-term anticoagulation-continue Xarelto lifelong ? ?Anxiety and depression-consider counseling ? ?Tobacco use-I recommend you quit smoking due to risk of lung cancer and COPD ? ?History of hypothyroid issues-updated labs today ? ?Family stress-I recommend you talk with a counselor about your situation or consider speaking to an attorney or social services to assist in your situation. ?
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
## 2021-11-15 ENCOUNTER — Other Ambulatory Visit: Payer: Self-pay | Admitting: Medical

## 2021-11-15 LAB — COMPREHENSIVE METABOLIC PANEL
ALT: 15 IU/L (ref 0–32)
AST: 14 IU/L (ref 0–40)
Albumin/Globulin Ratio: 1.8 (ref 1.2–2.2)
Albumin: 4.2 g/dL (ref 3.8–4.8)
Alkaline Phosphatase: 94 IU/L (ref 44–121)
BUN/Creatinine Ratio: 8 — ABNORMAL LOW (ref 12–28)
BUN: 7 mg/dL — ABNORMAL LOW (ref 8–27)
Bilirubin Total: 0.3 mg/dL (ref 0.0–1.2)
CO2: 21 mmol/L (ref 20–29)
Calcium: 9.8 mg/dL (ref 8.7–10.3)
Chloride: 102 mmol/L (ref 96–106)
Creatinine, Ser: 0.86 mg/dL (ref 0.57–1.00)
Globulin, Total: 2.4 g/dL (ref 1.5–4.5)
Glucose: 92 mg/dL (ref 70–99)
Potassium: 4.9 mmol/L (ref 3.5–5.2)
Sodium: 138 mmol/L (ref 134–144)
Total Protein: 6.6 g/dL (ref 6.0–8.5)
eGFR: 74 mL/min/{1.73_m2} (ref >59)

## 2021-11-15 LAB — CBC
Hematocrit: 43.9 % (ref 34.0–46.6)
Hemoglobin: 14.6 g/dL (ref 11.1–15.9)
MCH: 29.1 pg (ref 26.6–33.0)
MCHC: 33.3 g/dL (ref 31.5–35.7)
MCV: 88 fL (ref 79–97)
Platelets: 244 10*3/uL (ref 150–450)
RBC: 5.02 x10E6/uL (ref 3.77–5.28)
RDW: 13.4 % (ref 11.7–15.4)
WBC: 8.6 10*3/uL (ref 3.4–10.8)

## 2021-11-15 LAB — LIPID PANEL
Chol/HDL Ratio: 3.3 ratio (ref 0.0–4.4)
Cholesterol, Total: 149 mg/dL (ref 100–199)
HDL: 45 mg/dL (ref >39)
LDL Chol Calc (NIH): 77 mg/dL (ref 0–99)
Triglycerides: 158 mg/dL — ABNORMAL HIGH (ref 0–149)
VLDL Cholesterol Cal: 27 mg/dL (ref 5–40)

## 2021-11-15 LAB — MICROALBUMIN / CREATININE URINE RATIO
Creatinine, Urine: 26 mg/dL
Microalb/Creat Ratio: <12 mg/g creat (ref 0–29)
Microalbumin, Urine: <3 ug/mL

## 2021-11-15 LAB — TSH+FREE T4
Free T4: 1.32 ng/dL (ref 0.82–1.77)
TSH: 0.678 u[IU]/mL (ref 0.450–4.500)

## 2021-11-20 ENCOUNTER — Other Ambulatory Visit: Payer: Self-pay | Admitting: Medical

## 2021-11-20 DIAGNOSIS — Z0000 Encounter for general adult medical examination without abnormal findings: Secondary | ICD-10-CM

## 2021-11-20 DIAGNOSIS — E2839 Other primary ovarian failure: Secondary | ICD-10-CM

## 2021-11-20 DIAGNOSIS — Z780 Asymptomatic menopausal state: Secondary | ICD-10-CM

## 2021-11-20 DIAGNOSIS — Z1231 Encounter for screening mammogram for malignant neoplasm of breast: Secondary | ICD-10-CM

## 2021-12-25 ENCOUNTER — Other Ambulatory Visit: Payer: Self-pay | Admitting: Medical

## 2021-12-25 DIAGNOSIS — E7800 Pure hypercholesterolemia, unspecified: Secondary | ICD-10-CM

## 2022-01-09 DIAGNOSIS — L570 Actinic keratosis: Secondary | ICD-10-CM | POA: Diagnosis not present

## 2022-01-09 DIAGNOSIS — D2239 Melanocytic nevi of other parts of face: Secondary | ICD-10-CM | POA: Diagnosis not present

## 2022-01-09 DIAGNOSIS — L821 Other seborrheic keratosis: Secondary | ICD-10-CM | POA: Diagnosis not present

## 2022-01-09 DIAGNOSIS — D492 Neoplasm of unspecified behavior of bone, soft tissue, and skin: Secondary | ICD-10-CM | POA: Diagnosis not present

## 2022-01-23 ENCOUNTER — Other Ambulatory Visit: Payer: Self-pay | Admitting: Medical

## 2022-01-31 ENCOUNTER — Ambulatory Visit: Payer: Medicare HMO | Admitting: Endocrinology

## 2022-02-05 ENCOUNTER — Encounter: Payer: Self-pay | Admitting: Medical

## 2022-02-05 ENCOUNTER — Ambulatory Visit (INDEPENDENT_AMBULATORY_CARE_PROVIDER_SITE_OTHER): Payer: Medicare HMO | Admitting: Medical

## 2022-02-05 VITALS — BP 130/80 | HR 72 | Ht 68.0 in

## 2022-02-05 DIAGNOSIS — K921 Melena: Secondary | ICD-10-CM | POA: Diagnosis not present

## 2022-02-05 DIAGNOSIS — R159 Full incontinence of feces: Secondary | ICD-10-CM | POA: Diagnosis not present

## 2022-02-05 DIAGNOSIS — E119 Type 2 diabetes mellitus without complications: Secondary | ICD-10-CM

## 2022-02-05 DIAGNOSIS — Z90710 Acquired absence of both cervix and uterus: Secondary | ICD-10-CM | POA: Diagnosis not present

## 2022-02-05 DIAGNOSIS — N3281 Overactive bladder: Secondary | ICD-10-CM

## 2022-02-05 DIAGNOSIS — F17200 Nicotine dependence, unspecified, uncomplicated: Secondary | ICD-10-CM

## 2022-02-05 DIAGNOSIS — Z8639 Personal history of other endocrine, nutritional and metabolic disease: Secondary | ICD-10-CM | POA: Diagnosis not present

## 2022-02-05 LAB — POCT GLYCOSYLATED HEMOGLOBIN (HGB A1C): Hemoglobin A1C: 5.9 % — AB (ref 4.0–5.6)

## 2022-02-05 NOTE — Progress Notes (Signed)
Subjective:  Rhonda Hudson is a 66 y.o. female who presents for Chief Complaint  Patient presents with   Consult    Patient states she was told many years ago she might have Crohn's. She is having issues controlling her bowel movements. Cannot make it to the bathroom in time and is soiling her clothing and having to shower multiple times a day and is very frustrated.      Here for concern about bowels.  She notes that she has had long-term bowel problems for years.  There was a concern years ago that she might have Crohn's disease.  But lately just seems to be worse and she is tired of dealing with it.  She notes rarely having accidents with her bowels.  By the time she has stomach upset or urge to go she cannot make it to the bathroom.  She soils her clothes multiple times throughout the day.  She is taking multiple showers a day because of the soiling of her clothes.   She notes that it would be easier just to have a hole in her recliner so she could just sit there because it comes out at will sometimes.  She has frequent bouts of stool without the ability to control her stooling.  Lately she has even been seeing blood in the stool.  Sometimes has abdominal upset abdominal pain.  She does not note a lot of gassiness though.  In general she avoids any acidic or spicy foods.  This has been an ongoing problem for years  She can even leave the house due to the frequent accidents.  As a matter fact she has fasted since yesterday morning just that she did not have any food in her colon so she did have an accident coming here to the office.  Regarding diabetes she is taking metformin, 2 tablets daily.  She does not think this is triggering the symptoms as she has had this for so long even before metformin.  She notes history of overactive bladder.  She does not control her urine that well either but cannot control it or so that her bowels.  She has history of low back pain and herniated disc.  No numbness  or tingling in legs.  No leg weakness.  No concern for her walking.  She has been seeing Dr. Loanne Drilling for endocrinology but just learned that he left his practice  No other aggravating or relieving factors.    No other c/o.  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     metFORMIN (GLUCOPHAGE-XR) 500 MG 24 hr tablet Take 2 tablets (1,000 mg total) by mouth daily. 180 tablet 3   XARELTO 10 MG TABS tablet TAKE 1 TABLET (10 MG TOTAL) BY MOUTH DAILY. 90 tablet 0   No current facility-administered medications on file prior to visit.     The following portions of the patient's history were reviewed and updated as appropriate: allergies, current medications, past family history, past medical history, past social history, past surgical history and problem list.  ROS Otherwise as in subjective above  Objective: BP 130/80   Pulse 72   Ht '5\' 8"'$  (1.727 m)   BMI 31.02 kg/m   General appearance: alert, no distress, well developed, well nourished Abdomen: +bs, soft, non tender, non distended, no masses, no hepatomegaly,  no splenomegaly Pulses: 2+ radial pulses, 2+ pedal pulses, normal cap refill Ext: no edema   Assessment: Encounter Diagnoses  Name Primary?   Incontinence of feces, unspecified fecal incontinence type Yes   OAB (overactive bladder)    Diabetes mellitus without complication ([place])    Smoker    S/P laparoscopic hysterectomy    History of hypothyroidism    Blood in stool      Plan: We discussed her symptoms and concerns.  Referral to gastroenterology for further evaluation.  Her hemoglobin A1c is improved.  Since metformin can aggravate loose stools I asked her to go back to 1 tablet a day for now.  I reviewed her recent blood work from March 2023 in the chart record.  Labs are stable then.  No signs of kidney abnormality.  She is compliant with her medications in general.  History of hypothyroidism not currently on medicine, recent labs were normal.   [name] was seen today for consult.  Diagnoses and all orders for this visit:  Incontinence of feces, unspecified fecal incontinence type -     Ambulatory referral to Gastroenterology  OAB (overactive bladder)  Diabetes mellitus without complication ([place]) -     HgB A1c  Smoker  S/P laparoscopic hysterectomy  History of hypothyroidism  Blood in stool    Follow up: pending referral

## 2022-02-06 DIAGNOSIS — Z961 Presence of intraocular lens: Secondary | ICD-10-CM | POA: Diagnosis not present

## 2022-02-06 DIAGNOSIS — H26492 Other secondary cataract, left eye: Secondary | ICD-10-CM | POA: Diagnosis not present

## 2022-02-07 DIAGNOSIS — H26492 Other secondary cataract, left eye: Secondary | ICD-10-CM | POA: Diagnosis not present

## 2022-02-22 DIAGNOSIS — H524 Presbyopia: Secondary | ICD-10-CM | POA: Diagnosis not present

## 2022-03-08 ENCOUNTER — Encounter: Payer: Self-pay | Admitting: Nurse Practitioner

## 2022-03-08 ENCOUNTER — Other Ambulatory Visit (INDEPENDENT_AMBULATORY_CARE_PROVIDER_SITE_OTHER): Payer: Medicare HMO

## 2022-03-08 ENCOUNTER — Ambulatory Visit: Payer: Medicare HMO | Admitting: Nurse Practitioner

## 2022-03-08 ENCOUNTER — Telehealth: Payer: Self-pay

## 2022-03-08 VITALS — BP 128/70 | HR 92 | Ht 68.0 in | Wt 205.2 lb

## 2022-03-08 DIAGNOSIS — R159 Full incontinence of feces: Secondary | ICD-10-CM | POA: Diagnosis not present

## 2022-03-08 DIAGNOSIS — R197 Diarrhea, unspecified: Secondary | ICD-10-CM | POA: Diagnosis not present

## 2022-03-08 DIAGNOSIS — Z86718 Personal history of other venous thrombosis and embolism: Secondary | ICD-10-CM

## 2022-03-08 LAB — COMPREHENSIVE METABOLIC PANEL
ALT: 21 U/L (ref 0–35)
AST: 16 U/L (ref 0–37)
Albumin: 4.4 g/dL (ref 3.5–5.2)
Alkaline Phosphatase: 86 U/L (ref 39–117)
BUN: 7 mg/dL (ref 6–23)
CO2: 26 mEq/L (ref 19–32)
Calcium: 10.4 mg/dL (ref 8.4–10.5)
Chloride: 101 mEq/L (ref 96–112)
Creatinine, Ser: 0.87 mg/dL (ref 0.40–1.20)
GFR: 68.66 mL/min (ref >60.00)
Glucose, Bld: 112 mg/dL — ABNORMAL HIGH (ref 70–99)
Potassium: 4.2 mEq/L (ref 3.5–5.1)
Sodium: 137 mEq/L (ref 135–145)
Total Bilirubin: 0.6 mg/dL (ref 0.2–1.2)
Total Protein: 7.7 g/dL (ref 6.0–8.3)

## 2022-03-08 LAB — CBC
HCT: 46.3 % — ABNORMAL HIGH (ref 36.0–46.0)
Hemoglobin: 15.5 g/dL — ABNORMAL HIGH (ref 12.0–15.0)
MCHC: 33.6 g/dL (ref 30.0–36.0)
MCV: 88.5 fl (ref 78.0–100.0)
Platelets: 208 10*3/uL (ref 150.0–400.0)
RBC: 5.23 Mil/uL — ABNORMAL HIGH (ref 3.87–5.11)
RDW: 13.9 % (ref 11.5–15.5)
WBC: 9.6 10*3/uL (ref 4.0–10.5)

## 2022-03-08 LAB — C-REACTIVE PROTEIN: CRP: <1 mg/dL (ref 0.5–20.0)

## 2022-03-08 MED ORDER — NA SULFATE-K SULFATE-MG SULF 17.5-3.13-1.6 GM/177ML PO SOLN
1.0000 | ORAL | 0 refills | Status: DC
Start: 2022-03-08 — End: 2022-03-22

## 2022-03-22 ENCOUNTER — Encounter: Payer: Self-pay | Admitting: Gastroenterology

## 2022-03-22 ENCOUNTER — Ambulatory Visit (AMBULATORY_SURGERY_CENTER): Payer: Medicare HMO | Admitting: Gastroenterology

## 2022-03-22 VITALS — BP 115/67 | HR 69 | Temp 96.8°F | Resp 13 | Ht 68.0 in | Wt 205.0 lb

## 2022-03-22 DIAGNOSIS — K5730 Diverticulosis of large intestine without perforation or abscess without bleeding: Secondary | ICD-10-CM

## 2022-03-22 DIAGNOSIS — R194 Change in bowel habit: Secondary | ICD-10-CM

## 2022-03-22 DIAGNOSIS — R197 Diarrhea, unspecified: Secondary | ICD-10-CM | POA: Diagnosis not present

## 2022-03-22 DIAGNOSIS — R159 Full incontinence of feces: Secondary | ICD-10-CM | POA: Diagnosis not present

## 2022-03-22 MED ORDER — SODIUM CHLORIDE 0.9 % IV SOLN: 500.0000 mL | Freq: Once | INTRAVENOUS | Status: DC

## 2022-03-22 NOTE — Progress Notes (Unsigned)
  The recent H&P (dated 03/08/22) was reviewed, the patient was examined and there is no change in the patients condition since that H&P was completed.   Rhonda Hudson  03/22/2022, 2:36 PM

## 2022-03-22 NOTE — Progress Notes (Unsigned)
Called to room to assist during endoscopic procedure.  Patient ID and intended procedure confirmed with present staff. Received instructions for my participation in the procedure from the performing physician.  

## 2022-03-22 NOTE — Op Note (Addendum)
[facility] Patient Name: Rhonda Hudson Procedure Date: 03/22/2022 3:06 PM MRN: [id number] Endoscopist: Milus Banister , MD Age: 66 Referring MD:  Date of Birth: [birth year] Gender: Female Account #: [id number] Procedure:                Colonoscopy Indications:              Chronic loose stools, recent fecal incontinence Medicines:                Monitored Anesthesia Care Procedure:                Pre-Anesthesia Assessment:                           - Prior to the procedure, a History and Physical                            was performed, and patient medications and                            allergies were reviewed. The patient's tolerance of                            previous anesthesia was also reviewed. The risks                            and benefits of the procedure and the sedation                            options and risks were discussed with the patient.                            All questions were answered, and informed consent                            was obtained. Prior Anticoagulants: The patient has                            taken Xarelto (rivaroxaban), last dose was 3 days                            prior to procedure. ASA Grade Assessment: II - A                            patient with mild systemic disease. After reviewing                            the risks and benefits, the patient was deemed in                            satisfactory condition to undergo the procedure.                           After obtaining informed consent, the colonoscope  was passed under direct vision. Throughout the                            procedure, the patient's blood pressure, pulse, and                            oxygen saturations were monitored continuously. The                            Olympus PCF-[id number] (#[id number]) Colonoscope was                            introduced through the anus and advanced to the the                             terminal ileum. The colonoscopy was performed                            without difficulty. The patient tolerated the                            procedure well. The quality of the bowel                            preparation was good. The terminal ileum, ileocecal                            valve, appendiceal orifice, and rectum were                            photographed. Scope In: 3:11:48 PM Scope Out: 3:21:04 PM Scope Withdrawal Time: 0 hours 6 minutes 24 seconds  Total Procedure Duration: 0 hours 9 minutes 16 seconds  Findings:                 The terminal ileum appeared normal.                           A few small-mouthed diverticula were found in the                            left colon.                           Biopsies for histology were taken with a cold                            forceps from the entire colon for evaluation of                            microscopic colitis.                           The exam was otherwise without abnormality on  direct and retroflexion views. Complications:            No immediate complications. Estimated blood loss:                            None. Estimated Blood Loss:     Estimated blood loss: none. Impression:               - The examined portion of the ileum was normal.                           - Diverticulosis in the left colon.                           - The examination was otherwise normal on direct                            and retroflexion views.                           - Biopsies were taken with a cold forceps from the                            entire colon for evaluation of microscopic colitis. Recommendation:           - Patient has a contact number available for                            emergencies. The signs and symptoms of potential                            delayed complications were discussed with the                            patient. Return to normal activities tomorrow.                             Written discharge instructions were provided to the                            patient.                           - Resume previous diet.                           - Continue present medications. Please start OTC                            fiber supplement (citrucel or metamucil powder)                            once daily for at least 4-5 weeks and call Dr.                            Ardis Hughs' office in 4 weeks to report  on your                            response.                           - Await pathology results.                           - You can resume your bloo thinner tomorrow. Milus Banister, MD 03/22/2022 3:24:40 PM This report has been signed electronically.

## 2022-03-22 NOTE — Progress Notes (Unsigned)
A and O x3. Report to RN. Tolerated MAC anesthesia well. 

## 2022-03-22 NOTE — Patient Instructions (Signed)
Resume previous diet.  Continue present medications.  Handout given for diverticulosis.  Please start OTC fiber supplement (citrucel or metamucil powder)  once a day for at least 4-5 weeks and call Dr. Ardis Hughs office in 4 weeks to report on your response.  Await pathology results.  You can resume your blood thinner tomorrow.    YOU HAD AN ENDOSCOPIC PROCEDURE TODAY AT [facility]:   Refer to the procedure report that was given to you for any specific questions about what was found during the examination.  If the procedure report does not answer your questions, please call your gastroenterologist to clarify.  If you requested that your care partner not be given the details of your procedure findings, then the procedure report has been included in a sealed envelope for you to review at your convenience later.  YOU SHOULD EXPECT: Some feelings of bloating in the abdomen. Passage of more gas than usual.  Walking can help get rid of the air that was put into your GI tract during the procedure and reduce the bloating. If you had a lower endoscopy (such as a colonoscopy or flexible sigmoidoscopy) you may notice spotting of blood in your stool or on the toilet paper. If you underwent a bowel prep for your procedure, you may not have a normal bowel movement for a few days.  Please Note:  You might notice some irritation and congestion in your nose or some drainage.  This is from the oxygen used during your procedure.  There is no need for concern and it should clear up in a day or so.  SYMPTOMS TO REPORT IMMEDIATELY:  Following lower endoscopy (colonoscopy or flexible sigmoidoscopy):  Excessive amounts of blood in the stool  Significant tenderness or worsening of abdominal pains  Swelling of the abdomen that is new, acute  Fever of 100F or higher   For urgent or emergent issues, a gastroenterologist can be reached at any hour by calling [phone]. Do not use MyChart messaging  for urgent concerns.    DIET:  We do recommend a small meal at first, but then you may proceed to your regular diet.  Drink plenty of fluids but you should avoid alcoholic beverages for 24 hours.  ACTIVITY:  You should plan to take it easy for the rest of today and you should NOT DRIVE or use heavy machinery until tomorrow (because of the sedation medicines used during the test).    FOLLOW UP: Our staff will call the number listed on your records the next business day following your procedure.  We will call around 7:15- 8:00 am to check on you and address any questions or concerns that you may have regarding the information given to you following your procedure. If we do not reach you, we will leave a message.  If you develop any symptoms (ie: fever, flu-like symptoms, shortness of breath, cough etc.) before then, please call [phone].  If you test positive for Covid 19 in the 2 weeks post procedure, please call and report this information to [place].    If any biopsies were taken you will be contacted by phone or by letter within the next 1-3 weeks.  Please call us at [phone] if you have not heard about the biopsies in 3 weeks.    SIGNATURES/CONFIDENTIALITY: You and/or your care partner have signed paperwork which will be entered into your electronic medical record.  These signatures attest to the fact that that the information above on  your After Visit Summary has been reviewed and is understood.  Full responsibility of the confidentiality of this discharge information lies with you and/or your care-partner.

## 2022-03-27 ENCOUNTER — Encounter: Payer: Self-pay | Admitting: Gastroenterology

## 2022-04-24 ENCOUNTER — Ambulatory Visit: Payer: Medicare HMO

## 2022-04-24 ENCOUNTER — Other Ambulatory Visit: Payer: Medicare HMO

## 2022-05-02 ENCOUNTER — Ambulatory Visit: Payer: Medicare HMO | Admitting: Internal Medicine

## 2022-05-02 ENCOUNTER — Encounter: Payer: Self-pay | Admitting: Internal Medicine

## 2022-05-02 VITALS — BP 128/74 | HR 79 | Ht 68.0 in | Wt 202.8 lb

## 2022-05-02 DIAGNOSIS — E1165 Type 2 diabetes mellitus with hyperglycemia: Secondary | ICD-10-CM | POA: Diagnosis not present

## 2022-05-02 DIAGNOSIS — Z8639 Personal history of other endocrine, nutritional and metabolic disease: Secondary | ICD-10-CM

## 2022-05-02 DIAGNOSIS — E042 Nontoxic multinodular goiter: Secondary | ICD-10-CM

## 2022-05-02 LAB — POCT GLYCOSYLATED HEMOGLOBIN (HGB A1C): Hemoglobin A1C: 6.3 % — AB (ref 4.0–5.6)

## 2022-05-02 MED ORDER — ONETOUCH VERIO W/DEVICE KIT: PACK | 0 refills | Status: DC

## 2022-05-02 MED ORDER — METFORMIN HCL ER 500 MG PO TB24: 1000.0000 mg | ORAL_TABLET | Freq: Every day | ORAL | 3 refills | Status: DC

## 2022-05-02 MED ORDER — ONETOUCH ULTRASOFT LANCETS MISC: 3 refills | Status: DC

## 2022-05-02 MED ORDER — ONETOUCH VERIO VI STRP: ORAL_STRIP | 3 refills | Status: DC

## 2022-05-02 NOTE — Patient Instructions (Addendum)
Please continue: - Metformin ER 1000 mg in am  Start checking blood sugars 1x a day or at least every other day.  Please return in 6 months with your sugar log.   PATIENT INSTRUCTIONS FOR TYPE 2 DIABETES:  **Please join MyChart!** - see attached instructions about how to join if you have not done so already.  DIET AND EXERCISE Diet and exercise is an important part of diabetic treatment.  We recommended aerobic exercise in the form of brisk walking (working between 40-60% of maximal aerobic capacity, similar to brisk walking) for 150 minutes per week (such as 30 minutes five days per week) along with 3 times per week performing 'resistance' training (using various gauge rubber tubes with handles) 5-10 exercises involving the major muscle groups (upper body, lower body and core) performing 10-15 repetitions (or near fatigue) each exercise. Start at half the above goal but build slowly to reach the above goals. If limited by weight, joint pain, or disability, we recommend daily walking in a swimming pool with water up to waist to reduce pressure from joints while allow for adequate exercise.    BLOOD GLUCOSES Monitoring your blood glucoses is important for continued management of your diabetes. Please check your blood glucoses 2-4 times a day: fasting, before meals and at bedtime (you can rotate these measurements - e.g. one day check before the 3 meals, the next day check before 2 of the meals and before bedtime, etc.).   HYPOGLYCEMIA (low blood sugar) Hypoglycemia is usually a reaction to not eating, exercising, or taking too much insulin/ other diabetes drugs.  Symptoms include tremors, sweating, hunger, confusion, headache, etc. Treat IMMEDIATELY with 15 grams of Carbs: 4 glucose tablets  cup regular juice/soda 2 tablespoons raisins 4 teaspoons sugar 1 tablespoon honey Recheck blood glucose in 15 mins and repeat above if still symptomatic/blood glucose <100.  RECOMMENDATIONS TO REDUCE  YOUR RISK OF DIABETIC COMPLICATIONS: * Take your prescribed MEDICATION(S) * Follow a DIABETIC diet: Complex carbs, fiber rich foods, (monounsaturated and polyunsaturated) fats * AVOID saturated/trans fats, high fat foods, >2,300 mg salt per day. * EXERCISE at least 5 times a week for 30 minutes or preferably daily.  * DO NOT SMOKE OR DRINK more than 1 drink a day. * Check your FEET every day. Do not wear tightfitting shoes. Contact us if you develop an ulcer * See your EYE doctor once a year or more if needed * Get a FLU shot once a year * Get a PNEUMONIA vaccine once before and once after age 22 years  GOALS:  * Your Hemoglobin A1c of <7%  * fasting sugars need to be <130 * after meals sugars need to be <180 (2h after you start eating) * Your Systolic BP should be 765 or lower  * Your Diastolic BP should be 80 or lower  * Your HDL (Good Cholesterol) should be 40 or higher  * Your LDL (Bad Cholesterol) should be 100 or lower. * Your Triglycerides should be 150 or lower  * Your Urine microalbumin (kidney function) should be <30 * Your Body Mass Index should be 25 or lower    Please consider the following ways to cut down carbs and fat and increase fiber and micronutrients in your diet: - substitute whole grain for white bread or pasta - substitute brown rice for white rice - substitute 90-calorie flat bread pieces for slices of bread when possible - substitute sweet potatoes or yams for white potatoes - substitute humus for  margarine - substitute tofu for cheese when possible - substitute almond or rice milk for regular milk (would not drink soy milk daily due to concern for soy estrogen influence on breast cancer risk) - substitute dark chocolate for other sweets when possible - substitute water - can add lemon or orange slices for taste - for diet sodas (artificial sweeteners will trick your body that you can eat sweets without getting calories and will lead you to overeating and  weight gain in the long run) - do not skip breakfast or other meals (this will slow down the metabolism and will result in more weight gain over time)  - can try smoothies made from fruit and almond/rice milk in am instead of regular breakfast - can also try old-fashioned (not instant) oatmeal made with almond/rice milk in am - order the dressing on the side when eating salad at a restaurant (pour less than half of the dressing on the salad) - eat as little meat as possible - can try juicing, but should not forget that juicing will get rid of the fiber, so would alternate with eating raw veg./fruits or drinking smoothies - use as little oil as possible, even when using olive oil - can dress a salad with a mix of balsamic vinegar and lemon juice, for e.g. - use agave nectar, stevia sugar, or regular sugar rather than artificial sweateners - steam or broil/roast veggies  - snack on veggies/fruit/nuts (unsalted, preferably) when possible, rather than processed foods - reduce or eliminate aspartame in diet (it is in diet sodas, chewing gum, etc) Read the labels!  Try to read Dr. Janene Harvey book: "Program for Reversing Diabetes" for other ideas for healthy eating.

## 2022-05-02 NOTE — Progress Notes (Signed)
Patient ID: Rhonda Hudson, female   DOB: [birth year], 66 y.o.   MRN: [id number]  HPI: Rhonda Hudson is a 66 y.o.-year-old female, returning for follow-up for DM2, dx in 2019, non-insulin-dependent, controlled, without long-term complications. Pt. previously saw Rhonda Hudson, last visit 9 months ago.  Reviewed HbA1c: Lab Results  Component Value Date   HGBA1C 5.9 (A) 02/05/2022   HGBA1C 6.5 (A) 08/02/2021   HGBA1C 6.4 (A) 01/10/2021   HGBA1C 6.2 (A) 07/11/2020   HGBA1C 6.3 (A) 01/06/2020   HGBA1C 6.6 (H) 10/28/2019   HGBA1C 6.3 (A) 07/07/2019   HGBA1C 6.9 (A) 04/06/2019   HGBA1C 6.6 (H) 01/20/2019   HGBA1C 6.7 (H) 05/06/2018   Pt is on a regimen of: - Metformin ER 1000 mg daily in am (dose decreased 07/2021) - tolerated well   Pt does not check her sugars. - am: n/c - 2h after b'fast: n/c - before lunch: n/c - 2h after lunch: n/c - before dinner: n/c - 2h after dinner: n/c - bedtime: n/c - nighttime: n/c  Glucometer: none  Drinks 1 glass of milk and coca cola daily  - no CKD, last BUN/creatinine:  Lab Results  Component Value Date   BUN 7 03/08/2022   BUN 7 (L) 11/14/2021   CREATININE 0.87 03/08/2022   CREATININE 0.86 11/14/2021  She is not on an ACE inhibitor/ARB.  - + HL; last set of lipids: Lab Results  Component Value Date   CHOL 149 11/14/2021   HDL 45 11/14/2021   LDLCALC 77 11/14/2021   TRIG 158 (H) 11/14/2021   CHOLHDL 3.3 11/14/2021  She is on Lipitor 10 mg daily.  - last eye exam was in 2023. No DR reportedly.  - no numbness and tingling in her feet.  Last foot exam 01/10/2021.  Multiple thyroid nodules: - dx'ed in ~1980s - at that time, a cyst was drained per review of Rhonda Hudson note  Thyroid U/S (03/22/2019): Parenchymal Echotexture: Mildly heterogenous  Isthmus: 0.4 cm  Right lobe: 6.5 cm x 5.1 cm x 5.2 cm  Left lobe: 5.1 cm x 2.3 cm x 2.0 cm  _________________________________________________________   Estimated total number of  nodules >/= 1 cm: 3 _________________________________________________________   Right thyroid lesion is characterized based on recent CT imaging series, and not strictly TIRADS criteria. The lesion measures 5.5 cm x 4.0 cm x 4.7 cm within the mid right thyroid. Relatively heterogeneously hypoechoic internal tissue/fluid with a well-defined rim. No internal color flow. There is a focus centrally of slightly increased echogenicity, representing blood products and/or debris. The size is relatively unchanged from the CT dated 03/17/2019.   Nodule # 2:  Location: Left; Superior  Maximum size: 2.3 cm; Other 2 dimensions: 1.3 cm x 1.9 cm  Composition: solid/almost completely solid (2)  Echogenicity: hypoechoic (2) Echogenic foci: punctate echogenic foci (3)  ACR TI-RADS total points: 7. ACR TI-RADS risk category: TR5 (>/= 7 points). Nodule meets criteria for biopsy. _________________________________________________________   Nodule # 3:  Location: Left; Inferior  Maximum size: 1.3 cm; Other 2 dimensions: 0.6 cm x 1.0 cm  Composition: cannot determine (2)  Echogenicity: hypoechoic (2) Nodule meets criteria for surveillance.  _________________________________________________________   Nodule # 4:  Location: Left; Inferior  Maximum size: 1.2 cm; Other 2 dimensions: 1.0 cm x 0.5 cm  Composition: cystic/almost completely cystic (0) Cystic nodule does not meet criteria for surveillance or biopsy.  _________________________________________________________   No adenopathy   IMPRESSION: Complex cystic lesion of the right thyroid  measures 5.5 cm. Etiology may be inflammatory or infectious, less likely malignant. Percutaneous sampling may be considered.   Left upper thyroid nodule (labeled 2) meets criteria for biopsy, as designated by the newly established ACR TI-RADS criteria, and referral for biopsy is recommended.   Left lower thyroid nodule (labeled 3) meets criteria  for surveillance, as designated by the newly established ACR TI-RADS criteria. Surveillance ultrasound study recommended to be performed annually up to 5 years.  FNA (03/28/2022): Adequacy Reason Satisfactory For Evaluation. Diagnosis THYROID, FINE NEEDLE ASPIRATION, LUP (SPECIMEN 1 OF 3, COLLECTED 6/62/94): SCANT FOLLICULAR EPITHELIUM PRESENT (BETHESDA CATEGORY I). Specimen Clinical Information TR4 lup nodule Source Thyroid, Fine Needle Aspiration, LUP, (Specimen 1 of 3, collected on 03/29/19)  Adequacy Reason Satisfactory For Evaluation. Diagnosis PAROTID GLAND, FINE NEEDLE ASPIRATION, RIGHT (SPECIMEN 2 OF 3 COLLECTED 03-29-2019) FEW ATYPICAL CELLS PRESENT. SEE COMMENT. COMMENT: THERE ARE SCATTERED SMALL GROUPS OF ATYPICAL CELLS PRESENT WHICH APPEAR TO BE SQUAMOUS CELLS. THE DIFFERENTIAL DIAGNOSIS IS BROAD, BUT A LOW GRADE MALIGNANCY CAN NOT BE ENTIRELY RULED OUT. TISSUE STUDIES MAY HELP BETTER EVALUATE THE EXTENT AND SEVERITY OF THE ATYPICAL CELLS. Rhonda Hudson HAS REVIEWED THE CASE AND IS IN ESSENTIAL AGREEMENT WITH THIS INTERPRETATION. Rhonda Cutter MD Pathologist, Electronic Signature (Case signed 04/01/2019) Specimen Clinical Information Right parotid mass Final Cytologic Interpretation   Right parotid gland, Fine Needle Aspiration II (smears and cell  block):       Findings consistent with Warthin's tumor.    Adequacy Reason Satisfactory For Evaluation. Diagnosis THYROID, FINE NEEDLE ASPIRATION RIGHT CYST (SPECIMEN 3 OF 3 COLLECTED 03/29/2019) FINDINGS CONSISTENT WITH THE CONTENTS OF A CYST. Rhonda Cutter MD Pathologist, Electronic Signature (Case signed 03/30/2019) Specimen Clinical Information Enlarging cyst Source Thyroid, Fine Needle Aspiration, Right Cyst, (Specimen 3 of 3, collected on 03/29/19)  Thyroid U/S (07/24/2020): Parenchymal Echotexture: Moderately heterogenous  Isthmus: Normal in size measuring 0.5 cm in diameter, unchanged  Right lobe: Borderline  enlarged measuring 5.5 x 1.8 x 1.8 cm, previously, 6.5 x 5.1 x 5.2 cm  Left lobe: Enlarged measures 6.0 x 2.0 x 1.9 cm, previously, 5.1 x 2.3 x 2.0 cm.  _________________________________________________________   Estimated total number of nodules >/= 1 cm: 5 _________________________________________________________   The previously identified approximately 5.5 x 4.7 x 3.9 cm hypoechoic complex cyst replacing the majority of the right lobe of the thyroid has nearly completely resolved in the interval with residual apparent cyst measuring approximately 1.1 x 0.5 x 0.5 cm.   There are several additional scattered punctate (sub 0.8 cm) hypoechoic nodules within right lobe of the thyroid, none of which meet imaging criteria to recommend percutaneous sampling or continued dedicated follow-up  _________________________________________________________   Nodule # 5:  Location: Left; Superior - this nodule was not definitely seen on the 03/2019 examination  Maximum size: 1.6 cm; Other 2 dimensions: 1.2 x 0.8 cm  Composition: solid/almost completely solid (2)  Echogenicity: hypoechoic (2) **Given size (>/= 1.5 cm) and appearance, fine needle aspiration of this moderately suspicious nodule should be considered based on TI-RADS criteria.  _________________________________________________________   The previously biopsied 1.9 x 1.9 x 1.1 cm hypoechoic nodule within the mid aspect of the left lobe of the thyroid (labeled 6), is unchanged to decreased in size compared to the 03/2019 examination, previously, 2.3 x 1.9 x 1.3 cm. Correlation with previous biopsy results is advised.  _________________________________________________________   Nodule # 7:  Prior biopsy: No  Location: Left; Mid  Maximum size: 1.2 cm; Other 2 dimensions: 1.0  x 0.6 cm, previously, 1.3 x 1.0 x 0.6 cm  Composition: cystic/almost completely cystic (0) Change in features: Yes now appears anechoic and cystic, previously,  hypoechoic and indeterminate This nodule does NOT meet TI-RADS criteria for biopsy or dedicated follow-up.  _________________________________________________________   There is an approximately 1.0 x 0.9 x 0.8 cm minimally complex cyst within the inferior pole the left lobe thyroid labeled 8), not definitely seen on the 07/20 20 examination though does not meet criteria to recommend percutaneous sampling or continued dedicated follow-up.   IMPRESSION: 1. Interval development of an apparent new left-sided thyroid nodule (labeled #5) which meets imaging criteria to recommend percutaneous sampling as indicated. 2. Near complete resolution of previously aspirated right-sided thyroid cyst. 3. Previously biopsied left-sided thyroid nodule (labeled #6) is unchanged to decreased in size compared to the 03/2019 examination. Correlation with previous biopsy results is advised. Assuming a benign pathologic diagnosis, repeat sampling and/or continued dedicated follow-up is not recommended. 4. Additional left-sided thyroid nodule (labeled #7), previously meeting criteria to recommend surveillance, now appears anechoic and cystic and as such has been down graded from a TR4 nodule to a TR1 nodule and no longer meets imaging criteria to recommend continued dedicated follow-up.  FNA (08/03/2020): Clinical History: Left; Superior - this nodule was not definitely seen  on the 03/2019 examination, 1.6 cm;  Other 2 dimensions: 1.2 x 0.8cm  solid/almost completely solid hypoechoic TI-RADS - 4  Specimen Submitted:  A. THYROID, LUP, FINE NEEDLE ASPIRATION:   FINAL MICROSCOPIC DIAGNOSIS:  - Consistent with benign follicular nodule (Bethesda category II)   SPECIMEN ADEQUACY:  Satisfactory for evaluation   Thyroid U/S (08/22/2021): Parenchymal Echotexture: Mildly heterogenous  Isthmus: 0.5 cm  Right lobe: 5.1 x 1.7 x 1.6 cm  Left lobe: 5.0 x 2.2 x 2.0 cm   _________________________________________________________   Estimated total number of nodules >/= 1 cm: 4 _________________________________________________________   Nodule # 5: The previously biopsied nodule in the left superior gland remains stable to slightly smaller at 1.4 x 0.8 x 0.8 cm compared to 1.6 x 1.2 x 0.8 cm previously.   Nodule # 6: Previously biopsied nodule in the left mid to upper gland is similar in size at 2.1 x 1.8 x 1.3 cm compared to 1.9 x 1.9 x 1.1 cm previously.   Numerous additional small cysts and nodules scattered throughout the thyroid gland. None of these meet criteria to warrant further evaluation or biopsy.   IMPRESSION: 1. Slow involution of previously biopsied nodule in the left superior gland. Involution over time is consistent with benignity. 2. Continued stability of previously biopsied nodule in the left mid gland. Recommend correlation with prior biopsy results. 3. Numerous additional small cysts and nodules bilaterally, none of which meet criteria to warrant further evaluation. 4. No new or suspicious nodules.   R lobe:   Sup. L lobe:   Pt denies: - feeling nodules in neck - hoarseness - dysphagia She has choking with meat occasionally.  Patient also has a history of hyperthyroidism, but latest TFTs have been normal: Lab Results  Component Value Date   TSH 0.678 11/14/2021   TSH 1.15 07/11/2020   TSH 0.846 01/20/2019   TSH 0.709 05/06/2018   TSH 0.46 01/04/2016   FREET4 1.32 11/14/2021   FREET4 0.84 07/11/2020   FREET4 1.26 01/20/2019   FREET4 1.17 05/06/2018   + FH of thyroid ds. In son; no thyroid cancer. No h/o radiation tx to head or neck. No recent contrast studies. No herbal  supplements. No Biotin use. No recent steroids use.   ROS: + see HPI No increased urination, blurry vision, nausea, chest pain.  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   Panic attacks    Hx of   Thickened endometrium    Thyroid nodule 03/03/2019   Past Surgical History:  Procedure Laterality Date   CHOLECYSTECTOMY     CYSTOSCOPY  11/04/2019   Procedure: CYSTOSCOPY;  Surgeon: Azucena Fallen, MD;  Location: [facility];  Service: Gynecology;;   DILATION AND CURETTAGE OF UTERUS N/A 06/16/2018   Procedure: DILATATION AND CURETTAGE;  Surgeon: Emily Filbert, MD;  Location: [place];  Service: Gynecology;  Laterality: N/A;   ROBOTIC ASSISTED TOTAL HYSTERECTOMY WITH BILATERAL SALPINGO OOPHERECTOMY Bilateral 11/04/2019   Procedure: XI ROBOTIC ASSISTED TOTAL HYSTERECTOMY WITH BILATERAL SALPINGO OOPHORECTOMY/Pelvic Washings;  Surgeon: Azucena Fallen, MD;  Location: [facility];  Service: Gynecology;  Laterality: Bilateral;  Tracie to RNFA confirmed on 10/15/19 CS   TOE SURGERY     TUBAL LIGATION     US ECHOCARDIOGRAPHY  01-31-2009   EF 60%   Social History   Socioeconomic History   Marital status: Divorced    Spouse name: Not on file   Number of children: 2   Years of education: 12   Highest education level: 12th grade  Occupational History   Occupation: Social Security Disability  Tobacco Use   Smoking status: Every Day    Packs/day: 0.50    Years: 40.00    Total pack years: 20.00    Types: Cigarettes    Start date: 1980   Smokeless tobacco: Never  Vaping Use   Vaping Use: Never used  Substance and Sexual Activity   Alcohol use: No    Alcohol/week:  0.0 standard drinks of alcohol   Drug use: No   Sexual activity: Not Currently    Birth control/protection: Post-menopausal  Other Topics Concern   Not on file  Social History Narrative   Lives with on of her 3 sons, no grandchildren   Caffeine use: coffee and coke daily   Right-handed   Retired   Exercise - walks, mows, chops her wood   11/2021   Social Determinants of Health   Financial Resource Strain: High Risk (07/11/2020)   Overall Financial Resource Strain (CARDIA)    Difficulty of Paying Living Expenses: Hard  Food Insecurity: Food Insecurity Present (07/11/2020)   Hunger Vital Sign    Worried About Running Out of Food in the Last Year: Never true    Ran Out of Food in the Last Year: Often true  Transportation Needs: No Transportation Needs (07/11/2020)   PRAPARE - Hydrologist (Medical): No    Lack of Transportation (Non-Medical): No  Physical Activity: Inactive (07/11/2020)   Exercise Vital Sign    Days of Exercise per Week:  0 days    Minutes of Exercise per Session: 0 min  Stress: Stress Concern Present (07/11/2020)   [place]    Feeling of Stress : Very much  Social Connections: Moderately Isolated (07/11/2020)   Social Connection and Isolation Panel [NHANES]    Frequency of Communication with Friends and Family: More than three times a week    Frequency of Social Gatherings with Friends and Family: More than three times a week    Attends Religious Services: More than 4 times per year    Active Member of [employer] or Organizations: No    Attends Archivist Meetings: Never    Marital Status: Divorced  Human resources officer Violence: Not At Risk (07/11/2020)   Humiliation, Afraid, Rape, and Kick questionnaire    Fear of Current or Ex-Partner: No    Emotionally Abused: No    Physically Abused: No    Sexually Abused: No   Current Outpatient Medications on File Prior  to Visit  Medication Sig Dispense Refill   atorvastatin (LIPITOR) 10 MG tablet TAKE 1 TABLET EVERY DAY 90 tablet 1   diphenhydrAMINE-APAP, sleep, (ACETAMINOPHEN PM PO) Take by mouth.     metFORMIN (GLUCOPHAGE-XR) 500 MG 24 hr tablet Take 2 tablets (1,000 mg total) by mouth daily. 180 tablet 3   XARELTO 10 MG TABS tablet TAKE 1 TABLET (10 MG TOTAL) BY MOUTH DAILY. 90 tablet 0   No current facility-administered medications on file prior to visit.   No Known Allergies Family History  Problem Relation Age of Onset   Diabetes Mother    Leukemia Father    Leukemia Sister    Diabetes Sister    Leukemia Brother    Multiple sclerosis Neg Hx    Autoimmune disease Neg Hx    Thyroid disease Neg Hx    PE: There were no vitals taken for this visit. Wt Readings from Last 3 Encounters:  03/22/22 205 lb (93 kg)  03/08/22 205 lb 3.2 oz (93.1 kg)  11/14/21 204 lb (92.5 kg)   Constitutional: overweight, in NAD Eyes: no exophthalmos ENT: moist mucous membranes, no thyromegaly, no cervical lymphadenopathy Cardiovascular: RRR, No MRG Respiratory: CTA B Musculoskeletal: no deformities Skin: moist, warm, no rashes Neurological: no tremor with outstretched hands Diabetic Foot Exam - Simple   Simple Foot Form Diabetic Foot exam was performed with the following findings: Yes 05/02/2022 11:30 AM  Visual Inspection No deformities, no ulcerations, no other skin breakdown bilaterally: Yes Sensation Testing Intact to touch and monofilament testing bilaterally: Yes Pulse Check Posterior Tibialis and Dorsalis pulse intact bilaterally: Yes Comments    ASSESSMENT: 1. DM2, non-insulin-dependent, controlled, without long-term complications  2.  Multiple thyroid nodules  3.  History of hypothyroidism  PLAN:  1. Patient with long-standing, controlled diabetes, on oral antidiabetic regimen, with excellent control at last OV with Rhonda Hudson - HbA1c 5.9%. Today, HbA1c is slightly higher: 6.3%. -At  today's visit, she is not checking blood sugars.  We discussed about the importance of doing so, but she is very reticent to start.  She does not have a meter.  We sent a prescription for a One Touch Verio meter and supplies to her pharmacy. -For now, since her HbA1c is at goal, and she tolerates metformin well at the half maximal dose, we will not change her regimen.  I refilled her metformin prescription. - I suggested to:  Patient Instructions  Please continue: - Metformin ER 1000 mg in  am  Start checking blood sugars 1x a day or at least every other day.  Please return in 6 months with your sugar log.   - I strongly advised her to check sugars at different times of the day - check 1x a day, rotating checks - discussed about CBG targets for treatment: 80-130 mg/dL before meals and <180 mg/dL after meals; target HbA1c <7%. - given foot care handout  - given instructions for hypoglycemia management "15-15 rule"  - advised for yearly eye exams  -she is up-to-date - Return to clinic in 6 mo with sugar log   2.  Multiple thyroid nodules - Reviewed previous ultrasound reports, with the latest being on 08/22/2021.The dominant nodules in the left upper lobe were both biopsied with benign results.  They appear slightly smaller or stable on the latest ultrasound.  The rest of the nodules did not meet criteria for follow-up or Bx - no neck compression symptoms except for occasional choking with dry foods, which is not new - will continue to follow her expectantly  3.  History of hyperthyroidism -Resolved -Latest TFTs reviewed from 11/2021 and these were normal. -We will repeat these at next visit  - Total time spent for the visit: 40 min, in precharting, reviewing Rhonda Hudson last note, obtaining medical information from the chart and from the pt, reviewing her  previous labs, imaging results, evaluations, and treatments, reviewing her symptoms, counseling her about her diabetes and thyroid  conditions (please see the discussed topics above), and developing a plan to further investigate and treat them.  Philemon Kingdom, MD PhD [facility] Endocrinology

## 2022-05-13 ENCOUNTER — Telehealth: Payer: Self-pay | Admitting: Pharmacy Technician

## 2022-05-13 ENCOUNTER — Other Ambulatory Visit (HOSPITAL_COMMUNITY): Payer: Self-pay

## 2022-05-13 DIAGNOSIS — E1165 Type 2 diabetes mellitus with hyperglycemia: Secondary | ICD-10-CM

## 2022-05-13 NOTE — Telephone Encounter (Signed)
Patient Advocate Encounter   Received notification from CoverMyMeds that prior authorization for One Touch Verio is required/requested.  Per Test Claim: Ins PREFER ACCU-CHEK OR TRUE METRIX

## 2022-05-15 MED ORDER — ACCU-CHEK SOFTCLIX LANCETS MISC: 3 refills | Status: DC

## 2022-05-15 MED ORDER — ACCU-CHEK GUIDE VI STRP: ORAL_STRIP | 3 refills | Status: DC

## 2022-05-15 MED ORDER — ACCU-CHEK GUIDE W/DEVICE KIT: PACK | 0 refills | Status: AC

## 2022-05-15 NOTE — Telephone Encounter (Signed)
Rx sent for Accu-Chek meter and supplies.

## 2022-05-15 NOTE — Addendum Note (Signed)
Addended by: Lauralyn Primes on: 05/15/2022 01:42 PM   Modules accepted: Orders

## 2022-05-22 ENCOUNTER — Ambulatory Visit (INDEPENDENT_AMBULATORY_CARE_PROVIDER_SITE_OTHER): Payer: Medicare HMO | Admitting: Medical

## 2022-05-22 VITALS — BP 122/82 | HR 93 | Temp 99.2°F | Wt 192.0 lb

## 2022-05-22 DIAGNOSIS — J988 Other specified respiratory disorders: Secondary | ICD-10-CM | POA: Diagnosis not present

## 2022-05-22 DIAGNOSIS — R059 Cough, unspecified: Secondary | ICD-10-CM

## 2022-05-22 LAB — POC COVID19 BINAXNOW: SARS Coronavirus 2 Ag: NEGATIVE

## 2022-05-22 NOTE — Addendum Note (Signed)
Addended by: Minette Headland A on: 05/22/2022 05:06 PM   Modules accepted: Orders

## 2022-05-22 NOTE — Progress Notes (Signed)
Subjective:  Rhonda Hudson is a 66 y.o. female who presents for Chief Complaint  Patient presents with   Nasal Congestion    Nasal congestion- cough, Headache, stuffy nose, scratching throat, x 5 days. No covid testing     Here for illness.  She notes 5 days of headache, scratchy throat, lots of cough, mild fever.  No ear pain.  No nausea, vomiting, no diarrhea.  No SOB or wheezing.  No body ache or chills.   No sick contacts.   Using nothing for symptoms except some cough drops.   She is a smoker.  No other aggravating or relieving factors.    No other c/o.  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 180 tablet 3   XARELTO 10 MG TABS tablet TAKE 1 TABLET (10 MG TOTAL) BY MOUTH DAILY. 90 tablet 0   Accu-Chek Softclix Lancets lancets Use as instructed to check blood sugar 1X daily 100 each 3   Blood Glucose Monitoring Suppl (ACCU-CHEK GUIDE) w/Device KIT Use as instructed to check blood sugar 1X daily 1 kit 0   diphenhydrAMINE-APAP, sleep, (ACETAMINOPHEN PM PO) Take by mouth.     glucose blood (ACCU-CHEK GUIDE) test strip Use as  instructed to check blood sugar 1X daily 100 each 3   No current facility-administered medications on file prior to visit.     The following portions of the patient's history were reviewed and updated as appropriate: allergies, current medications, past family history, past medical history, past social history, past surgical history and problem list.  ROS Otherwise as in subjective above  Objective: BP 122/82   Pulse 93   Temp 99.2 F (37.3 C)   Wt 192 lb (87.1 kg)   SpO2 97%   BMI 29.19 kg/m   Wt Readings from Last 3 Encounters:  05/22/22 192 lb (87.1 kg)  05/02/22 202 lb 12.8 oz (92 kg)  03/22/22 205 lb (93 kg)   General appearance: alert, no distress, well developed, well nourished HEENT: normocephalic, sclerae anicteric, conjunctiva pink and moist, TMs pearly, nares patent, no discharge or erythema, pharynx normal Oral cavity: MMM, no lesions Neck: supple, no lymphadenopathy, no thyromegaly, no masses Heart: RRR, normal S1, S2, no murmurs Lungs: CTA bilaterally, no wheezes, rhonchi, or rales Pulses: 2+ radial pulses, 2+ pedal pulses, normal cap refill Ext: no edema   Assessment: Encounter Diagnoses  Name Primary?   Cough, unspecified type Yes   Respiratory tract infection      Plan: Symptoms and  exam suggest viral URI.  Advised supportive measures, rest, hydration, tylenol Robitussin or Tussin DM OTC.  Symptoms should gradually improve in the next few days.  If worse or not improving over the next few days call or recheck  Rhonda Hudson was seen today for nasal congestion.  Diagnoses and all orders for this visit:  Cough, unspecified type  Respiratory tract infection    Follow up: prn

## 2022-05-24 ENCOUNTER — Telehealth: Payer: Self-pay | Admitting: Medical

## 2022-05-24 ENCOUNTER — Other Ambulatory Visit: Payer: Self-pay | Admitting: Medical

## 2022-05-24 MED ORDER — AZITHROMYCIN 500 MG PO TABS: ORAL_TABLET | ORAL | 0 refills | Status: DC

## 2022-05-24 MED ORDER — PROMETHAZINE-DM 6.25-15 MG/5ML PO SYRP: 5.0000 mL | ORAL_SOLUTION | Freq: Four times a day (QID) | ORAL | 0 refills | Status: DC | PRN

## 2022-05-24 NOTE — Telephone Encounter (Signed)
Pt called back and states she will just go pick up the meds from [facility], dont change pharmacy.

## 2022-05-24 NOTE — Telephone Encounter (Signed)
Pt called and states that she feels worse than the other day coughing and it is keeping her up at night, has the stuffy nose, her throat is scatcy from coughing so much She wants to know if you will call her something in  Pt uses [facility] [address], [place], [place] [address]

## 2022-06-11 ENCOUNTER — Other Ambulatory Visit (INDEPENDENT_AMBULATORY_CARE_PROVIDER_SITE_OTHER): Payer: Medicare HMO

## 2022-06-11 DIAGNOSIS — Z23 Encounter for immunization: Secondary | ICD-10-CM | POA: Diagnosis not present

## 2022-06-29 ENCOUNTER — Other Ambulatory Visit: Payer: Self-pay | Admitting: Medical

## 2022-07-23 DIAGNOSIS — L298 Other pruritus: Secondary | ICD-10-CM | POA: Diagnosis not present

## 2022-07-23 DIAGNOSIS — L570 Actinic keratosis: Secondary | ICD-10-CM | POA: Diagnosis not present

## 2022-07-23 DIAGNOSIS — L821 Other seborrheic keratosis: Secondary | ICD-10-CM | POA: Diagnosis not present

## 2022-07-23 DIAGNOSIS — D492 Neoplasm of unspecified behavior of bone, soft tissue, and skin: Secondary | ICD-10-CM | POA: Diagnosis not present

## 2022-08-12 ENCOUNTER — Other Ambulatory Visit: Payer: Self-pay | Admitting: Medical

## 2022-08-12 DIAGNOSIS — E7800 Pure hypercholesterolemia, unspecified: Secondary | ICD-10-CM

## 2022-10-04 ENCOUNTER — Telehealth: Payer: Self-pay | Admitting: Medical

## 2022-10-04 NOTE — Telephone Encounter (Signed)
Pt dropped off handicap placard form to be filled out

## 2022-11-05 ENCOUNTER — Encounter: Payer: Self-pay | Admitting: Internal Medicine

## 2022-11-05 ENCOUNTER — Ambulatory Visit: Payer: Medicare HMO | Admitting: Internal Medicine

## 2022-11-05 VITALS — BP 120/80 | HR 75 | Ht 68.0 in

## 2022-11-05 DIAGNOSIS — E1165 Type 2 diabetes mellitus with hyperglycemia: Secondary | ICD-10-CM

## 2022-11-05 DIAGNOSIS — E042 Nontoxic multinodular goiter: Secondary | ICD-10-CM | POA: Diagnosis not present

## 2022-11-05 DIAGNOSIS — Z8639 Personal history of other endocrine, nutritional and metabolic disease: Secondary | ICD-10-CM

## 2022-11-05 LAB — POCT GLYCOSYLATED HEMOGLOBIN (HGB A1C): Hemoglobin A1C: 6.1 % — AB (ref 4.0–5.6)

## 2022-11-05 LAB — TSH: TSH: 1.08 u[IU]/mL (ref 0.35–5.50)

## 2022-11-05 NOTE — Patient Instructions (Addendum)
Please stop Metformin ER.  Check blood sugars 1x a day or at least every other day.  Let me know if the sugars increase.  Please return in 4-6 months with your sugar log.

## 2022-11-05 NOTE — Progress Notes (Signed)
Patient ID: Rhonda Hudson, female   DOB: 1956-01-27, 67 y.o.   MRN: [id number]  HPI: Rhonda Hudson is a 67 y.o.-year-old female, returning for follow-up for DM2, dx in 2019, non-insulin-dependent, controlled, without long-term complications. Pt. previously saw Dr. Loanne Drilling, but last visit with me 6 months ago.  Interim history: No increased urination, blurry vision, nausea, chest pain.  She lost 16 lbs - diarrhea after every meal. She can barely leave the house.  Reviewed HbA1c: Lab Results  Component Value Date   HGBA1C 6.3 (A) 05/02/2022   HGBA1C 5.9 (A) 02/05/2022   HGBA1C 6.5 (A) 08/02/2021   HGBA1C 6.4 (A) 01/10/2021   HGBA1C 6.2 (A) 07/11/2020   HGBA1C 6.3 (A) 01/06/2020   HGBA1C 6.6 (H) 10/28/2019   HGBA1C 6.3 (A) 07/07/2019   HGBA1C 6.9 (A) 04/06/2019   HGBA1C 6.6 (H) 01/20/2019   Pt is on a regimen of: - Metformin ER 1000 mg daily in am (dose decreased 07/2021)   Pt does check her sugars - 0-1x a day: - am: n/c - 2h after b'fast: n/c - before lunch: n/c >> 124, 134 - 2h after lunch: n/c >> 105-145, 166 - before dinner: n/c - 2h after dinner: n/c - bedtime: n/c - nighttime: n/c  Glucometer: AccuChek Guide  - no CKD, last BUN/creatinine:  Lab Results  Component Value Date   BUN 7 03/08/2022   BUN 7 (L) 11/14/2021   CREATININE 0.87 03/08/2022   CREATININE 0.86 11/14/2021  She is not on an ACE inhibitor/ARB.  - + HL; last set of lipids: Lab Results  Component Value Date   CHOL 149 11/14/2021   HDL 45 11/14/2021   LDLCALC 77 11/14/2021   TRIG 158 (H) 11/14/2021   CHOLHDL 3.3 11/14/2021  She is on Lipitor 10 mg daily.  - last eye exam was in 2023. No DR reportedly.  - no numbness and tingling in her feet.  Last foot exam 05/02/2022.  Multiple thyroid nodules: - dx'ed in ~1980s - at that time, a cyst was drained per review of Dr. Cordelia Pen note  Thyroid U/S (03/22/2019): Parenchymal Echotexture: Mildly heterogenous  Isthmus: 0.4 cm  Right lobe:  6.5 cm x 5.1 cm x 5.2 cm  Left lobe: 5.1 cm x 2.3 cm x 2.0 cm  _________________________________________________________   Estimated total number of nodules >/= 1 cm: 3 _________________________________________________________   Right thyroid lesion is characterized based on recent CT imaging series, and not strictly TIRADS criteria. The lesion measures 5.5 cm x 4.0 cm x 4.7 cm within the mid right thyroid. Relatively heterogeneously hypoechoic internal tissue/fluid with a well-defined rim. No internal color flow. There is a focus centrally of slightly increased echogenicity, representing blood products and/or debris. The size is relatively unchanged from the CT dated 03/17/2019.   Nodule # 2:  Location: Left; Superior  Maximum size: 2.3 cm; Other 2 dimensions: 1.3 cm x 1.9 cm  Composition: solid/almost completely solid (2)  Echogenicity: hypoechoic (2) Echogenic foci: punctate echogenic foci (3)  ACR TI-RADS total points: 7. ACR TI-RADS risk category: TR5 (>/= 7 points). Nodule meets criteria for biopsy. _________________________________________________________   Nodule # 3:  Location: Left; Inferior  Maximum size: 1.3 cm; Other 2 dimensions: 0.6 cm x 1.0 cm  Composition: cannot determine (2)  Echogenicity: hypoechoic (2) Nodule meets criteria for surveillance.  _________________________________________________________   Nodule # 4:  Location: Left; Inferior  Maximum size: 1.2 cm; Other 2 dimensions: 1.0 cm x 0.5 cm  Composition: cystic/almost completely cystic (  0) Cystic nodule does not meet criteria for surveillance or biopsy.  _________________________________________________________   No adenopathy   IMPRESSION: Complex cystic lesion of the right thyroid measures 5.5 cm. Etiology may be inflammatory or infectious, less likely malignant. Percutaneous sampling may be considered.   Left upper thyroid nodule (labeled 2) meets criteria for biopsy, as designated by the  newly established ACR TI-RADS criteria, and referral for biopsy is recommended.   Left lower thyroid nodule (labeled 3) meets criteria for surveillance, as designated by the newly established ACR TI-RADS criteria. Surveillance ultrasound study recommended to be performed annually up to 5 years.  FNA (03/28/2022): Adequacy Reason Satisfactory For Evaluation. Diagnosis THYROID, FINE NEEDLE ASPIRATION, LUP (SPECIMEN 1 OF 3, COLLECTED [id number]): SCANT FOLLICULAR EPITHELIUM PRESENT (BETHESDA CATEGORY I). Specimen Clinical Information TR4 lup nodule Source Thyroid, Fine Needle Aspiration, LUP, (Specimen 1 of 3, collected on 03/29/19)  Adequacy Reason Satisfactory For Evaluation. Diagnosis PAROTID GLAND, FINE NEEDLE ASPIRATION, RIGHT (SPECIMEN 2 OF 3 COLLECTED 03-29-2019) FEW ATYPICAL CELLS PRESENT. SEE COMMENT. COMMENT: THERE ARE SCATTERED SMALL GROUPS OF ATYPICAL CELLS PRESENT WHICH APPEAR TO BE SQUAMOUS CELLS. THE DIFFERENTIAL DIAGNOSIS IS BROAD, BUT A LOW GRADE MALIGNANCY CAN NOT BE ENTIRELY RULED OUT. TISSUE STUDIES MAY HELP BETTER EVALUATE THE EXTENT AND SEVERITY OF THE ATYPICAL CELLS. DR. CANACCI HAS REVIEWED THE CASE AND IS IN ESSENTIAL AGREEMENT WITH THIS INTERPRETATION. Enid Cutter MD Pathologist, Electronic Signature (Case signed 04/01/2019) Specimen Clinical Information Right parotid mass Final Cytologic Interpretation   Right parotid gland, Fine Needle Aspiration II (smears and cell  block):       Findings consistent with Warthin's tumor.    Adequacy Reason Satisfactory For Evaluation. Diagnosis THYROID, FINE NEEDLE ASPIRATION RIGHT CYST (SPECIMEN 3 OF 3 COLLECTED 03/29/2019) FINDINGS CONSISTENT WITH THE CONTENTS OF A CYST. Enid Cutter MD Pathologist, Electronic Signature (Case signed 03/30/2019) Specimen Clinical Information Enlarging cyst Source Thyroid, Fine Needle Aspiration, Right Cyst, (Specimen 3 of 3, collected on 03/29/19)  Thyroid U/S  (07/24/2020): Parenchymal Echotexture: Moderately heterogenous  Isthmus: Normal in size measuring 0.5 cm in diameter, unchanged  Right lobe: Borderline enlarged measuring 5.5 x 1.8 x 1.8 cm, previously, 6.5 x 5.1 x 5.2 cm  Left lobe: Enlarged measures 6.0 x 2.0 x 1.9 cm, previously, 5.1 x 2.3 x 2.0 cm.  _________________________________________________________   Estimated total number of nodules >/= 1 cm: 5 _________________________________________________________   The previously identified approximately 5.5 x 4.7 x 3.9 cm hypoechoic complex cyst replacing the majority of the right lobe of the thyroid has nearly completely resolved in the interval with residual apparent cyst measuring approximately 1.1 x 0.5 x 0.5 cm.   There are several additional scattered punctate (sub 0.8 cm) hypoechoic nodules within right lobe of the thyroid, none of which meet imaging criteria to recommend percutaneous sampling or continued dedicated follow-up  _________________________________________________________   Nodule # 5:  Location: Left; Superior - this nodule was not definitely seen on the 03/2019 examination  Maximum size: 1.6 cm; Other 2 dimensions: 1.2 x 0.8 cm  Composition: solid/almost completely solid (2)  Echogenicity: hypoechoic (2) **Given size (>/= 1.5 cm) and appearance, fine needle aspiration of this moderately suspicious nodule should be considered based on TI-RADS criteria.  _________________________________________________________   The previously biopsied 1.9 x 1.9 x 1.1 cm hypoechoic nodule within the mid aspect of the left lobe of the thyroid (labeled 6), is unchanged to decreased in size compared to the 03/2019 examination, previously, 2.3 x 1.9 x 1.3 cm. Correlation with previous biopsy  results is advised.  _________________________________________________________   Nodule # 7:  Prior biopsy: No  Location: Left; Mid  Maximum size: 1.2 cm; Other 2 dimensions: 1.0 x 0.6  cm, previously, 1.3 x 1.0 x 0.6 cm  Composition: cystic/almost completely cystic (0) Change in features: Yes now appears anechoic and cystic, previously, hypoechoic and indeterminate This nodule does NOT meet TI-RADS criteria for biopsy or dedicated follow-up.  _________________________________________________________   There is an approximately 1.0 x 0.9 x 0.8 cm minimally complex cyst within the inferior pole the left lobe thyroid labeled 8), not definitely seen on the 07/20 20 examination though does not meet criteria to recommend percutaneous sampling or continued dedicated follow-up.   IMPRESSION: 1. Interval development of an apparent new left-sided thyroid nodule (labeled #5) which meets imaging criteria to recommend percutaneous sampling as indicated. 2. Near complete resolution of previously aspirated right-sided thyroid cyst. 3. Previously biopsied left-sided thyroid nodule (labeled #6) is unchanged to decreased in size compared to the 03/2019 examination. Correlation with previous biopsy results is advised. Assuming a benign pathologic diagnosis, repeat sampling and/or continued dedicated follow-up is not recommended. 4. Additional left-sided thyroid nodule (labeled #7), previously meeting criteria to recommend surveillance, now appears anechoic and cystic and as such has been down graded from a TR4 nodule to a TR1 nodule and no longer meets imaging criteria to recommend continued dedicated follow-up.  FNA (08/03/2020): Clinical History: Left; Superior - this nodule was not definitely seen  on the 03/2019 examination, 1.6 cm;  Other 2 dimensions: 1.2 x 0.8cm  solid/almost completely solid hypoechoic TI-RADS - 4  Specimen Submitted:  A. THYROID, LUP, FINE NEEDLE ASPIRATION:   FINAL MICROSCOPIC DIAGNOSIS:  - Consistent with benign follicular nodule (Bethesda category II)   SPECIMEN ADEQUACY:  Satisfactory for evaluation   Thyroid U/S (08/22/2021): Parenchymal  Echotexture: Mildly heterogenous  Isthmus: 0.5 cm  Right lobe: 5.1 x 1.7 x 1.6 cm  Left lobe: 5.0 x 2.2 x 2.0 cm  _________________________________________________________   Estimated total number of nodules >/= 1 cm: 4 _________________________________________________________   Nodule # 5: The previously biopsied nodule in the left superior gland remains stable to slightly smaller at 1.4 x 0.8 x 0.8 cm compared to 1.6 x 1.2 x 0.8 cm previously.   Nodule # 6: Previously biopsied nodule in the left mid to upper gland is similar in size at 2.1 x 1.8 x 1.3 cm compared to 1.9 x 1.9 x 1.1 cm previously.   Numerous additional small cysts and nodules scattered throughout the thyroid gland. None of these meet criteria to warrant further evaluation or biopsy.   IMPRESSION: 1. Slow involution of previously biopsied nodule in the left superior gland. Involution over time is consistent with benignity. 2. Continued stability of previously biopsied nodule in the left mid gland. Recommend correlation with prior biopsy results. 3. Numerous additional small cysts and nodules bilaterally, none of which meet criteria to warrant further evaluation. 4. No new or suspicious nodules.   R lobe:   Sup. L lobe:   Pt denies: - feeling nodules in neck - hoarseness - dysphagia She has choking with meat occasionally.  Patient also has a history of hyperthyroidism, but latest TFTs have been normal: Lab Results  Component Value Date   TSH 0.678 11/14/2021   TSH 1.15 07/11/2020   TSH 0.846 01/20/2019   TSH 0.709 05/06/2018   TSH 0.46 01/04/2016   FREET4 1.32 11/14/2021   FREET4 0.84 07/11/2020   FREET4 1.26 01/20/2019   FREET4 1.17  05/06/2018   + FH of thyroid ds. In son; no thyroid cancer. No h/o radiation tx to head or neck. No recent contrast studies. No herbal supplements. No Biotin use. No recent steroids use.   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   Panic attacks    Hx of   Thickened endometrium    Thyroid nodule 03/03/2019   Past Surgical History:  Procedure Laterality Date   CHOLECYSTECTOMY     CYSTOSCOPY  11/04/2019   Procedure: CYSTOSCOPY;  Surgeon: Azucena Fallen, MD;  Location: [facility];  Service: Gynecology;;   DILATION AND CURETTAGE OF UTERUS N/A 06/16/2018   Procedure: DILATATION AND CURETTAGE;  Surgeon: Emily Filbert, MD;  Location: [place];  Service: Gynecology;  Laterality: N/A;   ROBOTIC ASSISTED TOTAL HYSTERECTOMY WITH BILATERAL SALPINGO OOPHERECTOMY Bilateral 11/04/2019   Procedure: XI ROBOTIC ASSISTED TOTAL HYSTERECTOMY WITH BILATERAL SALPINGO OOPHORECTOMY/Pelvic Washings;  Surgeon: Azucena Fallen, MD;  Location: [facility];  Service: Gynecology;  Laterality: Bilateral;  Tracie to RNFA confirmed on 10/15/19 CS   TOE SURGERY     TUBAL LIGATION     US ECHOCARDIOGRAPHY  01-31-2009   EF 60%   Social History   Socioeconomic History   Marital status: Divorced    Spouse name: Not on file   Number of children: 2   Years of education: 12   Highest education level: 12th grade  Occupational History   Occupation: Social Security Disability  Tobacco Use   Smoking status: Every Day    Packs/day: 0.50    Years: 40.00    Total pack years: 20.00    Types: Cigarettes    Start date: 1980   Smokeless tobacco: Never  Vaping Use   Vaping Use: Never used  Substance  and Sexual Activity   Alcohol use: No    Alcohol/week: 0.0 standard drinks of alcohol   Drug use: No   Sexual activity: Not Currently    Birth control/protection: Post-menopausal  Other Topics Concern   Not on file  Social History Narrative   Lives with on of her 3 sons, no grandchildren   Caffeine use: coffee and coke daily   Right-handed   Retired   Exercise - walks, mows, chops her wood   11/2021   Social Determinants of Health   Financial Resource Strain: High Risk (07/11/2020)   Overall Financial Resource Strain (CARDIA)    Difficulty of Paying Living Expenses: Hard  Food Insecurity: Food Insecurity Present (07/11/2020)   Hunger Vital Sign    Worried About Running Out of Food in the Last Year: Never true    Ran Out of Food in the Last Year: Often true  Transportation Needs: No Transportation Needs (07/11/2020)   PRAPARE - Hydrologist (Medical): No    Lack of Transportation (Non-Medical):  No  Physical Activity: Inactive (07/11/2020)   Exercise Vital Sign    Days of Exercise per Week: 0 days    Minutes of Exercise per Session: 0 min  Stress: Stress Concern Present (07/11/2020)   [place]    Feeling of Stress : Very much  Social Connections: Moderately Isolated (07/11/2020)   Social Connection and Isolation Panel [NHANES]    Frequency of Communication with Friends and Family: More than three times a week    Frequency of Social Gatherings with Friends and Family: More than three times a week    Attends Religious Services: More than 4 times per year    Active Member of [employer] or Organizations: No    Attends Archivist Meetings: Never    Marital Status: Divorced  Human resources officer Violence: Not At Risk (07/11/2020)   Humiliation, Afraid, Rape, and Kick questionnaire    Fear of Current or Ex-Partner: No    Emotionally Abused: No    Physically Abused: No    Sexually  Abused: No   Current Outpatient Medications on File Prior to Visit  Medication Sig Dispense Refill   Accu-Chek Softclix Lancets lancets Use as instructed to check blood sugar 1X daily 100 each 3   atorvastatin (LIPITOR) 10 MG tablet TAKE 1 TABLET EVERY DAY 90 tablet 1   azithromycin (ZITHROMAX) 500 MG tablet 1 tablet po daily x 3 days 3 tablet 0   Blood Glucose Monitoring Suppl (ACCU-CHEK GUIDE) w/Device KIT Use as instructed to check blood sugar 1X daily 1 kit 0   diphenhydrAMINE-APAP, sleep, (ACETAMINOPHEN PM PO) Take by mouth.     glucose blood (ACCU-CHEK GUIDE) test strip Use as instructed to check blood sugar 1X daily 100 each 3   metFORMIN (GLUCOPHAGE-XR) 500 MG 24 hr tablet Take 2 tablets (1,000 mg total) by mouth daily. 180 tablet 3   promethazine-dextromethorphan (PROMETHAZINE-DM) 6.25-15 MG/5ML syrup Take 5 mLs by mouth 4 (four) times daily as needed for cough. 120 mL 0   XARELTO 10 MG TABS tablet TAKE 1 TABLET EVERY DAY 90 tablet 1   No current facility-administered medications on file prior to visit.   No Known Allergies Family History  Problem Relation Age of Onset   Diabetes Mother    Leukemia Father    Leukemia Sister    Diabetes Sister    Leukemia Brother    Multiple sclerosis Neg Hx    Autoimmune disease Neg Hx    Thyroid disease Neg Hx    PE: BP 120/80 (BP Location: Right Arm, Patient Position: Sitting, Cuff Size: Normal)   Pulse 75   Ht 5' 8"$  (1.727 m)   SpO2 99%   BMI 29.19 kg/m  Wt Readings from Last 3 Encounters:  05/22/22 192 lb (87.1 kg)  05/02/22 202 lb 12.8 oz (92 kg)  03/22/22 205 lb (93 kg)   Constitutional: overweight, in NAD Eyes: no exophthalmos ENT: no thyromegaly, no cervical lymphadenopathy Cardiovascular: RRR, No MRG Respiratory: CTA B Musculoskeletal: no deformities Skin: no rashes Neurological: + tremor with outstretched hands  ASSESSMENT: 1. DM2, non-insulin-dependent, controlled, without long-term complications  2.  Multiple  thyroid nodules  3.  History of hypothyroidism  PLAN:  1. Patient with longstanding, controlled, type 2 diabetes, on oral antidiabetic regimen with metformin only.  At last visit, HbA1c was higher, but still at goal, at 6.3%.  She was not checking sugars and was reticent to start.  I strongly recommended that she checked  once a day or at least every other day and sent a prescription for a One Touch Verio meter and supplies to her pharmacy.  We did not change her regimen as her HbA1c was at goal. -At today's visit, she started to check blood sugars.  They are at goal.  However, she describes severe diarrhea, after every meal, so she is barely able to leave the house.  Will go ahead and stop the metformin.  Since her HbA1c is low, I advised her to stay without any diabetic medication for now but to contact me if her sugars start increasing.  In that case, we need to start the medication without possible GI SEs (e.g. a SGLT2 inhibitor, if covered). - I suggested to:  Patient Instructions  Please stop Metformin ER.  Check blood sugars 1x a day or at least every other day.  Let me know if the sugars increase.  Please return in 4-6 months with your sugar log.   - we checked her HbA1c: 6.1% (lower) - advised to check sugars at different times of the day - 1x a day, rotating check times - advised for yearly eye exams >> she is UTD - return to clinic in 4-6 months   2.  Multiple thyroid nodules -Reviewed previous ultrasound reports, the latest from 08/2021. The dominant nodules in the left upper lobe were both biopsied with benign results.  They appear slightly smaller or stable on the latest ultrasound.  The rest of the nodules did not meet criteria for follow-up or biopsy. -No neck compression symptoms except for occasional choking with dry foods, which is not new for her -We we will recheck her ultrasound now  3.  History of hyperthyroidism -Resolved -Latest TFTs reviewed from 11/2021 and  these were normal. -We will repeat a TSH now   Component     Latest Ref Rng 11/05/2022  TSH     0.35 - 5.50 uIU/mL 1.08   Hemoglobin A1C     4.0 - 5.6 % 6.1 !     TSH is normal.   Philemon Kingdom, MD PhD [facility] Endocrinology

## 2022-11-19 ENCOUNTER — Ambulatory Visit (INDEPENDENT_AMBULATORY_CARE_PROVIDER_SITE_OTHER): Payer: Medicare HMO

## 2022-11-19 VITALS — BP 132/80 | HR 76 | Temp 97.9°F

## 2022-11-19 DIAGNOSIS — Z0000 Encounter for general adult medical examination without abnormal findings: Secondary | ICD-10-CM

## 2022-11-19 NOTE — Progress Notes (Signed)
Subjective:   Rhonda Hudson is a 67 y.o. female who presents for Medicare Annual (Subsequent) preventive examination.  Review of Systems     Cardiac Risk Factors include: advanced age (>4mn, >>91women);diabetes mellitus     Objective:    Today's Vitals   11/19/22 0903 11/19/22 0909  BP: 132/80   Pulse: 76   Temp: 97.9 F (36.6 C)   TempSrc: Oral   SpO2: 96%   PainSc:  0-No pain   There is no height or weight on file to calculate BMI.     11/19/2022    9:18 AM 11/10/2020    4:39 PM 07/11/2020   11:09 AM 07/04/2020   12:18 PM 12/06/2019    1:13 PM 11/04/2019   10:01 AM 10/28/2019    8:29 AM  Advanced Directives  Does Patient Have a Medical Advance Directive? Yes No No No Yes Yes Yes  Type of AParamedicof [place] will    Healthcare Power of Attorney Living will Living will  Does patient want to make changes to medical advance directive?     No - Patient declined    Copy of [place] Chart? No - copy requested        Would patient like information on creating a medical advance directive?  Yes (ED - Information included in AVS) No - Patient declined No - Patient declined       Current Medications (verified) Outpatient Encounter Medications as of 11/19/2022  Medication Sig   Accu-Chek Softclix Lancets lancets Use as instructed to check blood sugar 1X daily   atorvastatin (LIPITOR) 10 MG tablet TAKE 1 TABLET EVERY DAY   Blood Glucose Monitoring Suppl (ACCU-CHEK GUIDE) w/Device KIT Use as instructed to check blood sugar 1X daily   diphenhydrAMINE-APAP, sleep, (ACETAMINOPHEN PM PO) Take by mouth.   glucose blood (ACCU-CHEK GUIDE) test strip Use as instructed to check blood sugar 1X daily   metFORMIN (GLUCOPHAGE) 500 MG tablet Take 500 mg by mouth daily.   XARELTO 10 MG TABS tablet TAKE 1 TABLET EVERY DAY   azithromycin (ZITHROMAX) 500 MG tablet 1 tablet po daily x 3 days (Patient not taking: Reported on 11/19/2022)    promethazine-dextromethorphan (PROMETHAZINE-DM) 6.25-15 MG/5ML syrup Take 5 mLs by mouth 4 (four) times daily as needed for cough. (Patient not taking: Reported on 11/19/2022)   No facility-administered encounter medications on file as of 11/19/2022.    Allergies (verified) Patient has no known allergies.   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   Panic attacks    Hx of   Thickened endometrium    Thyroid nodule 03/03/2019   Past Surgical History:  Procedure Laterality Date   CHOLECYSTECTOMY     CYSTOSCOPY  11/04/2019   Procedure: CYSTOSCOPY;  Surgeon: MAzucena Fallen MD;  [facility]  Service: Gynecology;;  DILATION AND CURETTAGE OF UTERUS N/A 06/16/2018   Procedure: DILATATION AND CURETTAGE;  Surgeon: Emily Filbert, MD;  Location: [place];  Service: Gynecology;  Laterality: N/A;   ROBOTIC ASSISTED TOTAL HYSTERECTOMY WITH BILATERAL SALPINGO OOPHERECTOMY Bilateral 11/04/2019   Procedure: XI ROBOTIC ASSISTED TOTAL HYSTERECTOMY WITH BILATERAL SALPINGO OOPHORECTOMY/Pelvic Washings;  Surgeon: Azucena Fallen, MD;  Location: [facility];  Service: Gynecology;  Laterality: Bilateral;  Tracie to RNFA confirmed on 10/15/19 CS   TOE SURGERY     TUBAL LIGATION     US ECHOCARDIOGRAPHY  01-31-2009   EF 60%   Family History  Problem Relation Age of Onset   Diabetes Mother    Leukemia Father    Leukemia  Sister    Diabetes Sister    Leukemia Brother    Multiple sclerosis Neg Hx    Autoimmune disease Neg Hx    Thyroid disease Neg Hx    Social History   Socioeconomic History   Marital status: Divorced    Spouse name: Not on file   Number of children: 2   Years of education: 12   Highest education level: 12th grade  Occupational History   Occupation: Social Security Disability  Tobacco Use   Smoking status: Every Day    Packs/day: 0.50    Years: 40.00    Total pack years: 20.00    Types: Cigarettes    Start date: 1980   Smokeless tobacco: Never  Vaping Use   Vaping Use: Never used  Substance and Sexual Activity   Alcohol use: No    Alcohol/week: 0.0 standard drinks of alcohol   Drug use: No   Sexual activity: Not Currently    Birth control/protection: Post-menopausal  Other Topics Concern   Not on file  Social History Narrative   Lives with on of her 3 sons, no grandchildren   Caffeine use: coffee and coke daily   Right-handed   Retired   Exercise - walks, mows, chops her wood   11/2021   Social Determinants of Health   Financial Resource Strain: Low Risk  (11/19/2022)   Overall Financial Resource Strain (CARDIA)    Difficulty of Paying Living Expenses: Not hard at all  Food Insecurity: No Food Insecurity (11/19/2022)   Hunger Vital Sign    Worried About Running Out of Food in the Last Year: Never true    Ran Out of Food in the Last Year: Never true  Transportation Needs: No Transportation Needs (11/19/2022)   PRAPARE - Hydrologist (Medical): No    Lack of Transportation (Non-Medical): No  Physical Activity: Inactive (11/19/2022)   Exercise Vital Sign    Days of Exercise per Week: 0 days    Minutes of Exercise per Session: 0 min  Stress: Stress Concern Present (11/19/2022)   [place]    Feeling of Stress : To some extent  Social Connections: Moderately Isolated  (07/11/2020)   Social Connection and Isolation Panel [NHANES]    Frequency of Communication with Friends and Family: More than three times a week    Frequency of Social Gatherings with Friends and Family: More than three times a week    Attends Religious Services: More than 4 times per year    Active Member of [employer] or Organizations: No    Attends Archivist Meetings: Never    Marital Status: Divorced    Tobacco Counseling Ready to quit: Not Answered  Counseling given: Not Answered   Clinical Intake:  Pre-visit preparation completed: Yes  Pain : No/denies pain Pain Score: 0-No pain     Nutritional Status: BMI > 30  Obese Nutritional Risks: None Diabetes: Yes     Diabetic? Yes Nutrition Risk Assessment:  Has the patient had any N/V/D within the last 2 months?  No  Does the patient have any non-healing wounds?  No  Has the patient had any unintentional weight loss or weight gain?  No   Diabetes:  Is the patient diabetic?  Yes  If diabetic, was a CBG obtained today?  No  Did the patient bring in their glucometer from home?  No  How often do you monitor your CBG's? Twice weekly.   Financial Strains and Diabetes Management:  Are you having any financial strains with the device, your supplies or your medication? No .  Does the patient want to be seen by Chronic Care Management for management of their diabetes?  No  Would the patient like to be referred to a Nutritionist or for Diabetic Management?  No   Diabetic Exams:  Diabetic Eye Exam: Overdue for diabetic eye exam. Pt has been advised about the importance in completing this exam. Patient advised to call and schedule an eye exam. Diabetic Foot Exam: Completed 05/02/2022   Interpreter Needed?: No  Information entered by :: NAllen LPN   Activities of Daily Living    11/19/2022    9:22 AM  In your present state of health, do you have any difficulty performing the following activities:  Hearing? 0   Vision? 1  Difficulty concentrating or making decisions? 1  Walking or climbing stairs? 1  Dressing or bathing? 0  Doing errands, shopping? 0  Preparing Food and eating ? N  Using the Toilet? N  In the past six months, have you accidently leaked urine? N  Do you have problems with loss of bowel control? Y  Managing your Medications? N  Managing your Finances? N  Housekeeping or managing your Housekeeping? N    Patient Care Team: Tysinger, Camelia Eng, PA-C as PCP - General (Family Medicine)  Indicate any recent Medical Services you may have received from other than [facility] providers in the past year (date may be approximate).     Assessment:   This is a routine wellness examination for [place].  Hearing/Vision screen Vision Screening - Comments:: Regular eye exams, [facility]  Dietary issues and exercise activities discussed: Current Exercise Habits: The patient does not participate in regular exercise at present;Structured exercise class   Goals Addressed             This Visit's Progress    Patient Stated       11/19/2022, no goals       Depression Screen    11/19/2022    9:21 AM 11/14/2021   11:17 AM 11/02/2020    2:06 PM 07/11/2020   11:11 AM 07/11/2020   10:59 AM 07/04/2020   12:12 PM 06/21/2020   11:56 AM  PHQ 2/9 Scores  PHQ - 2 Score 3 6 0  '3 6 6  '$ PHQ- 9 Score '6 18   7 12 12  '$ Exception Documentation    Patient refusal       Fall Risk    11/19/2022    9:19 AM 05/22/2022    3:49 PM 02/05/2022   12:11 PM 11/14/2021   11:17 AM 11/02/2020    2:05 PM  [place]  in the past year? '1 1 1 1 1  '$ Comment just falls      Number falls in past yr: 1 0 1 0 0  Comment   4 falls    Injury with Fall? 1 0 0 0 0  Comment bruises  just bruising    Risk for fall due to : History of fall(s);Medication side effect Impaired balance/gait History of fall(s) Impaired balance/gait   Follow up Falls prevention discussed;Education provided;Falls evaluation completed Falls  evaluation completed Falls evaluation completed Falls evaluation completed     FALL RISK PREVENTION PERTAINING TO THE HOME:  Any stairs in or around the home? Yes  If so, are there any without handrails? No  Home free of loose throw rugs in walkways, pet beds, electrical cords, etc? Yes  Adequate lighting in your home to reduce risk of falls? Yes   ASSISTIVE DEVICES UTILIZED TO PREVENT FALLS:  Life alert? No  Use of a cane, walker or w/c? No  Grab bars in the bathroom? No  Shower chair or bench in shower? No  Elevated toilet seat or a handicapped toilet? No   TIMED UP AND GO:  Was the test performed? Yes .  Length of time to ambulate 10 feet: 6 sec.   Gait slow and steady without use of assistive device  Cognitive Function:        11/19/2022    9:24 AM  6CIT Screen  What Year? 0 points  What month? 0 points  What time? 0 points  Count back from 20 0 points  Months in reverse 4 points  Repeat phrase 6 points  Total Score 10 points    Immunizations Immunization History  Administered Date(s) Administered   Fluad Quad(high Dose 65+) 05/12/2019, 06/21/2020, 06/27/2021, 06/11/2022   Influenza,inj,Quad PF,6+ Mos 05/30/2016, 05/14/2017   Pneumococcal Polysaccharide-23 10/27/2019   Tdap 01/21/2017    TDAP status: Up to date  Flu Vaccine status: Up to date  Pneumococcal vaccine status: Up to date  Covid-19 vaccine status: Declined, Education has been provided regarding the importance of this vaccine but patient still declined. Advised may receive this vaccine at local pharmacy or Health Dept.or vaccine clinic. Aware to provide a copy of the vaccination record if obtained from local pharmacy or Health Dept. Verbalized acceptance and understanding.  Qualifies for Shingles Vaccine? Yes   Zostavax completed No   Shingrix Completed?: No.    Education has been provided regarding the importance of this vaccine. Patient has been advised to call insurance company to determine  out of pocket expense if they have not yet received this vaccine. Advised may also receive vaccine at local pharmacy or Health Dept. Verbalized acceptance and understanding.  Screening Tests Health Maintenance  Topic Date Due   Zoster Vaccines- Shingrix (1 of 2) Never done   Lung Cancer Screening  03/27/2010   MAMMOGRAM  02/26/2017   DEXA SCAN  Never done   Pneumonia Vaccine 30+ Years old (2 of 2 - PCV) 10/26/2020   OPHTHALMOLOGY EXAM  02/27/2022   Diabetic kidney evaluation - Urine ACR  11/15/2022   COVID-19 Vaccine (1) 06/27/2025 (Originally 03/10/1961)   Diabetic kidney evaluation - eGFR measurement  03/09/2023   FOOT EXAM  05/03/2023   HEMOGLOBIN A1C  05/06/2023   Medicare Annual Wellness (AWV)  11/19/2023   DTaP/Tdap/Td (2 - Td or Tdap) 01/22/2027   COLONOSCOPY (Pts 45-57yr Insurance coverage will need to be confirmed)  03/22/2032   INFLUENZA VACCINE  Completed   Hepatitis C  Screening  Completed   HPV VACCINES  Aged Out    Health Maintenance  Health Maintenance Due  Topic Date Due   Zoster Vaccines- Shingrix (1 of 2) Never done   Lung Cancer Screening  03/27/2010   MAMMOGRAM  02/26/2017   DEXA SCAN  Never done   Pneumonia Vaccine 92+ Years old (2 of 2 - PCV) 10/26/2020   OPHTHALMOLOGY EXAM  02/27/2022   Diabetic kidney evaluation - Urine ACR  11/15/2022    Colorectal cancer screening: Type of screening: Colonoscopy. Completed 03/22/2022. Repeat every 10 years  Mammogram status: decline  Bone Density status: decline  Lung Cancer Screening: (Low Dose CT Chest recommended if Age 51-80 years, 30 pack-year currently smoking OR have quit w/in 15years.) does not qualify.   Lung Cancer Screening Referral: no  Additional Screening:  Hepatitis C Screening: does qualify; Completed 10/27/2019  Vision Screening: Recommended annual ophthalmology exams for early detection of glaucoma and other disorders of the eye. Is the patient up to date with their annual eye exam?  No  Who  is the provider or what is the name of the office in which the patient attends annual eye exams? [facility] If pt is not established with a provider, would they like to be referred to a provider to establish care? No .   Dental Screening: Recommended annual dental exams for proper oral hygiene  Community Resource Referral / Chronic Care Management: CRR required this visit?  No   CCM required this visit?  No      Plan:     I have personally reviewed and noted the following in the patient's chart:   Medical and social history Use of alcohol, tobacco or illicit drugs  Current medications and supplements including opioid prescriptions. Patient is not currently taking opioid prescriptions. Functional ability and status Nutritional status Physical activity Advanced directives List of other physicians Hospitalizations, surgeries, and ER visits in previous 12 months Vitals Screenings to include cognitive, depression, and falls Referrals and appointments  In addition, I have reviewed and discussed with patient certain preventive protocols, quality metrics, and best practice recommendations. A written personalized care plan for preventive services as well as general preventive health recommendations were provided to patient.     Kellie Simmering, LPN   [id number]   Nurse Notes: none

## 2022-11-19 NOTE — Patient Instructions (Signed)
Rhonda Hudson , Thank you for taking time to come for your Medicare Wellness Visit. I appreciate your ongoing commitment to your health goals. Please review the following plan we discussed and let me know if I can assist you in the future.   These are the goals we discussed:  Goals      Patient Stated     11/19/2022, no goals        This is a list of the screening recommended for you and due dates:  Health Maintenance  Topic Date Due   Zoster (Shingles) Vaccine (1 of 2) Never done   Screening for Lung Cancer  03/27/2010   Mammogram  02/26/2017   DEXA scan (bone density measurement)  Never done   Pneumonia Vaccine (2 of 2 - PCV) 10/26/2020   Eye exam for diabetics  02/27/2022   Yearly kidney health urinalysis for diabetes  11/15/2022   COVID-19 Vaccine (1) 06/27/2025*   Yearly kidney function blood test for diabetes  03/09/2023   Complete foot exam   05/03/2023   Hemoglobin A1C  05/06/2023   Medicare Annual Wellness Visit  11/19/2023   DTaP/Tdap/Td vaccine (2 - Td or Tdap) 01/22/2027   Colon Cancer Screening  03/22/2032   Flu Shot  Completed   Hepatitis C Screening: USPSTF Recommendation to screen - Ages 18-79 yo.  Completed   HPV Vaccine  Aged Out  *Topic was postponed. The date shown is not the original due date.    Advanced directives: Please bring a copy of your POA (Power of Attorney) and/or Living Will to your next appointment.   Conditions/risks identified: smoking  Next appointment: Follow up in one year for your annual wellness visit    Preventive Care 69 Years and Older, Female Preventive care refers to lifestyle choices and visits with your health care provider that can promote health and wellness. What does preventive care include? A yearly physical exam. This is also called an annual well check. Dental exams once or twice a year. Routine eye exams. Ask your health care provider how often you should have your eyes checked. Personal lifestyle choices,  including: Daily care of your teeth and gums. Regular physical activity. Eating a healthy diet. Avoiding tobacco and drug use. Limiting alcohol use. Practicing safe sex. Taking low-dose aspirin every day. Taking vitamin and mineral supplements as recommended by your health care provider. What happens during an annual well check? The services and screenings done by your health care provider during your annual well check will depend on your age, overall health, lifestyle risk factors, and family history of disease. Counseling  Your health care provider may ask you questions about your: Alcohol use. Tobacco use. Drug use. Emotional well-being. Home and relationship well-being. Sexual activity. Eating habits. History of falls. Memory and ability to understand (cognition). Work and work Statistician. Reproductive health. Screening  You may have the following tests or measurements: Height, weight, and BMI. Blood pressure. Lipid and cholesterol levels. These may be checked every 5 years, or more frequently if you are over 26 years old. Skin check. Lung cancer screening. You may have this screening every year starting at age 64 if you have a 30-pack-year history of smoking and currently smoke or have quit within the past 15 years. Fecal occult blood test (FOBT) of the stool. You may have this test every year starting at age 59. Flexible sigmoidoscopy or colonoscopy. You may have a sigmoidoscopy every 5 years or a colonoscopy every 10 years starting at age  50. Hepatitis C blood test. Hepatitis B blood test. Sexually transmitted disease (STD) testing. Diabetes screening. This is done by checking your blood sugar (glucose) after you have not eaten for a while (fasting). You may have this done every 1-3 years. Bone density scan. This is done to screen for osteoporosis. You may have this done starting at age 15. Mammogram. This may be done every 1-2 years. Talk to your health care provider  about how often you should have regular mammograms. Talk with your health care provider about your test results, treatment options, and if necessary, the need for more tests. Vaccines  Your health care provider may recommend certain vaccines, such as: Influenza vaccine. This is recommended every year. Tetanus, diphtheria, and acellular pertussis (Tdap, Td) vaccine. You may need a Td booster every 10 years. Zoster vaccine. You may need this after age 68. Pneumococcal 13-valent conjugate (PCV13) vaccine. One dose is recommended after age 63. Pneumococcal polysaccharide (PPSV23) vaccine. One dose is recommended after age 3. Talk to your health care provider about which screenings and vaccines you need and how often you need them. This information is not intended to replace advice given to you by your health care provider. Make sure you discuss any questions you have with your health care provider. Document Released: 09/29/2015 Document Revised: 05/22/2016 Document Reviewed: 07/04/2015 Elsevier Interactive Patient Education  2017 [place] Prevention in the Home Falls can cause injuries. They can happen to people of all ages. There are many things you can do to make your home safe and to help prevent falls. What can I do on the outside of my home? Regularly fix the edges of walkways and driveways and fix any cracks. Remove anything that might make you trip as you walk through a door, such as a raised step or threshold. Trim any bushes or trees on the path to your home. Use bright outdoor lighting. Clear any walking paths of anything that might make someone trip, such as rocks or tools. Regularly check to see if handrails are loose or broken. Make sure that both sides of any steps have handrails. Any raised decks and porches should have guardrails on the edges. Have any leaves, snow, or ice cleared regularly. Use sand or salt on walking paths during winter. Clean up any spills in  your garage right away. This includes oil or grease spills. What can I do in the bathroom? Use night lights. Install grab bars by the toilet and in the tub and shower. Do not use towel bars as grab bars. Use non-skid mats or decals in the tub or shower. If you need to sit down in the shower, use a plastic, non-slip stool. Keep the floor dry. Clean up any water that spills on the floor as soon as it happens. Remove soap buildup in the tub or shower regularly. Attach bath mats securely with double-sided non-slip rug tape. Do not have throw rugs and other things on the floor that can make you trip. What can I do in the bedroom? Use night lights. Make sure that you have a light by your bed that is easy to reach. Do not use any sheets or blankets that are too big for your bed. They should not hang down onto the floor. Have a firm chair that has side arms. You can use this for support while you get dressed. Do not have throw rugs and other things on the floor that can make you trip. What can I do  in the kitchen? Clean up any spills right away. Avoid walking on wet floors. Keep items that you use a lot in easy-to-reach places. If you need to reach something above you, use a strong step stool that has a grab bar. Keep electrical cords out of the way. Do not use floor polish or wax that makes floors slippery. If you must use wax, use non-skid floor wax. Do not have throw rugs and other things on the floor that can make you trip. What can I do with my stairs? Do not leave any items on the stairs. Make sure that there are handrails on both sides of the stairs and use them. Fix handrails that are broken or loose. Make sure that handrails are as long as the stairways. Check any carpeting to make sure that it is firmly attached to the stairs. Fix any carpet that is loose or worn. Avoid having throw rugs at the top or bottom of the stairs. If you do have throw rugs, attach them to the floor with carpet  tape. Make sure that you have a light switch at the top of the stairs and the bottom of the stairs. If you do not have them, ask someone to add them for you. What else can I do to help prevent falls? Wear shoes that: Do not have high heels. Have rubber bottoms. Are comfortable and fit you well. Are closed at the toe. Do not wear sandals. If you use a stepladder: Make sure that it is fully opened. Do not climb a closed stepladder. Make sure that both sides of the stepladder are locked into place. Ask someone to hold it for you, if possible. Clearly mark and make sure that you can see: Any grab bars or handrails. First and last steps. Where the edge of each step is. Use tools that help you move around (mobility aids) if they are needed. These include: Canes. Walkers. Scooters. Crutches. Turn on the lights when you go into a dark area. Replace any light bulbs as soon as they burn out. Set up your furniture so you have a clear path. Avoid moving your furniture around. If any of your floors are uneven, fix them. If there are any pets around you, be aware of where they are. Review your medicines with your doctor. Some medicines can make you feel dizzy. This can increase your chance of falling. Ask your doctor what other things that you can do to help prevent falls. This information is not intended to replace advice given to you by your health care provider. Make sure you discuss any questions you have with your health care provider. Document Released: 06/29/2009 Document Revised: 02/08/2016 Document Reviewed: 10/07/2014 Elsevier Interactive Patient Education  2017 [employer].

## 2022-11-20 ENCOUNTER — Telehealth: Payer: Self-pay | Admitting: Medical

## 2022-11-20 NOTE — Telephone Encounter (Signed)
[name] asks if you can please give her a call

## 2022-11-20 NOTE — Telephone Encounter (Signed)
Called and spoke with patient. Patient misses you and wants to see you has her primary care. She will be calling your office to schedule but just wanted to give you a heads up that she will be scheduling with you.

## 2022-12-02 ENCOUNTER — Ambulatory Visit
Admission: RE | Admit: 2022-12-02 | Discharge: 2022-12-02 | Disposition: A | Discharge status: Home / Self Care | Payer: Medicare HMO | Source: Ambulatory Visit | Attending: Internal Medicine | Admitting: Internal Medicine

## 2022-12-02 DIAGNOSIS — E042 Nontoxic multinodular goiter: Secondary | ICD-10-CM | POA: Diagnosis not present

## 2022-12-23 ENCOUNTER — Telehealth: Payer: Self-pay

## 2022-12-23 DIAGNOSIS — E1165 Type 2 diabetes mellitus with hyperglycemia: Secondary | ICD-10-CM

## 2022-12-23 NOTE — Telephone Encounter (Signed)
Pt was taken off Metformin about a month ago and advised to contact the office if her blood sugars spiked. Pt advised sugars have spiked.

## 2022-12-24 MED ORDER — EMPAGLIFLOZIN 10 MG PO TABS: 10.0000 mg | ORAL_TABLET | Freq: Every day | ORAL | 1 refills | Status: DC

## 2022-12-24 NOTE — Telephone Encounter (Signed)
Pt contacted and advised rx for Jardiance was sent to preferred pharmacy.

## 2023-01-10 ENCOUNTER — Other Ambulatory Visit: Payer: Self-pay | Admitting: Medical

## 2023-01-10 DIAGNOSIS — E7800 Pure hypercholesterolemia, unspecified: Secondary | ICD-10-CM

## 2023-01-21 DIAGNOSIS — Z08 Encounter for follow-up examination after completed treatment for malignant neoplasm: Secondary | ICD-10-CM | POA: Diagnosis not present

## 2023-01-21 DIAGNOSIS — D1801 Hemangioma of skin and subcutaneous tissue: Secondary | ICD-10-CM | POA: Diagnosis not present

## 2023-01-21 DIAGNOSIS — Z85828 Personal history of other malignant neoplasm of skin: Secondary | ICD-10-CM | POA: Diagnosis not present

## 2023-01-21 DIAGNOSIS — L821 Other seborrheic keratosis: Secondary | ICD-10-CM | POA: Diagnosis not present

## 2023-01-21 DIAGNOSIS — L570 Actinic keratosis: Secondary | ICD-10-CM | POA: Diagnosis not present

## 2023-01-23 ENCOUNTER — Ambulatory Visit: Payer: Medicare HMO | Admitting: Family Medicine

## 2023-02-14 ENCOUNTER — Other Ambulatory Visit: Payer: Self-pay | Admitting: Medical

## 2023-03-10 ENCOUNTER — Other Ambulatory Visit: Payer: Self-pay | Admitting: Internal Medicine

## 2023-03-10 DIAGNOSIS — E1165 Type 2 diabetes mellitus with hyperglycemia: Secondary | ICD-10-CM

## 2023-03-13 ENCOUNTER — Ambulatory Visit (INDEPENDENT_AMBULATORY_CARE_PROVIDER_SITE_OTHER): Payer: Medicare HMO | Admitting: Family Medicine

## 2023-03-13 ENCOUNTER — Encounter: Payer: Self-pay | Admitting: Family Medicine

## 2023-03-13 VITALS — BP 130/74 | HR 84 | Temp 97.2°F | Ht 68.0 in | Wt 186.0 lb

## 2023-03-13 DIAGNOSIS — R2240 Localized swelling, mass and lump, unspecified lower limb: Secondary | ICD-10-CM

## 2023-03-13 NOTE — Progress Notes (Signed)
Chief Complaint  Patient presents with   Cyst    Knot on right lower leg, woke up this morning and it was there. Was not there last night when she went to bed. Feels like its making her leg inside, "creepy crawly." Scratched it this morning. Put cocoa butter on it.    She woke up with a knot on the front of her R leg. She noticed it when she first got out of bed. It is itchy, and has been scratching at it. It feels "uncomfortable" and itchy. Her dog sleep with her--but on top of the covers, couldn't have caused this. Didn't jump on her or scratch her. She denies banging her shin into anything, reports she didn't even get up to go to the bathroom. Has h/o falls but denies any in the last few weeks.   PMH, PSH, SH reviewed Smoker DM, Factor II deficiency, on xarelto  Lab Results  Component Value Date   HGBA1C 6.1 (A) 11/05/2022    Outpatient Encounter Medications as of 03/13/2023  Medication Sig Note   Accu-Chek Softclix Lancets lancets USE AS INSTRUCTED TO CHECK BLOOD SUGAR ONCE DAILY    atorvastatin (LIPITOR) 10 MG tablet TAKE 1 TABLET EVERY DAY    Blood Glucose Monitoring Suppl (ACCU-CHEK GUIDE) w/Device KIT Use as instructed to check blood sugar 1X daily    diphenhydrAMINE-APAP, sleep, (ACETAMINOPHEN PM PO) Take by mouth. 03/13/2023: Takes nightly   empagliflozin (JARDIANCE) 10 MG TABS tablet Take 1 tablet (10 mg total) by mouth daily before breakfast.    glucose blood (ACCU-CHEK GUIDE) test strip USE AS INSTRUCTED TO CHECK BLOOD SUGAR ONCE DAILY    rivaroxaban (XARELTO) 10 MG TABS tablet TAKE 1 TABLET EVERY DAY    [DISCONTINUED] azithromycin (ZITHROMAX) 500 MG tablet 1 tablet po daily x 3 days (Patient not taking: Reported on 11/19/2022)    [DISCONTINUED] metFORMIN (GLUCOPHAGE) 500 MG tablet Take 500 mg by mouth daily.    [DISCONTINUED] promethazine-dextromethorphan (PROMETHAZINE-DM) 6.25-15 MG/5ML syrup Take 5 mLs by mouth 4 (four) times daily as needed for cough. (Patient not  taking: Reported on 11/19/2022)    No facility-administered encounter medications on file as of 03/13/2023.   No Known Allergies  ROS: denies f/c, no bleeding, some bruising just on her hands. No n/v/bowel changes.   Denies any bites/ticks. No rashes elsewhere. Denies URI symptoms, chest pain, shortness of breath.   PHYSICAL EXAM:  BP 130/74   Pulse 84   Temp (!) 97.2 F (36.2 C) (Tympanic)   Ht 5\' 8"  (1.727 m) Comment: per patient  Wt 186 lb (84.4 kg) Comment: per patient, refused to weigh  BMI 28.28 kg/m   Wt Readings from Last 3 Encounters:  03/13/23 186 lb (84.4 kg)  05/22/22 192 lb (87.1 kg)  05/02/22 202 lb 12.8 oz (92 kg)   Well-appearing female, in no distress Heart: regular rate and rhythm Lungs: clear bilaterally Abdomen: soft, nontender Extremities: no edema, normal pulses  R anterior shin Focal area of soft tissue swelling measuring 13 x 7-7.5 cm. There is overlying purpura and excoriations. No crusting, erythema or warmth. No streaks  Skin: normal turgor. See LE as described above. Purpura on the back of both hands Psych: slightly flat affect. Normal hygiene and grooming Neuro: alert and oriented, normal gait.    ASSESSMENT/PLAN:  Localized swelling of lower leg - suspect trauma or insect bite, vs poss hematoma (on anticoag).  Encouraged cool compresses, antihistamine, HC prn. Reviewed s/sx infection, f/u if worsening  She  was asking about handicap placard, a little irritated that it has been only temporary and not permanent. Advised pt that she would need to discuss any handicap placard with her PCP.  Discussed that there are requirements that need to be met, and that he would have to decide that (since she had normal gait, with no assistive device, not wearing oxygen, no known chronic CV dz in her history, just h/o DVT)  She admitted that she would never put ice or cold compress on her leg, that she is too cold-natured and wouldn't tolerate it, even if  she was sitting outside in 90 degree heat.

## 2023-03-13 NOTE — Patient Instructions (Signed)
  It is hard to guess exactly the cause of the swelling--it may have been a bite/sting of some sort, vs minor trauma with some bleeding under the skin (hematoma).  I recommend taking an antihistamine (such as claritin, allegra or zyrtec; you can use benadryl, but be careful as it can make you sleepy)--this can help with swelling and itching. I also recommend icing or cool compresses--if there is any component of a hematoma (blood collection), the cold can help prevent it from growing. If you can't tolerate any ice, you can try just a cool towel, or maybe just compression if you can't even tolerate that. You can use a cortisone cream if the itching is bothersome. Scratching at it will cause more bruising, and possibly increase the risk for infection. You can try elevating the leg to see if this alleviates any of the discomfort.  Be on the lookout for signs of infection--increased redness, pain, streaks, fever.

## 2023-03-24 ENCOUNTER — Other Ambulatory Visit: Payer: Self-pay | Admitting: Medical

## 2023-03-24 DIAGNOSIS — E7800 Pure hypercholesterolemia, unspecified: Secondary | ICD-10-CM

## 2023-05-06 ENCOUNTER — Ambulatory Visit (INDEPENDENT_AMBULATORY_CARE_PROVIDER_SITE_OTHER): Payer: Medicare HMO | Admitting: Medical

## 2023-05-06 ENCOUNTER — Encounter: Payer: Self-pay | Admitting: Internal Medicine

## 2023-05-06 ENCOUNTER — Ambulatory Visit: Payer: Medicare HMO | Admitting: Internal Medicine

## 2023-05-06 ENCOUNTER — Encounter: Payer: Self-pay | Admitting: Medical

## 2023-05-06 VITALS — BP 148/84 | HR 79 | Ht 68.0 in | Wt 192.0 lb

## 2023-05-06 VITALS — BP 138/80 | HR 74 | Ht 68.0 in | Wt 192.0 lb

## 2023-05-06 DIAGNOSIS — E118 Type 2 diabetes mellitus with unspecified complications: Secondary | ICD-10-CM | POA: Diagnosis not present

## 2023-05-06 DIAGNOSIS — M47816 Spondylosis without myelopathy or radiculopathy, lumbar region: Secondary | ICD-10-CM

## 2023-05-06 DIAGNOSIS — R296 Repeated falls: Secondary | ICD-10-CM

## 2023-05-06 DIAGNOSIS — Z7901 Long term (current) use of anticoagulants: Secondary | ICD-10-CM | POA: Diagnosis not present

## 2023-05-06 DIAGNOSIS — E1165 Type 2 diabetes mellitus with hyperglycemia: Secondary | ICD-10-CM

## 2023-05-06 DIAGNOSIS — Z7984 Long term (current) use of oral hypoglycemic drugs: Secondary | ICD-10-CM

## 2023-05-06 DIAGNOSIS — E119 Type 2 diabetes mellitus without complications: Secondary | ICD-10-CM

## 2023-05-06 DIAGNOSIS — E042 Nontoxic multinodular goiter: Secondary | ICD-10-CM

## 2023-05-06 DIAGNOSIS — Z8639 Personal history of other endocrine, nutritional and metabolic disease: Secondary | ICD-10-CM | POA: Diagnosis not present

## 2023-05-06 LAB — POCT GLYCOSYLATED HEMOGLOBIN (HGB A1C): Hemoglobin A1C: 6.5 % — AB (ref 4.0–5.6)

## 2023-05-06 NOTE — Addendum Note (Signed)
Addended by: Judd Gaudier on: 05/06/2023 08:49 AM   Modules accepted: Orders

## 2023-05-06 NOTE — Progress Notes (Signed)
Subjective:  Rhonda Hudson is a 67 y.o. female who presents for Chief Complaint  Patient presents with   Consult    Having frequent falls. Was given handicapped placard for 6 months is here to ask for long term placard as she cannot afford to keep paying for one every 6 months.      Here for complaint of falls.  Patient Care Team: Allien Melberg, Cleda Mccreedy as PCP - General (Family Medicine) Dr. Zannie Kehr, dermatology Dr. Carlus Pavlov, endocrinology Dr. Rob Bunting, GI   She has underlying health issues including diabetes, history of DVT on chronic anticoagulation, history of thyroid disease, cataracts, amongst other issues.  Would like handicap placard.    Been having falls for the past year or so.  Her twin brother is having falls as well.  She notes falls at least once a weekly.  Sometimes she catches her self before hitting the ground.  She notes a fall in recent months where she broke her glasses and broke some upper teeth.  Denies tripping on items at home.  She notes she will be walking and just fall.  No lightheadedness, no dizziness.  No blacking out.  No palpations.    Denies urinary incontinences, but has overactive bladders.   Was having bowel incontinence but this was attributed to metformin.  Does feel some off balance and wobbly.    She thinks her back issues and left leg issues may be contributing to her fall.   Denies numbness or tingling.    Sees endocrinology regarding diabetes.  No other aggravating or relieving factors.    No other c/o.  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     empagliflozin (JARDIANCE) 10 MG TABS tablet Take 1 tablet (10 mg total) by mouth daily before breakfast. 90 tablet 1   glucose blood (ACCU-CHEK GUIDE) test strip USE AS INSTRUCTED TO CHECK BLOOD SUGAR ONCE DAILY 100 strip 3   rivaroxaban (XARELTO) 10 MG TABS tablet TAKE 1 TABLET EVERY DAY 90 tablet 0   No current facility-administered medications on file prior to visit.     The following portions of the patient's history were reviewed and updated as appropriate: allergies, current medications, past family history, past medical history, past social history, past surgical history and problem list.  ROS Otherwise as in subjective above    Objective: BP 138/80   Pulse 74   Ht 5\' 8"  (1.727 m)   Wt 192 lb (87.1 kg)   BMI 29.19 kg/m   Wt Readings from Last 3 Encounters:  05/06/23 192 lb (87.1 kg)  05/06/23 192 lb (87.1 kg)  03/13/23 186 lb (84.4 kg)    General appearance: alert, no distress, well developed, well nourished Neck: supple, no lymphadenopathy, no thyromegaly, no masses, no bruits Heart: RRR, normal S1, S2, no murmurs Lungs: CTA bilaterally, no wheezes, rhonchi, or rales Back: nontender, but decreased ROM, no swelling or deformity Legs nontender, normal HIP ROM, no swelling Pulses: 2+  radial pulses, 1+ pedal pulses, normal cap refill Ext: no edema Neuro: abnormal romberg, a little shaky or unstable standing with feet together, unable to do heel to toe walk, somewhat weak with UE and LE strength but still 4-5/5 UE and LE strength, otherwise CN2-12 intact  EKG reviewed  10/2020 MRI brain IMPRESSION: 1. No acute intracranial finding. Mild chronic small-vessel ischemic changes of the pons, similar to the study of 2018. 2. Small amount of layering fluid in the right maxillary sinus, similar to the study of 2018.     MRI Lumbar spine 05/2017 FINDINGS: Segmentation:  Standard.   Alignment:  Maintained.   Vertebrae: No fracture or worrisome lesion. A few small hemangiomas are noted.   Conus medullaris: Extends to the T12-L1 level and appears normal.   Paraspinal and other soft tissues: Small T2 hyperintense lesions in both kidneys are most consistent with cysts.   Disc levels:   T11-12 is imaged in the sagittal plane only and negative.   T12:  Negative.   L1-2:  Minimal disc bulge without stenosis.   L2-3:  Negative.   L3-4:  Shallow broad-based disc bulge without stenosis.   L4-5: Moderate to moderately severe facet arthropathy is worse on the left. There is a small central disc protrusion and ligamentum flavum thickening. The central spinal canal and neural foramina remain open.   L5-S1: There is facet degenerative disease and a minimal disc bulge. The central canal and foramina are widely patent.   IMPRESSION: Spondylosis most notable at L4-5 where facet degenerative disease results in trace anterolisthesis. There is a shallow central protrusion at this level but the central canal and foramina are open.      Assessment: Encounter Diagnoses  Name Primary?   Falls frequently Yes   Arthritis of lumbar spine    Type II diabetes mellitus with complication (HCC)    Chronic anticoagulation      Plan: We discussed symptoms and concerns.  EKG  with no new acute changes.  B12 level today.  I reviewed her recent hemoglobin A1c and foot exam done by the diabetic specialist today.  Is very likely that her arthritis and back issues are contributing to her falls.  She sees a back specialist that there is no record in the chart so we are going to have her sign for records today from her back specialist.  Referral to physical therapy for falls, fall prevention and arthritis  We discussed other causes of falls.  We discussed that if she does not see improvements or if she continues to have problems in the next 1 to 2 months we will possibly need to refer to neurology for further evaluation as well.  I did review the 2022 MRI brain in the chart record as well as the MRI 2018 L spine.  Completed  her handicap placard form today    Rhonda "Dondra Spry" was seen today for consult.  Diagnoses and all orders for this visit:  Falls frequently -     EKG 12-Lead -     Vitamin B12 -     Ambulatory referral to Physical Therapy  Arthritis of lumbar spine -     Ambulatory referral to Physical Therapy  Type II diabetes mellitus with complication (HCC) -     Vitamin B12  Chronic anticoagulation    Follow up: pending PT, labs
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
## 2023-05-06 NOTE — Patient Instructions (Addendum)
Please continue: - Jardiance 10 mg daily in am - set alarms on your phone to take it  Please return in 4 months with your sugar log.

## 2023-05-07 ENCOUNTER — Other Ambulatory Visit: Payer: Self-pay | Admitting: Medical

## 2023-05-07 ENCOUNTER — Other Ambulatory Visit (INDEPENDENT_AMBULATORY_CARE_PROVIDER_SITE_OTHER): Payer: Medicare HMO

## 2023-05-07 DIAGNOSIS — E7800 Pure hypercholesterolemia, unspecified: Secondary | ICD-10-CM

## 2023-05-07 DIAGNOSIS — R7989 Other specified abnormal findings of blood chemistry: Secondary | ICD-10-CM

## 2023-05-07 LAB — VITAMIN B12: Vitamin B-12: 235 pg/mL (ref 232–1245)

## 2023-05-07 MED ORDER — ATORVASTATIN CALCIUM 10 MG PO TABS
ORAL_TABLET | ORAL | 1 refills | Status: DC
Start: 2023-05-07 — End: 2023-10-27

## 2023-05-07 MED ORDER — VITAMIN B-12 1000 MCG PO TABS: 1000.0000 ug | ORAL_TABLET | Freq: Every day | ORAL | 1 refills | Status: DC

## 2023-05-07 MED ORDER — RIVAROXABAN 10 MG PO TABS: 10.0000 mg | ORAL_TABLET | Freq: Every day | ORAL | 1 refills | Status: DC

## 2023-05-07 MED ORDER — CYANOCOBALAMIN 1000 MCG/ML IJ SOLN
1000.0000 ug | Freq: Once | INTRAMUSCULAR | Status: AC
Start: 2023-05-07 — End: 2023-05-07
  Administered 2023-05-07: 1000 ug via INTRAMUSCULAR

## 2023-05-07 NOTE — Progress Notes (Signed)
B12 is low.  I will have you start B12 supplement oral.  If you want, you could also come in to do B12 injections every 2 weeks for 1 months, then monthly for 3 months.  Expect phone call about physical therapy.  Lets get you back for follow up in 2 months.

## 2023-05-22 ENCOUNTER — Other Ambulatory Visit (INDEPENDENT_AMBULATORY_CARE_PROVIDER_SITE_OTHER): Payer: Medicare HMO

## 2023-05-22 DIAGNOSIS — E538 Deficiency of other specified B group vitamins: Secondary | ICD-10-CM

## 2023-05-22 MED ORDER — CYANOCOBALAMIN 1000 MCG/ML IJ SOLN
1000.0000 ug | Freq: Once | INTRAMUSCULAR | Status: AC
Start: 2023-05-22 — End: 2023-05-22
  Administered 2023-05-22: 1000 ug via INTRAMUSCULAR

## 2023-06-04 ENCOUNTER — Telehealth: Payer: Self-pay | Admitting: Medical

## 2023-06-04 NOTE — Telephone Encounter (Signed)
Pt called and wanted to know if she still needs to come in and get her b12 shot.

## 2023-06-04 NOTE — Telephone Encounter (Signed)
Pt was notified.  She has come twice already and will start monthly shots for 3 months tomorrow

## 2023-06-05 ENCOUNTER — Other Ambulatory Visit (INDEPENDENT_AMBULATORY_CARE_PROVIDER_SITE_OTHER): Payer: Medicare HMO

## 2023-06-05 DIAGNOSIS — E538 Deficiency of other specified B group vitamins: Secondary | ICD-10-CM

## 2023-06-05 MED ORDER — CYANOCOBALAMIN 1000 MCG/ML IJ SOLN
1000.0000 ug | Freq: Once | INTRAMUSCULAR | Status: AC
Start: 2023-06-05 — End: 2023-06-05
  Administered 2023-06-05: 1000 ug via INTRAMUSCULAR

## 2023-06-10 ENCOUNTER — Other Ambulatory Visit: Payer: Self-pay

## 2023-06-10 ENCOUNTER — Ambulatory Visit (HOSPITAL_COMMUNITY): Payer: Medicare HMO | Attending: Medical | Admitting: Physical Therapy

## 2023-06-10 DIAGNOSIS — R2689 Other abnormalities of gait and mobility: Secondary | ICD-10-CM | POA: Diagnosis not present

## 2023-06-10 DIAGNOSIS — R296 Repeated falls: Secondary | ICD-10-CM | POA: Insufficient documentation

## 2023-06-10 DIAGNOSIS — M6281 Muscle weakness (generalized): Secondary | ICD-10-CM | POA: Insufficient documentation

## 2023-06-10 DIAGNOSIS — M47816 Spondylosis without myelopathy or radiculopathy, lumbar region: Secondary | ICD-10-CM | POA: Insufficient documentation

## 2023-06-10 DIAGNOSIS — R2681 Unsteadiness on feet: Secondary | ICD-10-CM | POA: Diagnosis not present

## 2023-06-10 NOTE — Therapy (Signed)
OUTPATIENT PHYSICAL THERAPY EVALUATION   Patient Name: Rhonda Hudson MRN: [id number] DOB:[birth year], 67 y.o., female Today's Date: 06/10/2023  END OF SESSION:  PT End of Session - 06/10/23 0756     Visit Number 1    Number of Visits 8    Date for PT Re-Evaluation 08/19/23    Authorization Type [employer]    PT Start Time 0800    PT Stop Time 0840    PT Time Calculation (min) 40 min    Activity Tolerance Patient tolerated treatment well             Past Medical History:  Diagnosis Date   Cancer (HCC)    skin Ca on phase and back   Cataracts, bilateral    Chronic anticoagulation 07/15/2017   Diabetes mellitus without complication (HCC)    Diverticulitis    DVT (deep venous thrombosis) (HCC)    DVT of lower extremity (deep venous thrombosis) (HCC) 01/04/2016   Factor II deficiency (HCC)    Hiatal hernia with gastroesophageal reflux    disease   History of prediabetes    Hyperthyroidism    Insomnia    Irritable bowel syndrome    Lupus anticoagulant positive 10/31/2019   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/03/2019   Panic attacks    Hx of   Thickened endometrium    Thyroid nodule 03/03/2019   Past Surgical History:  Procedure Laterality Date   CHOLECYSTECTOMY     CYSTOSCOPY  11/04/2019   Procedure: CYSTOSCOPY;  Surgeon: Shea Evans, MD;  Location: [facility];  Service: Gynecology;;   DILATION AND CURETTAGE OF UTERUS N/A 06/16/2018   Procedure: DILATATION AND CURETTAGE;  Surgeon: Allie Bossier, MD;  Location: [facility];  Service: Gynecology;  Laterality: N/A;   ROBOTIC ASSISTED TOTAL HYSTERECTOMY WITH BILATERAL SALPINGO OOPHERECTOMY Bilateral 11/04/2019   Procedure: XI ROBOTIC ASSISTED TOTAL HYSTERECTOMY WITH BILATERAL SALPINGO OOPHORECTOMY/Pelvic Washings;  Surgeon: Shea Evans, MD;  Location: [facility];  Service: Gynecology;  Laterality: Bilateral;  Tracie to RNFA confirmed on 10/15/19 CS   TOE SURGERY     TUBAL  LIGATION     US ECHOCARDIOGRAPHY  01-31-2009   EF 60%   Patient Active Problem List   Diagnosis Date Noted   Arthritis of lumbar spine 05/06/2023   Falls frequently 05/06/2023   Incontinence of feces 02/05/2022   OAB (overactive bladder) 02/05/2022   Diabetes mellitus without complication (HCC) 02/05/2022   Blood in stool 02/05/2022   Diabetic complication (HCC) 11/14/2021   Encounter for health maintenance examination in adult 11/14/2021   Post-menopausal 11/14/2021   Estrogen deficiency 11/14/2021   Encounter for screening mammogram for malignant neoplasm of breast 11/14/2021   Screen for colon cancer 11/14/2021   Type II diabetes mellitus with complication (HCC) 11/14/2021   Medicare annual wellness visit, subsequent 11/14/2021   Endometrial hyperplasia without atypia, complex 11/04/2019   S/P laparoscopic hysterectomy 11/04/2019   Lupus anticoagulant positive 10/31/2019   Anxiety and depression 10/27/2019   Elevated LDL cholesterol level 07/07/2019   Atypical nevi 06/29/2019   Thyroiditis, acute 03/17/2019   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/03/2019   Right thyroid nodule 03/03/2019   Elevated hemoglobin A1c 01/18/2019   Chronic anticoagulation 07/15/2017   History of prediabetes    Factor II deficiency (HCC)    Smoker 01/14/2017   Personal history of noncompliance with medical treatment, presenting hazards to health 01/14/2017   Acute loss of vision, left 10/14/2016   History  of hypothyroidism 01/04/2016   DVT of lower extremity (deep venous thrombosis) (HCC) 01/04/2016    PCP: Jac Canavan, PA-C  REFERRING PROVIDER: Jac Canavan, PA-C  REFERRING DIAG: R29.6 (ICD-10-CM) - Falls frequently M47.816 (ICD-10-CM) - Arthritis of lumbar spine  THERAPY DIAG:  Muscle weakness (generalized)  Other abnormalities of gait and mobility  Unsteadiness on feet  Repeated falls  Rationale for Evaluation and Treatment: Rehabilitation  ONSET DATE: 4-5  years  SUBJECTIVE:   SUBJECTIVE STATEMENT: Pt reports she has bad arthritis in her back and a herniated. Pt states she is not sure why she falls. She reports she can be walking or standing and then for some reason she just falls. Sometimes it feels like her legs give out on her. Last fall occurred while she was splitting wood outside. Pt reports L LE gets blood clots. Pt states she has pain on her L lower back. Pt uses salon pas patches which does help with her pain. Difficulty tolerating prolonged standing for cooking and vacuuming. Pt states she has plans to leave for 2 weeks to [place].   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 Yes: NPRS scale: 5 currently; at worst 10/10 Pain location: L low back Pain description: Dull, aching Aggravating factors: Sitting<>standing, laying flat, Raking the yard, splitting wood Relieving factors: Salon pas, rest  PRECAUTIONS: None  RED FLAGS: None   WEIGHT BEARING RESTRICTIONS: No  FALLS:  Has patient fallen in last 6 months? Yes. Number of falls >10  LIVING ENVIRONMENT: Lives with:  niece, son Lives in: House/apartment Stairs: Yes: External: 8 steps; on right going up (has fallen on steps) Has following equipment at home: None  OCCUPATION: Retired (has factor 2), does yard work and house work, loves her little dog  PLOF: Independent  PATIENT GOALS: "I don't have any goals. My doctor sent me here." After discussion, talked about improving strength for her balance and transfers  NEXT MD VISIT: n/a  OBJECTIVE:   DIAGNOSTIC FINDINGS: Nothing recent  PATIENT SURVEYS:  Lower Extremity Functional Score: 25 / 80 = 31.3 %  COGNITION: Overall cognitive status: Within functional limits for tasks assessed     SENSATION: WFL  EDEMA:  None  MUSCLE LENGTH: Did not assess  POSTURE: rounded  shoulders, increased lumbar lordosis, increased thoracic kyphosis, and flexed trunk   PALPATION: TTP and muscle tension along L QL and lumbar paraspinal  LOWER EXTREMITY ROM:  Active ROM Right eval Left eval  Hip flexion    Hip extension    Hip abduction    Hip adduction    Hip internal rotation    Hip external rotation    Knee flexion    Knee extension    Ankle dorsiflexion    Ankle plantarflexion    Ankle inversion    Ankle eversion     (Blank rows = not tested)  LOWER EXTREMITY MMT:  MMT Right eval Left eval  Hip flexion 4 4  Hip extension 2+ 2+  Hip abduction 3 2+  Hip adduction    Hip internal rotation    Hip external rotation    Knee flexion 4 4  Knee extension 4- 4-  Ankle dorsiflexion    Ankle plantarflexion    Ankle inversion    Ankle eversion     (Blank rows = not tested)  LOWER EXTREMITY SPECIAL TESTS:  Did not  assess Unable to tolerate laying on L or laying supine  FUNCTIONAL TESTS:  5 times sit to stand: 53.12 sec with heavy UE support DGI: 14/24  [facility] PT Assessment - 06/10/23 0001       Standardized Balance Assessment   Standardized Balance Assessment Dynamic Gait Index      Dynamic Gait Index   Level Surface Mild Impairment    Change in Gait Speed Mild Impairment    Gait with Horizontal Head Turns Normal    Gait with Vertical Head Turns Normal    Gait and Pivot Turn Moderate Impairment    Step Over Obstacle Severe Impairment    Step Around Obstacles Mild Impairment    Steps Moderate Impairment    Total Score 14              GAIT: Distance walked: In clinic Assistive device utilized: None Level of assistance: SBA Comments: Antalgic pattern; decreased stance time on L LE, keeps L LE mostly extended throughout   TODAY'S TREATMENT:                                                                                                                              DATE: 06/10/23 Deferred initiating HEP today due to pt stating she is  going on vacation    PATIENT EDUCATION:  Education details: Exam findings, POC Person educated: Patient Education method: Explanation, Demonstration, and Handouts Education comprehension: verbalized understanding, returned demonstration, and needs further education  HOME EXERCISE PROGRAM: To be initiated next session  ASSESSMENT:  CLINICAL IMPRESSION: Patient is a 67 y.o. F who was seen today for physical therapy evaluation and treatment for frequent falls and L lumbar pain. Assessment significant for very weak bilat LEs (especially bilat hips), muscle spasm/pain in lumbar muscles, and high risk of falls based on her 5x STS and DGI affecting safety with transfers, home and community amb. Assessment limited due to pt not able to tolerate supine position or L sidelying. Pt reports she is unsure how PT will help her; however, we discussed that due to her increased muscle weakness she is likely overcompensating with her back and it decreases her stability with her activities leading to falls. Pt will highly benefit from PT to address her issues to improve her safety.   OBJECTIVE IMPAIRMENTS: Abnormal gait, decreased activity tolerance, decreased balance, decreased coordination, decreased endurance, decreased mobility, difficulty walking, decreased ROM, decreased strength, hypomobility, increased fascial restrictions, increased muscle spasms, impaired flexibility, improper body mechanics, postural dysfunction, and pain.   ACTIVITY LIMITATIONS: carrying, lifting, bending, sitting, standing, squatting, sleeping, stairs, transfers, bed mobility, bathing, locomotion level, and caring for others  PARTICIPATION LIMITATIONS: meal prep, cleaning, laundry, driving, shopping, community activity, and yard work  PERSONAL FACTORS: Age, Fitness, Past/current experiences, and Time since onset of injury/illness/exacerbation are also affecting patient's functional outcome.   REHAB POTENTIAL: Good  CLINICAL  DECISION MAKING: Evolving/moderate complexity  EVALUATION COMPLEXITY: Moderate   GOALS: Goals reviewed with patient?  Yes  SHORT TERM GOALS: Target date: 07/22/2023   Pt will be ind with initial HEP Baseline: Goal status: INITIAL  2.  Pt will have improved 5x STS to </=46 sec to demo improving functional LE strength Baseline:  Goal status: INITIAL   LONG TERM GOALS: Target date: 08/19/2023   Pt will be ind with management and progression of HEP Baseline:  Goal status: INITIAL  2.  Pt will be able to perform 5x STS in </=30 sec to demo MCID and improved transfers Baseline:  Goal status: INITIAL  3.  Pt will have improved DGI to >/=20/24 for reduced fall risk Baseline:  Goal status: INITIAL  4.  Pt will have improved LEFS to >/=41.3% to demo MCID Baseline:  Goal status: INITIAL  5.  Pt will be able to tolerate standing at least 15-20 min for cooking tasks at home Baseline:  Goal status: INITIAL   PLAN:  PT FREQUENCY: 1-2x/week (to begin in 2 weeks after she returns from [place])  PT DURATION: 8 weeks  PLANNED INTERVENTIONS: Therapeutic exercises, Therapeutic activity, Neuromuscular re-education, Balance training, Gait training, Patient/Family education, Self Care, Joint mobilization, Stair training, Aquatic Therapy, Dry Needling, Electrical stimulation, Spinal mobilization, Cryotherapy, Moist heat, Taping, Vasopneumatic device, Ionotophoresis 4mg /ml Dexamethasone, Manual therapy, and Re-evaluation  PLAN FOR NEXT SESSION: Initiate HEP for strength and balance.    Shareka Casale April Ma L Season Astacio, PT 06/10/2023, 8:58 AM

## 2023-06-23 ENCOUNTER — Telehealth: Payer: Self-pay

## 2023-06-23 NOTE — Telephone Encounter (Signed)
This is an antibiotic. She would need to be seen for whatever reason she needs this. Or this could be an auto- refill . Check with patient

## 2023-06-23 NOTE — Telephone Encounter (Signed)
Faxed request for azithromycin

## 2023-06-24 ENCOUNTER — Encounter (HOSPITAL_COMMUNITY): Payer: Medicare HMO | Admitting: Physical Therapy

## 2023-06-25 ENCOUNTER — Other Ambulatory Visit (INDEPENDENT_AMBULATORY_CARE_PROVIDER_SITE_OTHER): Payer: Self-pay

## 2023-06-25 ENCOUNTER — Encounter: Payer: Self-pay | Admitting: Orthopaedic Surgery

## 2023-06-25 ENCOUNTER — Ambulatory Visit: Payer: Medicare HMO | Admitting: Orthopaedic Surgery

## 2023-06-25 VITALS — BP 125/82 | HR 73 | Ht 68.0 in | Wt 192.0 lb

## 2023-06-25 DIAGNOSIS — M545 Low back pain, unspecified: Secondary | ICD-10-CM

## 2023-06-25 NOTE — Progress Notes (Unsigned)
Office Visit Note   Patient: Rhonda Hudson           Date of Birth: 31-Aug-1956           MRN: [id number] Visit Date: 06/25/2023              Requested by: Jac Canavan, PA-C [address] [place],  [place] [address] PCP: Jac Canavan, PA-C   Assessment & Plan: Visit Diagnoses:  1. Acute low back pain without sciatica, unspecified back pain laterality     Plan: She had gotten good relief in the past with facet injections.  She may be a candidate now for radiofrequency ablation.  Will set her up for a facet injection probably at L4-5 with Dr. Alvester Morin.  She can follow-up with me 1 to 2 months after treatment.  Follow-Up Instructions: No follow-ups on file.   Orders:  Orders Placed This Encounter  Procedures   XR Lumbar Spine 2-3 Views   No orders of the defined types were placed in this encounter.     Procedures: No procedures performed   Clinical Data: No additional findings.   Subjective: Chief Complaint  Patient presents with   Lower Back - Pain    HPI 67 year old female with low back pain pain sitting standing and walking.  Worse with standing and walking.  She does better leaning over grocery cart.  She has pain with rotation turning and twisting such as mopping sweeping.  She uses a pain patch she has had physical therapy as recent as 06/10/2023.  No chills or fever no bowel bladder symptoms no associated neck pain.  Past history of good relief with facet injection L4-5.  Previous medial branch block 2018.  Review of Systems positive history of DVT elevated A1c anxiety and depression.  Patient on Jardiance and Xarelto.  Positive for elevated cholesterol on medication Lipitor.  All other systems noncontributory to HPI.   Objective: Vital Signs: BP 125/82   Pulse 73   Ht 5\' 8"  (1.727 m)   Wt 192 lb (87.1 kg)   BMI 29.19 kg/m   Physical Exam Constitutional:      Appearance: She is well-developed.  HENT:     Head: Normocephalic.     Right  Ear: External ear normal.     Left Ear: External ear normal. There is no impacted cerumen.  Eyes:     Pupils: Pupils are equal, round, and reactive to light.  Neck:     Thyroid: No thyromegaly.     Trachea: No tracheal deviation.  Cardiovascular:     Rate and Rhythm: Normal rate.  Pulmonary:     Effort: Pulmonary effort is normal.  Abdominal:     Palpations: Abdomen is soft.  Musculoskeletal:     Cervical back: No rigidity.  Skin:    General: Skin is warm and dry.  Neurological:     Mental Status: She is alert and oriented to person, place, and time.  Psychiatric:        Behavior: Behavior normal.     Ortho Exam knee and ankle jerk are intact she is able to heel and toe walk pain with palpation lumbosacral spine.  Negative logroll hips pulses are 2+.  Specialty Comments:  No specialty comments available.  Imaging: No results found.   PMFS History: Patient Active Problem List   Diagnosis Date Noted   Arthritis of lumbar spine 05/06/2023   Falls frequently 05/06/2023   Incontinence of feces 02/05/2022   OAB (overactive  bladder) 02/05/2022   Diabetes mellitus without complication (HCC) 02/05/2022   Blood in stool 02/05/2022   Diabetic complication (HCC) 11/14/2021   Encounter for health maintenance examination in adult 11/14/2021   Post-menopausal 11/14/2021   Estrogen deficiency 11/14/2021   Encounter for screening mammogram for malignant neoplasm of breast 11/14/2021   Screen for colon cancer 11/14/2021   Type II diabetes mellitus with complication (HCC) 11/14/2021   Medicare annual wellness visit, subsequent 11/14/2021   Endometrial hyperplasia without atypia, complex 11/04/2019   S/P laparoscopic hysterectomy 11/04/2019   Lupus anticoagulant positive 10/31/2019   Anxiety and depression 10/27/2019   Elevated LDL cholesterol level 07/07/2019   Atypical nevi 06/29/2019   Thyroiditis, acute 03/17/2019   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/03/2019   Right thyroid  nodule 03/03/2019   Elevated hemoglobin A1c 01/18/2019   Chronic anticoagulation 07/15/2017   History of prediabetes    Factor II deficiency (HCC)    Smoker 01/14/2017   Personal history of noncompliance with medical treatment, presenting hazards to health 01/14/2017   Acute loss of vision, left 10/14/2016   History of hypothyroidism 01/04/2016   DVT of lower extremity (deep venous thrombosis) (HCC) 01/04/2016   Past Medical History:  Diagnosis Date   Cancer (HCC)    skin Ca on phase and back   Cataracts, bilateral    Chronic anticoagulation 07/15/2017   Diabetes mellitus without complication (HCC)    Diverticulitis    DVT (deep venous thrombosis) (HCC)    DVT of lower extremity (deep venous thrombosis) (HCC) 01/04/2016   Factor II deficiency (HCC)    Hiatal hernia with gastroesophageal reflux    disease   History of prediabetes    Hyperthyroidism    Insomnia    Irritable bowel syndrome    Lupus anticoagulant positive 10/31/2019   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/03/2019   Panic attacks    Hx of   Thickened endometrium    Thyroid nodule 03/03/2019    Family History  Problem Relation Age of Onset   Diabetes Mother    Leukemia Father    Leukemia Sister    Diabetes Sister    Leukemia Brother    Multiple sclerosis Neg Hx    Autoimmune disease Neg Hx    Thyroid disease Neg Hx     Past Surgical History:  Procedure Laterality Date   CHOLECYSTECTOMY     CYSTOSCOPY  11/04/2019   Procedure: CYSTOSCOPY;  Surgeon: Shea Evans, MD;  Location: [facility];  Service: Gynecology;;   DILATION AND CURETTAGE OF UTERUS N/A 06/16/2018   Procedure: DILATATION AND CURETTAGE;  Surgeon: Allie Bossier, MD;  Location: [facility];  Service: Gynecology;  Laterality: N/A;   ROBOTIC ASSISTED TOTAL HYSTERECTOMY WITH BILATERAL SALPINGO OOPHERECTOMY Bilateral 11/04/2019   Procedure: XI ROBOTIC ASSISTED TOTAL HYSTERECTOMY WITH BILATERAL SALPINGO OOPHORECTOMY/Pelvic Washings;   Surgeon: Shea Evans, MD;  Location: [facility];  Service: Gynecology;  Laterality: Bilateral;  Tracie to RNFA confirmed on 10/15/19 CS   TOE SURGERY     TUBAL LIGATION     US ECHOCARDIOGRAPHY  01-31-2009   EF 60%   Social History   Occupational History   Occupation: Social Security Disability  Tobacco Use   Smoking status: Every Day    Current packs/day: 0.50    Average packs/day: 0.5 packs/day for 44.8 years (22.4 ttl pk-yrs)    Types: Cigarettes    Start date: 1980   Smokeless tobacco: Never  Vaping Use  Vaping status: Never Used  Substance and Sexual Activity   Alcohol use: No    Alcohol/week: 0.0 standard drinks of alcohol   Drug use: No   Sexual activity: Not Currently    Birth control/protection: Post-menopausal

## 2023-07-01 ENCOUNTER — Telehealth: Payer: Self-pay

## 2023-07-01 ENCOUNTER — Encounter (HOSPITAL_COMMUNITY): Payer: Medicare HMO | Admitting: Physical Therapy

## 2023-07-01 NOTE — Telephone Encounter (Signed)
Faxed request for azithromycin

## 2023-07-01 NOTE — Telephone Encounter (Signed)
We got this last week for pt. This is an antibiotic.   Pt did not request this per your notes last week. Please deny

## 2023-07-03 ENCOUNTER — Other Ambulatory Visit (INDEPENDENT_AMBULATORY_CARE_PROVIDER_SITE_OTHER): Payer: Medicare HMO

## 2023-07-03 DIAGNOSIS — Z23 Encounter for immunization: Secondary | ICD-10-CM

## 2023-07-03 DIAGNOSIS — E538 Deficiency of other specified B group vitamins: Secondary | ICD-10-CM

## 2023-07-03 MED ORDER — CYANOCOBALAMIN 1000 MCG/ML IJ SOLN
1000.0000 ug | Freq: Once | INTRAMUSCULAR | Status: AC
Start: 2023-07-03 — End: 2023-07-03
  Administered 2023-07-03: 1000 ug via INTRAMUSCULAR

## 2023-07-08 ENCOUNTER — Encounter (HOSPITAL_COMMUNITY): Payer: Medicare HMO | Admitting: Physical Therapy

## 2023-07-10 ENCOUNTER — Other Ambulatory Visit: Payer: Self-pay

## 2023-07-10 ENCOUNTER — Ambulatory Visit: Payer: Medicare HMO | Admitting: Physical Medicine and Rehabilitation

## 2023-07-10 VITALS — BP 121/80 | HR 86

## 2023-07-10 DIAGNOSIS — M47816 Spondylosis without myelopathy or radiculopathy, lumbar region: Secondary | ICD-10-CM | POA: Diagnosis not present

## 2023-07-10 MED ORDER — METHYLPREDNISOLONE ACETATE 40 MG/ML IJ SUSP
40.0000 mg | Freq: Once | INTRAMUSCULAR | Status: AC
  Administered 2023-07-10: 40 mg

## 2023-07-10 NOTE — Progress Notes (Signed)
Rhonda Hudson - 67 y.o. female MRN [id number]  Date of birth: 14-Aug-1956  Office Visit Note: Visit Date: 07/10/2023 PCP: Jac Canavan, PA-C Referred by: Jac Canavan, PA-C  Subjective: Chief Complaint  Patient presents with   Lower Back - Pain   HPI:  Rhonda Hudson is a 67 y.o. female who comes in today at the request of Dr. Annell Greening for planned Left  L4-5 Lumbar facet/medial branch block with fluoroscopic guidance.  The patient has failed conservative care including home exercise, medications, time and activity modification.  This injection will be diagnostic and hopefully therapeutic.  Please see requesting physician notes for further details and justification.  Exam has shown concordant pain with facet joint loading.   ROS Otherwise per HPI.  Assessment & Plan: Visit Diagnoses:    ICD-10-CM   1. Spondylosis without myelopathy or radiculopathy, lumbar region  M47.816 XR C-ARM NO REPORT    Facet Injection    methylPREDNISolone acetate (DEPO-MEDROL) injection 40 mg      Plan: No additional findings.   Meds & Orders:  Meds ordered this encounter  Medications   methylPREDNISolone acetate (DEPO-MEDROL) injection 40 mg    Orders Placed This Encounter  Procedures   Facet Injection   XR C-ARM NO REPORT    Follow-up: Return for visit to requesting provider as needed.   Procedures: No procedures performed  Lumbar Facet Joint Intra-Articular Injection(s) with Fluoroscopic Guidance  Patient: Rhonda Hudson      Date of Birth: 1955/11/18 MRN: [id number] PCP: Jac Canavan, PA-C      Visit Date: 07/10/2023   Universal Protocol:    Date/Time: 07/10/2023  Consent Given By: the patient  Position: PRONE   Additional Comments: Vital signs were monitored before and after the procedure. Patient was prepped and draped in the usual sterile fashion. The correct patient, procedure, and site was verified.   Injection Procedure Details:  Procedure Site  One Meds Administered:  Meds ordered this encounter  Medications   methylPREDNISolone acetate (DEPO-MEDROL) injection 40 mg     Laterality: Left  Location/Site:  L4-L5  Needle size: 22 guage  Needle type: Spinal  Needle Placement: Articular  Findings:  -Comments: Excellent flow of contrast producing a partial arthrogram.  Procedure Details: The fluoroscope beam is vertically oriented in AP, and the inferior recess is visualized beneath the lower pole of the inferior apophyseal process, which represents the target point for needle insertion. When direct visualization is difficult the target point is located at the medial projection of the vertebral pedicle. The region overlying each aforementioned target is locally anesthetized with a 1 to 2 ml. volume of 1% Lidocaine without Epinephrine.   The spinal needle was inserted into each of the above mentioned facet joints using biplanar fluoroscopic guidance. A 0.25 to 0.5 ml. volume of Isovue-250 was injected and a partial facet joint arthrogram was obtained. A single spot film was obtained of the resulting arthrogram.    One to 1.25 ml of the steroid/anesthetic solution was then injected into each of the facet joints noted above.   Additional Comments:  No complications occurred Dressing: 2 x 2 sterile gauze and Band-Aid    Post-procedure details: Patient was observed during the procedure. Post-procedure instructions were reviewed.  Patient left the clinic in stable condition.    Clinical History: No specialty comments available.     Objective:  VS:  HT:    WT:   BMI:     BP:121/80  HR:86bpm  TEMP: ( )  RESP:  Physical Exam Vitals and nursing note reviewed.  Constitutional:      General: She is not in acute distress.    Appearance: Normal appearance. She is not ill-appearing.  HENT:     Head: Normocephalic and atraumatic.     Right Ear: External ear normal.     Left Ear: External ear normal.  Eyes:      Extraocular Movements: Extraocular movements intact.  Cardiovascular:     Rate and Rhythm: Normal rate.     Pulses: Normal pulses.  Pulmonary:     Effort: Pulmonary effort is normal. No respiratory distress.  Abdominal:     General: There is no distension.     Palpations: Abdomen is soft.  Musculoskeletal:        General: Tenderness present.     Cervical back: Neck supple.     Right lower leg: No edema.     Left lower leg: No edema.     Comments: Patient has good distal strength with no pain over the greater trochanters.  No clonus or focal weakness.  Skin:    Findings: No erythema, lesion or rash.  Neurological:     General: No focal deficit present.     Mental Status: She is alert and oriented to person, place, and time.     Sensory: No sensory deficit.     Motor: No weakness or abnormal muscle tone.     Coordination: Coordination normal.  Psychiatric:        Mood and Affect: Mood normal.        Behavior: Behavior normal.      Imaging: No results found.

## 2023-07-10 NOTE — Progress Notes (Signed)
Functional Pain Scale - descriptive words and definitions  Distracting (5)    Aware of pain/able to complete some ADL's but limited by pain/sleep is affected and active distractions are only slightly useful. Moderate range order  Average Pain  varies on activity   +Driver, -BT, -Dye Allergies.  Lower back pain on left side with no radiation in the legs. Uses Salon Pas patches with relief

## 2023-07-10 NOTE — Patient Instructions (Signed)

## 2023-07-10 NOTE — Procedures (Signed)
Lumbar Facet Joint Intra-Articular Injection(s) with Fluoroscopic Guidance  Patient: Rhonda Hudson      Date of Birth: 1955/11/19 MRN: [id number] PCP: Jac Canavan, PA-C      Visit Date: 07/10/2023   Universal Protocol:    Date/Time: 07/10/2023  Consent Given By: the patient  Position: PRONE   Additional Comments: Vital signs were monitored before and after the procedure. Patient was prepped and draped in the usual sterile fashion. The correct patient, procedure, and site was verified.   Injection Procedure Details:  Procedure Site One Meds Administered:  Meds ordered this encounter  Medications   methylPREDNISolone acetate (DEPO-MEDROL) injection 40 mg     Laterality: Left  Location/Site:  L4-L5  Needle size: 22 guage  Needle type: Spinal  Needle Placement: Articular  Findings:  -Comments: Excellent flow of contrast producing a partial arthrogram.  Procedure Details: The fluoroscope beam is vertically oriented in AP, and the inferior recess is visualized beneath the lower pole of the inferior apophyseal process, which represents the target point for needle insertion. When direct visualization is difficult the target point is located at the medial projection of the vertebral pedicle. The region overlying each aforementioned target is locally anesthetized with a 1 to 2 ml. volume of 1% Lidocaine without Epinephrine.   The spinal needle was inserted into each of the above mentioned facet joints using biplanar fluoroscopic guidance. A 0.25 to 0.5 ml. volume of Isovue-250 was injected and a partial facet joint arthrogram was obtained. A single spot film was obtained of the resulting arthrogram.    One to 1.25 ml of the steroid/anesthetic solution was then injected into each of the facet joints noted above.   Additional Comments:  No complications occurred Dressing: 2 x 2 sterile gauze and Band-Aid    Post-procedure details: Patient was observed during the  procedure. Post-procedure instructions were reviewed.  Patient left the clinic in stable condition.

## 2023-07-15 ENCOUNTER — Encounter (HOSPITAL_COMMUNITY): Payer: Medicare HMO | Admitting: Physical Therapy

## 2023-07-16 ENCOUNTER — Ambulatory Visit (INDEPENDENT_AMBULATORY_CARE_PROVIDER_SITE_OTHER): Payer: Medicare HMO | Admitting: Family Medicine

## 2023-07-16 ENCOUNTER — Encounter: Payer: Self-pay | Admitting: Family Medicine

## 2023-07-16 VITALS — BP 126/80 | HR 72 | Temp 97.7°F

## 2023-07-16 DIAGNOSIS — D682 Hereditary deficiency of other clotting factors: Secondary | ICD-10-CM | POA: Diagnosis not present

## 2023-07-16 DIAGNOSIS — M25552 Pain in left hip: Secondary | ICD-10-CM | POA: Diagnosis not present

## 2023-07-16 DIAGNOSIS — Z7901 Long term (current) use of anticoagulants: Secondary | ICD-10-CM | POA: Diagnosis not present

## 2023-07-16 NOTE — Progress Notes (Signed)
   Subjective:    Patient ID: Rhonda Hudson, female    DOB: 1956/04/20, 67 y.o.   MRN: [id number]  HPI She states that she has a 1 day history of left leg pain that hurts the entire leg.  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  No history of recent injury or overuse.   Review of Systems     Objective:    Physical Exam Pain on motion of the hip in any direction.  No tenderness over the greater trochanter.  No tenderness over the femur.  Pulses difficult to feel but good capillary refill.  Negative Homans' sign.  No lower extremity edema or atrophy noted.       Assessment & Plan:  Chronic anticoagulation  Factor II deficiency (HCC)  Left hip pain I explained to her that I thought the pain was more in her hip but she Insisting that it was in her femur although she is not tender in that area.  Offered pain medication but she declined.  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

## 2023-07-22 ENCOUNTER — Encounter (HOSPITAL_COMMUNITY): Payer: Medicare HMO

## 2023-07-29 ENCOUNTER — Encounter (HOSPITAL_COMMUNITY): Payer: Medicare HMO

## 2023-07-30 DIAGNOSIS — D225 Melanocytic nevi of trunk: Secondary | ICD-10-CM | POA: Diagnosis not present

## 2023-07-30 DIAGNOSIS — Z08 Encounter for follow-up examination after completed treatment for malignant neoplasm: Secondary | ICD-10-CM | POA: Diagnosis not present

## 2023-07-30 DIAGNOSIS — D492 Neoplasm of unspecified behavior of bone, soft tissue, and skin: Secondary | ICD-10-CM | POA: Diagnosis not present

## 2023-07-30 DIAGNOSIS — L814 Other melanin hyperpigmentation: Secondary | ICD-10-CM | POA: Diagnosis not present

## 2023-07-30 DIAGNOSIS — L821 Other seborrheic keratosis: Secondary | ICD-10-CM | POA: Diagnosis not present

## 2023-07-30 DIAGNOSIS — L570 Actinic keratosis: Secondary | ICD-10-CM | POA: Diagnosis not present

## 2023-07-30 DIAGNOSIS — Z85828 Personal history of other malignant neoplasm of skin: Secondary | ICD-10-CM | POA: Diagnosis not present

## 2023-08-04 ENCOUNTER — Other Ambulatory Visit: Payer: Self-pay | Admitting: Internal Medicine

## 2023-08-04 DIAGNOSIS — E1165 Type 2 diabetes mellitus with hyperglycemia: Secondary | ICD-10-CM

## 2023-08-05 ENCOUNTER — Encounter (HOSPITAL_COMMUNITY): Payer: Medicare HMO

## 2023-08-12 ENCOUNTER — Encounter (HOSPITAL_COMMUNITY): Payer: Medicare HMO | Admitting: Physical Therapy

## 2023-09-05 ENCOUNTER — Ambulatory Visit (INDEPENDENT_AMBULATORY_CARE_PROVIDER_SITE_OTHER): Payer: Medicare HMO | Admitting: Internal Medicine

## 2023-09-05 ENCOUNTER — Encounter: Payer: Self-pay | Admitting: Internal Medicine

## 2023-09-05 VITALS — BP 122/80 | HR 75 | Ht 68.0 in

## 2023-09-05 DIAGNOSIS — E042 Nontoxic multinodular goiter: Secondary | ICD-10-CM

## 2023-09-05 DIAGNOSIS — Z7984 Long term (current) use of oral hypoglycemic drugs: Secondary | ICD-10-CM

## 2023-09-05 DIAGNOSIS — Z8639 Personal history of other endocrine, nutritional and metabolic disease: Secondary | ICD-10-CM

## 2023-09-05 DIAGNOSIS — E1165 Type 2 diabetes mellitus with hyperglycemia: Secondary | ICD-10-CM

## 2023-09-05 LAB — POCT GLYCOSYLATED HEMOGLOBIN (HGB A1C): Hemoglobin A1C: 6.4 % — AB (ref 4.0–5.6)

## 2023-09-05 NOTE — Progress Notes (Signed)
Patient ID: CLINTONIA CARTAYA, female   DOB: September 05, 1956, 67 y.o.   MRN: [id number]  HPI: JAYLYN BYRNES is a 67 y.o.-year-old female, returning for follow-up for DM2, dx in 2019, non-insulin-dependent, controlled, without long-term complications. Pt. previously saw Dr. Everardo All, but last visit with me 4 months ago.  Interim history: No increased urination, blurry vision, nausea, chest pain.  She feels very tired - wakes up every am at 3 am. She previously lost 16 pounds due to severe diarrhea when using metformin.  Weight stabilized after stopping the medication. She had a fall recently - broke glasses, knocked her 2 front teeth out.  Reviewed HbA1c: Lab Results  Component Value Date   HGBA1C 6.5 (A) 05/06/2023   HGBA1C 6.1 (A) 11/05/2022   HGBA1C 6.3 (A) 05/02/2022   HGBA1C 5.9 (A) 02/05/2022   HGBA1C 6.5 (A) 08/02/2021   HGBA1C 6.4 (A) 01/10/2021   HGBA1C 6.2 (A) 07/11/2020   HGBA1C 6.3 (A) 01/06/2020   HGBA1C 6.6 (H) 10/28/2019   HGBA1C 6.3 (A) 07/07/2019   Previously on: - Metformin ER 1000 mg daily in am (dose decreased 07/2021), but stopped due to severe diarrhea  We changed to: - Jardiance 10 mg before breakfast -was forgetting many doses! -Now taking it more consistently  Pt does check her sugars 0 to once a day: - am: n/c >> 97-123 - 2h after b'fast: n/c - before lunch: n/c >> 124, 134 >> 121,148 >> n/c - 2h after lunch: n/c >> 105-145, 166 >> 106, 160 >> n/c - before dinner: n/c - 2h after dinner: n/c >> 100-129 - bedtime: n/c >> 100-120 >> n/c - nighttime: n/c  Glucometer: AccuChek Guide  - no CKD, last BUN/creatinine:  Lab Results  Component Value Date   BUN 7 03/08/2022   BUN 7 (L) 11/14/2021   CREATININE 0.87 03/08/2022   CREATININE 0.86 11/14/2021   Lab Results  Component Value Date   MICRALBCREAT <12 11/14/2021   MICRALBCREAT <6 01/20/2019  She is not on an ACE inhibitor/ARB.  - + HL; last set of lipids: Lab Results  Component Value Date    CHOL 149 11/14/2021   HDL 45 11/14/2021   LDLCALC 77 11/14/2021   TRIG 158 (H) 11/14/2021   CHOLHDL 3.3 11/14/2021  She is on Lipitor 10 mg daily.  - last eye exam was in 2023. No DR reportedly.  - no numbness and tingling in her feet.  Last foot exam 05/06/2023.  Multiple thyroid nodules: - dx'ed in ~1980s - at that time, a cyst was drained per review of Dr. George Hugh note  Thyroid U/S (03/22/2019): Parenchymal Echotexture: Mildly heterogenous  Isthmus: 0.4 cm  Right lobe: 6.5 cm x 5.1 cm x 5.2 cm  Left lobe: 5.1 cm x 2.3 cm x 2.0 cm  _________________________________________________________   Estimated total number of nodules >/= 1 cm: 3 _________________________________________________________   Right thyroid lesion is characterized based on recent CT imaging series, and not strictly TIRADS criteria. The lesion measures 5.5 cm x 4.0 cm x 4.7 cm within the mid right thyroid. Relatively heterogeneously hypoechoic internal tissue/fluid with a well-defined rim. No internal color flow. There is a focus centrally of slightly increased echogenicity, representing blood products and/or debris. The size is relatively unchanged from the CT dated 03/17/2019.   Nodule # 2:  Location: Left; Superior  Maximum size: 2.3 cm; Other 2 dimensions: 1.3 cm x 1.9 cm  Composition: solid/almost completely solid (2)  Echogenicity: hypoechoic (2) Echogenic foci: punctate echogenic foci (  3)  ACR TI-RADS total points: 7. ACR TI-RADS risk category: TR5 (>/= 7 points). Nodule meets criteria for biopsy. _________________________________________________________   Nodule # 3:  Location: Left; Inferior  Maximum size: 1.3 cm; Other 2 dimensions: 0.6 cm x 1.0 cm  Composition: cannot determine (2)  Echogenicity: hypoechoic (2) Nodule meets criteria for surveillance.  _________________________________________________________   Nodule # 4:  Location: Left; Inferior  Maximum size: 1.2 cm; Other 2  dimensions: 1.0 cm x 0.5 cm  Composition: cystic/almost completely cystic (0) Cystic nodule does not meet criteria for surveillance or biopsy.  _________________________________________________________   No adenopathy   IMPRESSION: Complex cystic lesion of the right thyroid measures 5.5 cm. Etiology may be inflammatory or infectious, less likely malignant. Percutaneous sampling may be considered.   Left upper thyroid nodule (labeled 2) meets criteria for biopsy, as designated by the newly established ACR TI-RADS criteria, and referral for biopsy is recommended.   Left lower thyroid nodule (labeled 3) meets criteria for surveillance, as designated by the newly established ACR TI-RADS criteria. Surveillance ultrasound study recommended to be performed annually up to 5 years.  FNA (03/28/2022): Adequacy Reason Satisfactory For Evaluation. Diagnosis THYROID, FINE NEEDLE ASPIRATION, LUP (SPECIMEN 1 OF 3, COLLECTED 03/29/19): SCANT FOLLICULAR EPITHELIUM PRESENT (BETHESDA CATEGORY I). Specimen Clinical Information TR4 lup nodule Source Thyroid, Fine Needle Aspiration, LUP, (Specimen 1 of 3, collected on 03/29/19)  Adequacy Reason Satisfactory For Evaluation. Diagnosis PAROTID GLAND, FINE NEEDLE ASPIRATION, RIGHT (SPECIMEN 2 OF 3 COLLECTED 03-29-2019) FEW ATYPICAL CELLS PRESENT. SEE COMMENT. COMMENT: THERE ARE SCATTERED SMALL GROUPS OF ATYPICAL CELLS PRESENT WHICH APPEAR TO BE SQUAMOUS CELLS. THE DIFFERENTIAL DIAGNOSIS IS BROAD, BUT A LOW GRADE MALIGNANCY CAN NOT BE ENTIRELY RULED OUT. TISSUE STUDIES MAY HELP BETTER EVALUATE THE EXTENT AND SEVERITY OF THE ATYPICAL CELLS. DR. CANACCI HAS REVIEWED THE CASE AND IS IN ESSENTIAL AGREEMENT WITH THIS INTERPRETATION. Pecola Leisure MD Pathologist, Electronic Signature (Case signed 04/01/2019) Specimen Clinical Information Right parotid mass Final Cytologic Interpretation   Right parotid gland, Fine Needle Aspiration II (smears and cell   block):       Findings consistent with Warthin's tumor.    Adequacy Reason Satisfactory For Evaluation. Diagnosis THYROID, FINE NEEDLE ASPIRATION RIGHT CYST (SPECIMEN 3 OF 3 COLLECTED 03/29/2019) FINDINGS CONSISTENT WITH THE CONTENTS OF A CYST. Pecola Leisure MD Pathologist, Electronic Signature (Case signed 03/30/2019) Specimen Clinical Information Enlarging cyst Source Thyroid, Fine Needle Aspiration, Right Cyst, (Specimen 3 of 3, collected on 03/29/19)  Thyroid U/S (07/24/2020): Parenchymal Echotexture: Moderately heterogenous  Isthmus: Normal in size measuring 0.5 cm in diameter, unchanged  Right lobe: Borderline enlarged measuring 5.5 x 1.8 x 1.8 cm, previously, 6.5 x 5.1 x 5.2 cm  Left lobe: Enlarged measures 6.0 x 2.0 x 1.9 cm, previously, 5.1 x 2.3 x 2.0 cm.  _________________________________________________________   Estimated total number of nodules >/= 1 cm: 5 _________________________________________________________   The previously identified approximately 5.5 x 4.7 x 3.9 cm hypoechoic complex cyst replacing the majority of the right lobe of the thyroid has nearly completely resolved in the interval with residual apparent cyst measuring approximately 1.1 x 0.5 x 0.5 cm.   There are several additional scattered punctate (sub 0.8 cm) hypoechoic nodules within right lobe of the thyroid, none of which meet imaging criteria to recommend percutaneous sampling or continued dedicated follow-up  _________________________________________________________   Nodule # 5:  Location: Left; Superior - this nodule was not definitely seen on the 03/2019 examination  Maximum size: 1.6 cm; Other 2 dimensions:  1.2 x 0.8 cm  Composition: solid/almost completely solid (2)  Echogenicity: hypoechoic (2) **Given size (>/= 1.5 cm) and appearance, fine needle aspiration of this moderately suspicious nodule should be considered based on TI-RADS criteria.   _________________________________________________________   The previously biopsied 1.9 x 1.9 x 1.1 cm hypoechoic nodule within the mid aspect of the left lobe of the thyroid (labeled 6), is unchanged to decreased in size compared to the 03/2019 examination, previously, 2.3 x 1.9 x 1.3 cm. Correlation with previous biopsy results is advised.  _________________________________________________________   Nodule # 7:  Prior biopsy: No  Location: Left; Mid  Maximum size: 1.2 cm; Other 2 dimensions: 1.0 x 0.6 cm, previously, 1.3 x 1.0 x 0.6 cm  Composition: cystic/almost completely cystic (0) Change in features: Yes now appears anechoic and cystic, previously, hypoechoic and indeterminate This nodule does NOT meet TI-RADS criteria for biopsy or dedicated follow-up.  _________________________________________________________   There is an approximately 1.0 x 0.9 x 0.8 cm minimally complex cyst within the inferior pole the left lobe thyroid labeled 8), not definitely seen on the 07/20 20 examination though does not meet criteria to recommend percutaneous sampling or continued dedicated follow-up.   IMPRESSION: 1. Interval development of an apparent new left-sided thyroid nodule (labeled #5) which meets imaging criteria to recommend percutaneous sampling as indicated. 2. Near complete resolution of previously aspirated right-sided thyroid cyst. 3. Previously biopsied left-sided thyroid nodule (labeled #6) is unchanged to decreased in size compared to the 03/2019 examination. Correlation with previous biopsy results is advised. Assuming a benign pathologic diagnosis, repeat sampling and/or continued dedicated follow-up is not recommended. 4. Additional left-sided thyroid nodule (labeled #7), previously meeting criteria to recommend surveillance, now appears anechoic and cystic and as such has been down graded from a TR4 nodule to a TR1 nodule and no longer meets imaging criteria to  recommend continued dedicated follow-up.  FNA (08/03/2020): Clinical History: Left; Superior - this nodule was not definitely seen  on the 03/2019 examination, 1.6 cm;  Other 2 dimensions: 1.2 x 0.8cm  solid/almost completely solid hypoechoic TI-RADS - 4  Specimen Submitted:  A. THYROID, LUP, FINE NEEDLE ASPIRATION:   FINAL MICROSCOPIC DIAGNOSIS:  - Consistent with benign follicular nodule (Bethesda category II)   SPECIMEN ADEQUACY:  Satisfactory for evaluation   Thyroid U/S (08/22/2021): Parenchymal Echotexture: Mildly heterogenous  Isthmus: 0.5 cm  Right lobe: 5.1 x 1.7 x 1.6 cm  Left lobe: 5.0 x 2.2 x 2.0 cm  _________________________________________________________   Estimated total number of nodules >/= 1 cm: 4 _________________________________________________________   Nodule # 5: The previously biopsied nodule in the left superior gland remains stable to slightly smaller at 1.4 x 0.8 x 0.8 cm compared to 1.6 x 1.2 x 0.8 cm previously.   Nodule # 6: Previously biopsied nodule in the left mid to upper gland is similar in size at 2.1 x 1.8 x 1.3 cm compared to 1.9 x 1.9 x 1.1 cm previously.   Numerous additional small cysts and nodules scattered throughout the thyroid gland. None of these meet criteria to warrant further evaluation or biopsy.   IMPRESSION: 1. Slow involution of previously biopsied nodule in the left superior gland. Involution over time is consistent with benignity. 2. Continued stability of previously biopsied nodule in the left mid gland. Recommend correlation with prior biopsy results. 3. Numerous additional small cysts and nodules bilaterally, none of which meet criteria to warrant further evaluation. 4. No new or suspicious nodules.   R lobe:  Sup. L lobe:   Thyroid U/S (12/02/2022): Parenchymal Echotexture: Moderately heterogenous  Isthmus: 0.5 cm  Right lobe: 5.0 cm x 2.0 cm x 1.8 cm  Left lobe: 5.3 cm x 1.9 cm x 2.2 cm   _________________________________________________________   Estimated total number of nodules >/= 1 cm: 4  _________________________________________________________   Nodule 1 right superior thyroid, 9 mm. Nodule does not meet criteria for surveillance.   Nodule labeled 2 mid right thyroid, 7 mm. Nodule does not meet criteria for surveillance.   Nodule labeled 3, inferior right thyroid, 8 mm. Nodule does not meet criteria for surveillance.   Nodule labeled 4, inferior right thyroid, favored to represent a pseudo nodule in a region of heterogeneous thyroid tissue and does not meet specific criteria for surveillance.   Nodule labeled 5, superior left thyroid 1.9 cm, previously 1.4 cm. This nodule has undergone prior biopsy 08/03/2020. Assuming benign result no further specific follow-up would be indicated.   Nodule labeled 6, left mid thyroid, 2.1 cm, unchanged. Nodule has undergone prior biopsy 03/29/2019 and assuming benign result no further specific follow-up would be indicated.   Nodule labeled 7, inferior left thyroid, 8 mm. This has decreased in size over time and now is below threshold for further surveillance.   Nodule labeled 8, inferior left thyroid, 1.4 cm, increased in size from the prior. Nodule meets criteria for surveillance.   No adenopathy   Recommendations follow those established by the new ACR TI-RADS criteria (J Am Coll Radiol 2017;14:587-595).   IMPRESSION: Multinodular thyroid as above.   Left inferior thyroid nodule (labeled 8, 1.4 cm) now meets criteria for surveillance, as designated by the newly established ACR TI-RADS criteria. Surveillance ultrasound study recommended to be performed annually up to 5 years.   Pt denies: - feeling nodules in neck - hoarseness - dysphagia She has choking with meat occasionally.  History of hyperthyroidism,: - latest TFTs have been normal: Lab Results  Component Value Date   TSH 1.08 11/05/2022   TSH  0.678 11/14/2021   TSH 1.15 07/11/2020   TSH 0.846 01/20/2019   TSH 0.709 05/06/2018   TSH 0.46 01/04/2016   FREET4 1.32 11/14/2021   FREET4 0.84 07/11/2020   FREET4 1.26 01/20/2019   FREET4 1.17 05/06/2018   + FH of thyroid ds. In son; no thyroid cancer. No h/o radiation tx to head or neck. No recent contrast studies. No herbal supplements. No Biotin use. No recent steroids use.   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   Panic attacks    Hx of   Thickened endometrium    Thyroid nodule 03/03/2019   Past Surgical History:  Procedure Laterality Date   CHOLECYSTECTOMY     CYSTOSCOPY  11/04/2019   Procedure: CYSTOSCOPY;  Surgeon: Shea Evans, MD;  Location: [facility];  Service: Gynecology;;   DILATION AND  CURETTAGE OF UTERUS N/A 06/16/2018   Procedure: DILATATION AND CURETTAGE;  Surgeon: Allie Bossier, MD;  Location: [facility];  Service: Gynecology;  Laterality: N/A;   ROBOTIC ASSISTED TOTAL HYSTERECTOMY WITH BILATERAL SALPINGO OOPHERECTOMY Bilateral 11/04/2019   Procedure: XI ROBOTIC ASSISTED TOTAL HYSTERECTOMY WITH BILATERAL SALPINGO OOPHORECTOMY/Pelvic Washings;  Surgeon: Shea Evans, MD;  Location: [facility];  Service: Gynecology;  Laterality: Bilateral;  Tracie to RNFA confirmed on 10/15/19 CS   TOE SURGERY     TUBAL LIGATION     US ECHOCARDIOGRAPHY  01-31-2009   EF 60%   Social  History   Socioeconomic History   Marital status: Divorced    Spouse name: Not on file   Number of children: 2   Years of education: 12   Highest education level: 12th grade  Occupational History   Occupation: Social Security Disability  Tobacco Use   Smoking status: Every Day    Current packs/day: 0.50    Average packs/day: 0.5 packs/day for 45.0 years (22.5 ttl pk-yrs)    Types: Cigarettes    Start date: 1980   Smokeless tobacco: Never  Vaping Use   Vaping status: Never Used  Substance and Sexual Activity   Alcohol use: No    Alcohol/week: 0.0 standard drinks of alcohol   Drug use: No   Sexual activity: Not Currently    Birth control/protection: Post-menopausal  Other Topics Concern   Not on file  Social History Narrative   Lives with on of her 3 sons, no grandchildren   Caffeine use: coffee and coke daily   Right-handed   Retired   Exercise - walks, mows, chops her wood   11/2021   Social Drivers of Health   Financial Resource Strain: Low Risk  (11/19/2022)   Overall Financial Resource Strain (CARDIA)    Difficulty of Paying Living Expenses: Not hard at all  Food Insecurity: No Food Insecurity (11/19/2022)   Hunger Vital Sign    Worried About Running Out of Food in the Last Year: Never true    Ran Out of Food in the Last Year: Never true  Transportation Needs: No Transportation Needs (11/19/2022)   PRAPARE - Administrator, Civil Service (Medical): No    Lack of Transportation (Non-Medical): No  Physical Activity: Inactive (11/19/2022)   Exercise Vital Sign    Days of Exercise per Week: 0 days    Minutes of Exercise per Session: 0 min  Stress: Stress Concern Present (11/19/2022)   [employer] of Occupational Health - Occupational Stress Questionnaire    Feeling of Stress : To some extent  Social Connections: Moderately Isolated (07/11/2020)   Social Connection and Isolation Panel [NHANES]    Frequency of Communication with Friends and Family: More  than three times a week    Frequency of Social Gatherings with Friends and Family: More than three times a week    Attends Religious Services: More than 4 times per year    Active Member of [employer] or Organizations: No    Attends Banker Meetings: Never    Marital Status: Divorced  Catering manager Violence: Not At Risk (07/11/2020)   Humiliation, Afraid, Rape, and Kick questionnaire    Fear of Current or Ex-Partner: No    Emotionally Abused: No    Physically Abused: No    Sexually Abused: No   Current Outpatient Medications on File Prior to Visit  Medication Sig Dispense Refill   Accu-Chek Softclix Lancets lancets USE AS  INSTRUCTED TO CHECK BLOOD SUGAR ONCE DAILY 100 each 3   atorvastatin (LIPITOR) 10 MG tablet TAKE 1 TABLET EVERY DAY 90 tablet 1   Blood Glucose Monitoring Suppl (ACCU-CHEK GUIDE) w/Device KIT Use as instructed to check blood sugar 1X daily 1 kit 0   cyanocobalamin (VITAMIN B12) 1000 MCG tablet Take 1 tablet (1,000 mcg total) by mouth daily. 90 tablet 1   diphenhydrAMINE-APAP, sleep, (ACETAMINOPHEN PM PO) Take by mouth.     glucose blood (ACCU-CHEK GUIDE) test strip USE AS INSTRUCTED TO CHECK BLOOD SUGAR ONCE DAILY 100 strip 3   JARDIANCE 10 MG TABS tablet TAKE 1 TABLET EVERY DAY BEFORE BREAKFAST 90 tablet 3   rivaroxaban (XARELTO) 10 MG TABS tablet Take 1 tablet (10 mg total) by mouth daily. 90 tablet 1   No current facility-administered medications on file prior to visit.   No Known Allergies Family History  Problem Relation Age of Onset   Diabetes Mother    Leukemia Father    Leukemia Sister    Diabetes Sister    Leukemia Brother    Multiple sclerosis Neg Hx    Autoimmune disease Neg Hx    Thyroid disease Neg Hx    PE: There were no vitals taken for this visit. Wt Readings from Last 3 Encounters:  06/25/23 192 lb (87.1 kg)  05/06/23 192 lb (87.1 kg)  05/06/23 192 lb (87.1 kg)   Constitutional: overweight, in NAD Eyes: no exophthalmos ENT:  no thyromegaly, no cervical lymphadenopathy Cardiovascular: RRR, No MRG Respiratory: CTA B Musculoskeletal: no deformities Skin: no rashes Neurological: + tremor with outstretched hands  ASSESSMENT: 1. DM2, non-insulin-dependent, controlled, without long-term complications  2.  Multiple thyroid nodules  3.  History of hypothyroidism  PLAN:  1. Patient with longstanding, uncontrolled, type 2 diabetes, on oral antidiabetic regimen, previously with metformin, but currently on Jardiance, after metformin was stopped due to severe diarrhea which was limiting her leaving the house.  On Jardiance, at last visit, HbA1c was at goal, at 6.5%, but higher than before.  She was mentioning that she was forgetting Jardiance and I advised her to set alarms on her phone to take it.  She mentions that she is now taking it consistently, every day. -At today's visit, sugars are all at goal.  We can continue the current regimen. - I suggested to:  Patient Instructions  Please continue: - Jardiance 10 mg daily in am  Please return in 4 months with your sugar log.   - we checked her HbA1c: 6.4% (slightly) - advised to check sugars at different times of the day - 1x a day, rotating check times - advised for yearly eye exams >> she is UTD and wants to schedule an appointment after the first of the year - I do not have records from PCP about recent labs and patient cannot remember when the last set of labs were done.  Will check them today. - return to clinic in 4-6 months   2.  Multiple thyroid nodules -Reviewed previous ultrasound report including the study from 12/02/2022.  She has a left inferior 1.4 cm nodule that increased in size and is now qualifies for yearly follow-up, otherwise, nodules were stable or decreased in size.  Of note, the dominant nodules in the left upper lobe were both biopsied with benign results in the past -No neck compression symptoms except occasional choking with dry foods, which  is chronic for her -Plan to repeat a new thyroid ultrasound in 2025  3.  History of hyperthyroidism -resolved -Latest TFTs were normal 10/2022 -No signs or symptoms of thyrotoxicosis at today's visit -Will continue to keep an eye on her TFTs-will check a TSH today  Carlus Pavlov, MD PhD [facility] Endocrinology

## 2023-09-05 NOTE — Patient Instructions (Addendum)
Please continue: - Jardiance 10 mg daily in am   Please return in 6 months with your sugar log.

## 2023-09-06 LAB — COMPLETE METABOLIC PANEL WITH GFR
AG Ratio: 1.5 (calc) (ref 1.0–2.5)
ALT: 11 U/L (ref 6–29)
AST: 10 U/L (ref 10–35)
Albumin: 4.1 g/dL (ref 3.6–5.1)
Alkaline phosphatase (APISO): 91 U/L (ref 37–153)
BUN: 8 mg/dL (ref 7–25)
CO2: 26 mmol/L (ref 20–32)
Calcium: 9.6 mg/dL (ref 8.6–10.4)
Chloride: 104 mmol/L (ref 98–110)
Creat: 0.82 mg/dL (ref 0.50–1.05)
Globulin: 2.8 g/dL (ref 1.9–3.7)
Glucose, Bld: 101 mg/dL — ABNORMAL HIGH (ref 65–99)
Potassium: 4.5 mmol/L (ref 3.5–5.3)
Sodium: 140 mmol/L (ref 135–146)
Total Bilirubin: 0.6 mg/dL (ref 0.2–1.2)
Total Protein: 6.9 g/dL (ref 6.1–8.1)
eGFR: 77 mL/min/{1.73_m2} (ref >=60)

## 2023-09-06 LAB — LIPID PANEL W/REFLEX DIRECT LDL
Cholesterol: 161 mg/dL (ref <200)
HDL: 48 mg/dL — ABNORMAL LOW (ref >=50)
LDL Cholesterol (Calc): 90 mg/dL
Non-HDL Cholesterol (Calc): 113 mg/dL (ref <130)
Total CHOL/HDL Ratio: 3.4 (calc) (ref <5.0)
Triglycerides: 136 mg/dL (ref <150)

## 2023-09-06 LAB — MICROALBUMIN / CREATININE URINE RATIO
Creatinine, Urine: 82 mg/dL (ref 20–275)
Microalb Creat Ratio: 4 mg/g{creat} (ref <30)
Microalb, Ur: 0.3 mg/dL

## 2023-09-06 LAB — TSH: TSH: 0.67 m[IU]/L (ref 0.40–4.50)

## 2023-09-23 DIAGNOSIS — L570 Actinic keratosis: Secondary | ICD-10-CM | POA: Diagnosis not present

## 2023-09-29 ENCOUNTER — Other Ambulatory Visit: Payer: Self-pay | Admitting: Medical

## 2023-10-14 ENCOUNTER — Telehealth: Payer: Self-pay | Admitting: Medical

## 2023-10-14 NOTE — Telephone Encounter (Signed)
Rhonda Hudson was given form,   Pt is scheduled for November for CPE

## 2023-10-14 NOTE — Telephone Encounter (Signed)
I received a request for handicap placard update.  I completed the form.  Make sure she is on the schedule for yearly physical

## 2023-10-25 ENCOUNTER — Other Ambulatory Visit: Payer: Self-pay | Admitting: Medical

## 2023-10-25 DIAGNOSIS — E7800 Pure hypercholesterolemia, unspecified: Secondary | ICD-10-CM

## 2023-10-28 ENCOUNTER — Other Ambulatory Visit: Payer: Self-pay | Admitting: Medical

## 2023-12-02 ENCOUNTER — Encounter (HOSPITAL_COMMUNITY): Payer: Self-pay | Admitting: *Deleted

## 2023-12-02 ENCOUNTER — Other Ambulatory Visit: Payer: Self-pay

## 2023-12-02 ENCOUNTER — Emergency Department (HOSPITAL_COMMUNITY)
Admission: EM | Admit: 2023-12-02 | Discharge: 2023-12-02 | Disposition: A | Discharge status: Home / Self Care | Attending: Emergency Medicine | Admitting: Emergency Medicine

## 2023-12-02 DIAGNOSIS — W548XXA Other contact with dog, initial encounter: Secondary | ICD-10-CM | POA: Diagnosis not present

## 2023-12-02 DIAGNOSIS — S61412A Laceration without foreign body of left hand, initial encounter: Secondary | ICD-10-CM | POA: Diagnosis not present

## 2023-12-02 DIAGNOSIS — Z7901 Long term (current) use of anticoagulants: Secondary | ICD-10-CM | POA: Diagnosis not present

## 2023-12-02 DIAGNOSIS — S6992XA Unspecified injury of left wrist, hand and finger(s), initial encounter: Secondary | ICD-10-CM | POA: Diagnosis present

## 2023-12-02 NOTE — ED Provider Notes (Signed)
 [facility] Provider Note   CSN: [id number] Arrival date & time: 12/02/23  2951     History  Chief Complaint  Patient presents with   Laceration    Rhonda Hudson is a 68 y.o. female.  She presents the ER for a laceration to the dorsum of the left hand.  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  Tetanus is up-to-date.  She states her small dog is unable to walk, she was carrying the dog up the stairs when she slipped and it scared the dog, the dog's claw caught her hand.  States this happened many times but it was slightly deeper than usual and she wanted to have the wound evaluated.  She denies significant pain, denies swelling.  Denies limited range of motion.  Did not fall, did not hit her head or have any other injuries.   Laceration      Home Medications Prior to Admission medications   Medication Sig Start Date End Date Taking? Authorizing Provider  Accu-Chek Softclix Lancets lancets USE AS INSTRUCTED TO CHECK BLOOD SUGAR ONCE DAILY 03/10/23   Carlus Pavlov, MD  atorvastatin (LIPITOR) 10 MG tablet TAKE 1 TABLET EVERY DAY 10/27/23   Tysinger, Kermit Balo, PA-C  Blood Glucose Monitoring Suppl (ACCU-CHEK GUIDE) w/Device KIT Use as instructed to check blood sugar 1X daily 05/15/22   Carlus Pavlov, MD  cyanocobalamin (VITAMIN B12) 1000 MCG tablet TAKE 1 TABLET EVERY DAY 10/28/23   Tysinger, Kermit Balo, PA-C  diphenhydrAMINE-APAP, sleep, (ACETAMINOPHEN PM PO) Take by mouth.    [provider]  glucose blood (ACCU-CHEK GUIDE) test strip USE AS INSTRUCTED TO CHECK BLOOD SUGAR ONCE DAILY 03/10/23   Carlus Pavlov, MD  JARDIANCE 10 MG TABS tablet TAKE 1 TABLET EVERY DAY BEFORE BREAKFAST 08/04/23   Carlus Pavlov, MD  rivaroxaban (XARELTO) 10 MG TABS tablet TAKE 1 TABLET EVERY DAY 09/29/23   Tysinger, Kermit Balo, PA-C       Allergies    Patient has no known allergies.    Review of Systems   Review of Systems  Physical Exam Updated Vital Signs BP 107/68   Pulse 92   Temp 99 F (37.2 C) (Oral)   Resp 16   Ht 5\' 8"  (1.727 m)   Wt 85.3 kg   SpO2 95%   BMI 28.59 kg/m  Physical Exam Vitals and nursing note reviewed.  Constitutional:      General: She is not in acute distress.    Appearance: She is well-developed.  HENT:     Head: Normocephalic and atraumatic.     Mouth/Throat:     Mouth: Mucous membranes are moist.  Eyes:     Conjunctiva/sclera: Conjunctivae normal.  Cardiovascular:     Rate and Rhythm: Normal rate and regular rhythm.     Heart sounds: No murmur heard. Pulmonary:     Effort: Pulmonary effort is normal. No respiratory distress.     Breath sounds: Normal breath sounds.  Abdominal:     Palpations: Abdomen is soft.     Tenderness: There is no abdominal tenderness.  Musculoskeletal:        General: No swelling or tenderness.     Cervical back: Neck supple.     Comments: Normal range of motion left wrist without swelling or bony tenderness  Skin:    General: Skin is warm and dry.  Capillary Refill: Capillary refill takes less than 2 seconds.     Comments: Superficial laceration noted to the dorsum of the right hand approximately 6 cm in length and jagged with several V shaped skin tears  Neurological:     General: No focal deficit present.     Mental Status: She is alert and oriented to person, place, and time.  Psychiatric:        Mood and Affect: Mood normal.     ED Results / Procedures / Treatments   Labs (all labs ordered are listed, but only abnormal results are displayed) Labs Reviewed - No data to display  EKG None  Radiology No results found.  Procedures .Laceration Repair  Date/Time: 12/02/2023 8:14 PM  Performed by: Ma Rings, PA-C Authorized by: Ma Rings, PA-C   Consent:    Consent obtained:  Verbal   Consent given by:   Patient   Risks discussed:  Infection, pain and retained foreign body Universal protocol:    Patient identity confirmed:  Verbally with patient Anesthesia:    Anesthesia method:  None Laceration details:    Location:  Hand   Hand location:  L hand, dorsum   Length (cm):  6 Exploration:    Hemostasis achieved with:  Direct pressure   Wound exploration: wound explored through full range of motion     Contaminated: no   Treatment:    Area cleansed with:  Soap and water and saline   Amount of cleaning:  Extensive   Irrigation solution:  Sterile saline   Irrigation method:  Syringe Skin repair:    Repair method:  Steri-Strips   Number of Steri-Strips:  12 Approximation:    Approximation:  Close Repair type:    Repair type:  Simple Post-procedure details:    Dressing: gauze and ace wrap.   Procedure completion:  Tolerated     Medications Ordered in ED Medications - No data to display  ED Course/ Medical Decision Making/ A&P                                 Medical Decision Making Differential diagnosis includes but not limited to laceration, foreign body, fracture, other  ED course: Patient here for a laceration to the dorsum of the left hand, this is very superficial, it was cleaned and approximated with Steri-Strips, though wound was covered with gauze and an Ace wrap to protect her hand from further injury.  Advised on wound care follow-up and strict return precautions.           Final Clinical Impression(s) / ED Diagnoses Final diagnoses:  None    Rx / DC Orders ED Discharge Orders     None         Josem Kaufmann 12/02/23 2113    Anders Simmonds T, DO 12/04/23 2317

## 2023-12-02 NOTE — ED Notes (Signed)
 Telfa applied over steri strips and secured with and ace wrap.

## 2023-12-02 NOTE — Discharge Instructions (Signed)
 In the ER today for a cut on your hand.  Keep it clean and dry, the Steri-Strips will peel off gradually over about a week.  If you develop swelling, redness or drainage or have a fever come back.  These are signs of infection.  At this time I do not feel you need antibiotics.  Come back to the ER if you have new or worsening symptoms,  otherwise follow-up with your primary care doctor.

## 2023-12-02 NOTE — ED Triage Notes (Signed)
 Pt was carrying her paralyzed dog up the stairs and got scared, pt states his claw got her left wrist causing a laceration,  pt has clotting disorder. Tetanus shot within last 5 years and states the dog is up to date on his shots.

## 2023-12-05 ENCOUNTER — Telehealth: Payer: Self-pay | Admitting: *Deleted

## 2023-12-05 ENCOUNTER — Ambulatory Visit: Payer: Self-pay

## 2023-12-05 NOTE — Telephone Encounter (Signed)
 Called patient and left message that I was trying to get her worked in today, had to leave a message.

## 2023-12-05 NOTE — Telephone Encounter (Signed)
 Left message for pt to call me back. Trying to work patient in the schedule today

## 2023-12-05 NOTE — Telephone Encounter (Signed)
 Chief Complaint: Puncture wound in hand - draining brown fluid Symptoms: above Frequency: today - injury was 12/02/2023 Pertinent Negatives: Patient denies redness. Swelling warmth Disposition: [] ED /[x] Urgent Care (no appt availability in office) / [] Appointment(In office/virtual)/ []  [facility]/ [] Home Care/ [x] Refused Recommended Disposition /[] [facility]/ []  Follow-up with PCP Additional Notes: Pt  was seen in ED for hand injury. Her dog's toenail punctured her hand. Pt now noticed brown fluid draining from hand. No other s/s. No appts availble. Called [facility] s/w [facility] no appts availble for today. Made appt for Monday. Advised UC for today. Pt will also be first on wait list for today. Pt refuses UC, she will monitor at home for now.      Summary: Potential infection in hand, seeking appt today. Hosp fu   Copied From CRM [phone]. Reason for Triage: Pt needs to schedule a hosp fu, has potential infection in her hand. She declined the next available and is asking to be worked in today.  Best contact: [phone]     Reason for Disposition  [1] After 3 days AND [2] pain not improving  [1] Pus or cloudy fluid draining from wound AND [2] no fever  Answer Assessment - Initial Assessment Questions 1. MECHANISM: "How did the injury happen?"     Dog's toenail went into hand 2. ONSET: "When did the injury happen?" (Minutes or hours ago)      12/02/2023 3. APPEARANCE of INJURY: "What does the injury look like?"      Pt has hole in had with brown liquid draining 4. SEVERITY: "Can you use the hand normally?" "Can you bend your fingers into a ball and then fully open them?"     yes 5. SIZE: For cuts, bruises, or swelling, ask: "How large is it?" (e.g., inches or centimeters;  entire hand or wrist)      small 6. PAIN: "Is there pain?" If Yes, ask: "How bad is the pain?"  (Scale 1-10; or mild, moderate, severe)     mild 7. TETANUS: For any breaks in the skin, ask:  "When was the last tetanus booster?"     na 8. OTHER SYMPTOMS: "Do you have any other symptoms?"      no  Answer Assessment - Initial Assessment Questions 1. LOCATION: "Where is the wound located?"      hand 2. WOUND APPEARANCE: "What does the wound look like?"      Hole with brown liquid coming out 3. SIZE: If redness is present, ask: "What is the size of the red area?" (Inches, centimeters, or compare to size of a coin)      Small toe nail hole 4. SPREAD: "What's changed in the last day?"  "Do you see any red streaks coming from the wound?"     no 5. ONSET: "When did it start to look infected?"      Unsure if infected 6. MECHANISM: "How did the wound start, what was the cause?"     Puncture by dog's toenail 7. PAIN: Do you have any pain?"  If Yes, ask: "How bad is the pain?"  (e.g., Scale 1-10; mild, moderate, or severe)    - MILD (1-3): Doesn't interfere with normal activities.     - MODERATE (4-7): Interferes with normal activities or awakens from sleep.    - SEVERE (8-10): Excruciating pain, unable to do any normal activities.       mild 8. FEVER: "Do you have a fever?" If Yes, ask: "What  is your temperature, how was it measured, and when did it start?"     no 9. OTHER SYMPTOMS: "Do you have any other symptoms?" (e.g., shaking chills, weakness, rash elsewhere on body)     no  Protocols used: Hand and Wrist Injury-A-AH, Wound Infection Suspected-A-AH

## 2023-12-08 ENCOUNTER — Ambulatory Visit: Admitting: Medical

## 2023-12-10 DIAGNOSIS — H524 Presbyopia: Secondary | ICD-10-CM | POA: Diagnosis not present

## 2023-12-10 DIAGNOSIS — E119 Type 2 diabetes mellitus without complications: Secondary | ICD-10-CM | POA: Diagnosis not present

## 2023-12-10 DIAGNOSIS — H43811 Vitreous degeneration, right eye: Secondary | ICD-10-CM | POA: Diagnosis not present

## 2023-12-10 DIAGNOSIS — H18513 Endothelial corneal dystrophy, bilateral: Secondary | ICD-10-CM | POA: Diagnosis not present

## 2023-12-10 LAB — HM DIABETES EYE EXAM

## 2023-12-11 ENCOUNTER — Encounter: Payer: Self-pay | Admitting: Medical

## 2023-12-29 ENCOUNTER — Other Ambulatory Visit: Payer: Self-pay | Admitting: Internal Medicine

## 2023-12-29 DIAGNOSIS — E1165 Type 2 diabetes mellitus with hyperglycemia: Secondary | ICD-10-CM

## 2024-01-20 ENCOUNTER — Other Ambulatory Visit: Payer: Self-pay | Admitting: Medical

## 2024-01-20 ENCOUNTER — Ambulatory Visit (INDEPENDENT_AMBULATORY_CARE_PROVIDER_SITE_OTHER): Admitting: Medical

## 2024-01-20 ENCOUNTER — Encounter: Payer: Self-pay | Admitting: Medical

## 2024-01-20 VITALS — BP 112/64 | HR 86 | Ht 68.0 in | Wt 188.0 lb

## 2024-01-20 DIAGNOSIS — Z780 Asymptomatic menopausal state: Secondary | ICD-10-CM | POA: Insufficient documentation

## 2024-01-20 DIAGNOSIS — Z2820 Immunization not carried out because of patient decision for unspecified reason: Secondary | ICD-10-CM | POA: Diagnosis not present

## 2024-01-20 DIAGNOSIS — N3281 Overactive bladder: Secondary | ICD-10-CM

## 2024-01-20 DIAGNOSIS — Z5320 Procedure and treatment not carried out because of patient's decision for unspecified reasons: Secondary | ICD-10-CM | POA: Diagnosis not present

## 2024-01-20 DIAGNOSIS — E118 Type 2 diabetes mellitus with unspecified complications: Secondary | ICD-10-CM | POA: Diagnosis not present

## 2024-01-20 DIAGNOSIS — E538 Deficiency of other specified B group vitamins: Secondary | ICD-10-CM | POA: Diagnosis not present

## 2024-01-20 DIAGNOSIS — Z0000 Encounter for general adult medical examination without abnormal findings: Secondary | ICD-10-CM

## 2024-01-20 DIAGNOSIS — Z7901 Long term (current) use of anticoagulants: Secondary | ICD-10-CM | POA: Diagnosis not present

## 2024-01-20 DIAGNOSIS — F17200 Nicotine dependence, unspecified, uncomplicated: Secondary | ICD-10-CM | POA: Diagnosis not present

## 2024-01-20 LAB — POCT URINALYSIS DIP (PROADVANTAGE DEVICE)
Bilirubin, UA: NEGATIVE
Blood, UA: NEGATIVE
Glucose, UA: >=1000 mg/dL — AB
Ketones, POC UA: NEGATIVE mg/dL
Leukocytes, UA: NEGATIVE
Nitrite, UA: NEGATIVE
Protein Ur, POC: NEGATIVE mg/dL
Specific Gravity, Urine: 1.005
Urobilinogen, Ur: 0.2
pH, UA: 6 (ref 5.0–8.0)

## 2024-01-20 MED ORDER — CYANOCOBALAMIN 1000 MCG/ML IJ SOLN
1000.0000 ug | Freq: Once | INTRAMUSCULAR | Status: AC
  Administered 2024-01-20: 1000 ug via INTRAMUSCULAR

## 2024-01-20 NOTE — Progress Notes (Signed)
 Subjective: Chief Complaint  Patient presents with   Annual Exam    Nonfasting annual exam. She has been having low back pain B/L x 1 week (did get urine for CPE). No other concerns.     Here for well visit  Medical team: Dr. Zula Hitch, ophthalmology Dr. Ilean Mall, endocrinology Dr. Gaines Joy, dermatology Dr. Bridget Campion, orthopedic Dr. Hoyt Macleod, GI Sees dentist   Concerns: Here for well visit.  Of note she declines mammogram, vaccines, other screening recommendations  She sees endocrinology, compliant with Jardiance .  History of DVT-compliant with Xarelto   Hyperlipidemia-compliant with Lipitor atorvastatin  10 mg daily  History of B12 deficiency-has not had a B12 injection in a while but does do over-the-counter vitamin B12 supplement  She has a history of chronic back pain.  She has seen orthopedics and has had prior treatments but still has ongoing back pains.  In the remote past she saw Dr. Isla Mari at [facility] urology.  At 1 point she was told she had a cyst of the kidney  She smokes regularly and has no desire to quit.  She does not drink any alcohol.   Reviewed their medical, surgical, family, social, medication, and allergy history and updated chart as appropriate.  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   Panic attacks    Hx of   Thickened endometrium    Thyroid  nodule 03/03/2019     Current Outpatient Medications on File Prior to Visit  Medication Sig Dispense Refill   ACCU-CHEK GUIDE TEST test strip USE AS INSTRUCTED TO CHECK BLOOD SUGAR ONCE DAILY 100 strip 3   Accu-Chek Softclix Lancets lancets USE AS INSTRUCTED TO CHECK BLOOD SUGAR ONCE DAILY 100 each 3   atorvastatin  (LIPITOR) 10 MG tablet TAKE 1 TABLET EVERY DAY 90 tablet 1   Blood Glucose Monitoring Suppl (ACCU-CHEK GUIDE) w/Device KIT Use as instructed to check blood sugar 1X daily 1 kit 0   cyanocobalamin  (VITAMIN B12) 1000 MCG tablet TAKE 1 TABLET EVERY DAY 90 tablet 1   diphenhydrAMINE-APAP, sleep, (ACETAMINOPHEN  PM PO) Take by mouth.     JARDIANCE  10 MG TABS tablet TAKE 1 TABLET EVERY DAY BEFORE BREAKFAST 90 tablet 3   rivaroxaban  (XARELTO ) 10 MG TABS tablet TAKE 1 TABLET EVERY DAY 90 tablet 1   No current facility-administered medications on file prior to visit.      Current Outpatient Medications:    ACCU-CHEK GUIDE TEST test strip, USE AS INSTRUCTED TO CHECK BLOOD SUGAR ONCE DAILY, Disp: 100 strip, Rfl: 3   Accu-Chek Softclix Lancets lancets, USE AS INSTRUCTED TO CHECK BLOOD SUGAR ONCE DAILY, Disp: 100 each, Rfl: 3   atorvastatin  (LIPITOR) 10 MG tablet, TAKE 1 TABLET EVERY DAY, Disp: 90 tablet, Rfl: 1   Blood Glucose Monitoring Suppl (ACCU-CHEK GUIDE) w/Device KIT, Use as instructed to check blood sugar 1X daily, Disp: 1 kit,  Rfl: 0   cyanocobalamin  (VITAMIN B12) 1000 MCG tablet, TAKE 1 TABLET EVERY DAY, Disp: 90 tablet, Rfl: 1   diphenhydrAMINE-APAP, sleep, (ACETAMINOPHEN  PM PO), Take by mouth., Disp: , Rfl:    JARDIANCE  10 MG TABS tablet, TAKE 1 TABLET EVERY DAY BEFORE BREAKFAST, Disp: 90 tablet, Rfl: 3   rivaroxaban  (XARELTO ) 10 MG TABS tablet, TAKE 1 TABLET EVERY DAY, Disp: 90 tablet, Rfl: 1  Family History  Problem Relation Age of Onset   Diabetes Mother    Leukemia Father    Leukemia Sister    Diabetes Sister    Leukemia Brother    Multiple sclerosis Neg Hx    Autoimmune disease Neg  Hx    Thyroid  disease Neg Hx     Past Surgical History:  Procedure Laterality Date   CHOLECYSTECTOMY     CYSTOSCOPY  11/04/2019   Procedure: CYSTOSCOPY;  Surgeon: Terri Fester, MD;  Location: [facility];  Service: Gynecology;;   DILATION AND CURETTAGE OF UTERUS N/A 06/16/2018   Procedure: DILATATION AND CURETTAGE;  Surgeon: Ana Balling, MD;  Location: [facility];  Service: Gynecology;  Laterality: N/A;   ROBOTIC ASSISTED TOTAL HYSTERECTOMY WITH BILATERAL SALPINGO OOPHERECTOMY Bilateral 11/04/2019   Procedure: XI ROBOTIC ASSISTED TOTAL HYSTERECTOMY WITH BILATERAL SALPINGO OOPHORECTOMY/Pelvic Washings;  Surgeon: Terri Fester, MD;  Location: [facility];  Service: Gynecology;  Laterality: Bilateral;  Tracie to RNFA confirmed on 10/15/19 CS   TOE SURGERY     TUBAL LIGATION     US  ECHOCARDIOGRAPHY  01-31-2009   EF 60%      Review of Systems  Constitutional:  Negative for chills, fever, malaise/fatigue and weight loss.  HENT:  Negative for congestion, ear pain, hearing loss, sore throat and tinnitus.   Eyes:  Negative for blurred vision, pain and redness.  Respiratory:  Negative for cough, hemoptysis and shortness of breath.   Cardiovascular:  Negative for chest pain, palpitations, orthopnea, claudication and leg swelling.  Gastrointestinal:  Negative for abdominal pain, blood in stool, constipation, diarrhea, nausea and vomiting.  Genitourinary:  Negative for dysuria, flank pain, frequency, hematuria and urgency.  Musculoskeletal:  Positive for back pain and myalgias. Negative for falls and joint pain.  Skin:  Negative for itching and rash.  Neurological:  Negative for dizziness, tingling, speech change, weakness and headaches.  Endo/Heme/Allergies:  Negative for polydipsia. Does not bruise/bleed easily.  Psychiatric/Behavioral:  Negative for depression and memory loss. The patient is not nervous/anxious and does not have insomnia.          Objective:  BP 112/64   Pulse 86   Ht 5\' 8"  (1.727 m)   Wt 188 lb (85.3 kg) Comment: refused-per patient  SpO2 98%   BMI 28.59 kg/m   General appearance: alert, no distress, WD/WN, Caucasian female Skin: She has a scab of the dorsal hand bilaterally from recent abrasion.  No other specific worrisome lesions. HEENT: normocephalic, conjunctiva/corneas normal, sclerae anicteric, PERRLA, EOMi, nares patent, no discharge or erythema, pharynx normal Oral cavity: MMM, tongue normal, teeth normal Neck: supple, no lymphadenopathy, no thyromegaly, no masses, normal ROM, no bruits Chest: non tender, normal shape and expansion Heart: RRR, normal S1, S2, no murmurs Lungs: CTA bilaterally, no wheezes, rhonchi, or rales Abdomen: +bs, soft, non tender, non distended, no masses, no hepatomegaly, no splenomegaly, no bruits Back: Tender lumbar spine with somewhat reduced range of motion due to pain, otherwise non tender,no scoliosis Musculoskeletal: upper extremities non tender, no obvious deformity, normal ROM  throughout, lower extremities non tender, no obvious deformity, normal ROM throughout Extremities: no edema, no cyanosis, no clubbing Pulses: 2+ symmetric, upper and lower extremities, normal cap refill Neurological: alert, oriented x 3, CN2-12 intact, strength normal upper extremities and lower extremities, sensation normal throughout, DTRs 2+ throughout, no cerebellar signs, gait normal Psychiatric: normal affect, behavior normal, pleasant  Breast/gyn/rectal - deferred to gynecology     Assessment and Plan :   Encounter Diagnoses  Name Primary?   Annual physical exam Yes   Encounter for health maintenance examination in adult    Type II diabetes mellitus with complication (HCC)    Smoker    Chronic anticoagulation    OAB (overactive bladder)    B12 deficiency    Postmenopausal estrogen deficiency    Vaccine refused by patient    Mammogram declined      This visit was a  preventative care visit, also known as wellness visit or routine physical.   Topics typically include healthy lifestyle, diet, exercise, preventative care, vaccinations, sick and well care, proper use of emergency dept and after hours care, as well as other concerns.    Separate significant issues discussed: History of B12 deficiency-updated B12 injection today.  Labs today.  Continue oral supplement  Tobacco use-no desire to quit.  I recommended lung cancer screening.  She refuses at this time.  Chronic anticoagulation on Xarelto  without complaint  Diabetes-updated labs today.  Follow-up with endocrinologist as planned.  Continue Jardiance  10 mg daily   General Recommendations: Continue to return yearly for your annual wellness and preventative care visits.  This gives us  a chance to discuss healthy lifestyle, exercise, vaccinations, review your chart record, and perform screenings where appropriate.  I recommend you see your eye doctor yearly for routine vision care.  I recommend you see your dentist yearly for routine dental care including hygiene visits twice yearly.   Vaccination recommendations were reviewed Immunization History  Administered Date(s) Administered   Fluad Quad(high Dose 65+) 05/12/2019, 06/21/2020, 06/27/2021, 06/11/2022   Fluad Trivalent(High Dose 65+) 07/03/2023   Influenza,inj,Quad PF,6+ Mos 05/30/2016, 05/14/2017   Pneumococcal Polysaccharide-23 10/27/2019   Tdap 01/21/2017    Vaccine recommendations: Shingrix, pneumococcal but she declines   Screening for cancer: Colon cancer screening: Prior or last colon cancer screen: 03/2022, repeat in 10 years  Breast cancer screening: You should perform a self breast exam monthly.   We reviewed recommendations for regular mammograms and breast cancer screening. Last mammogram: advised yearly but she declines  Cervical cancer screening: We reviewed recommendations for pap smear screening. Last pap s/p  hysterectomy and age > 65yo   Skin cancer screening: Check your skin regularly for new changes, growing lesions, or other lesions of concern Come in for evaluation if you have skin lesions of concern.  Lung cancer screening: If you have a greater than 20 pack year history of tobacco use, then you may qualify for lung cancer screening with a chest CT scan.   Please call your insurance company to inquire about coverage for this test. She declines  Pancreatic cancer: no current screening test is available routinely recommended.  (Risk factors: Smoking, overweight or obese, diabetes, chronic pancreatitis, work Nurse, mental health, Solicitor, 78 year old or greater, female greater than female, African-American, family history of pancreatic cancer, hereditary breast, ovarian, melanoma, Lynch, Peutz-jeghers).  We currently don't have screenings for other cancers besides breast, cervical, colon, and lung cancers.  If you have a strong family history of cancer or have other cancer screening concerns, please  let me know.    Bone health: Get at least 150 minutes of aerobic exercise weekly Get weight bearing exercise at least once weekly Bone density test:  A bone density test is an imaging test that uses a type of X-ray to measure the amount of calcium  and other minerals in your bones. The test may be used to diagnose or screen you for a condition that causes weak or thin bones (osteoporosis), predict your risk for a broken bone (fracture), or determine how well your osteoporosis treatment is working. The bone density test is recommended for females 65 and older, or females or males <65 if certain risk factors such as thyroid  disease, long term use of steroids such as for asthma or rheumatological issues, vitamin D deficiency, estrogen deficiency, family history of osteoporosis, self or family history of fragility fracture in first degree relative.  Consider baseline bone density test.    Please  call to schedule your bone density test.   [facility]  [phone] [address], [address] [place], [place] [address]    Heart health: Get at least 150 minutes of aerobic exercise weekly Limit alcohol It is important to maintain a healthy blood pressure and healthy cholesterol numbers  Heart disease screening: Screening for heart disease includes screening for blood pressure, fasting lipids, glucose/diabetes screening, BMI height to weight ratio, reviewed of smoking status, physical activity, and diet.    Goals include blood pressure 120/80 or less, maintaining a healthy lipid/cholesterol profile, preventing diabetes or keeping diabetes numbers under good control, not smoking or using tobacco products, exercising most days per week or at least 150 minutes per week of exercise, and eating healthy variety of fruits and vegetables, healthy oils, and avoiding unhealthy food choices like fried food, fast food, high sugar and high cholesterol foods.    Other tests may possibly include EKG test, CT coronary calcium  score, echocardiogram, exercise treadmill stress test.   Consider CT coronary heart test, $95 cash pay.   Vascular disease screening: For high risk individuals including smokers, diabetes, patients with known heart disease or high blood pressure, kidney disease, and others, screening for vascular disease or atherosclerosis of the arteries is available.  Examples may include carotid ultrasound, abdominal aortic ultrasound, ABI blood flow screening in the legs, thoracic aorta screening.  Consider ABI blood flow screening in the legs.   Medical care options: I recommend you continue to seek care here first for routine care.  We try really hard to have available appointments Monday through Friday daytime hours for sick visits, acute visits, and physicals.  Urgent care should be used for after hours and weekends for significant issues that cannot wait  till the next day.  The emergency department should be used for significant potentially life-threatening emergencies.  The emergency department is expensive, can often have long wait times for less significant concerns, so try to utilize primary care, urgent care, or telemedicine when possible to avoid unnecessary trips to the emergency department.  Virtual visits and telemedicine have been introduced since the pandemic started in 2020, and can be convenient ways to receive medical care.  We offer virtual appointments as well to assist you in a variety of options to seek medical care.   Legal Take the time to do a Last Will and Testament, advanced directives including Healthcare Power of Attorney and Living Will documents.  Do not leave your family with burdens that can be handled ahead of time.   Advanced Directives:  I recommend you consider completing a Health Care Power of Attorney and Living Will.   These documents respect your wishes and help alleviate burdens on your loved ones if you were to become terminally ill or be in a position to need those documents enforced.    You can complete Advanced Directives yourself, have them notarized, then have copies made for our office, for you and for anybody you feel should have them in safe keeping.  Or, you can have an attorney prepare these documents.   If you haven't updated your Last Will and Testament in a while, it may be worthwhile having an attorney prepare these documents together and save on some costs.       Spiritual and Emotional Health Keeping a healthy spiritual life can help you better manage your physical health. Your spiritual life can help you to cope with any issues that may arise with your physical health.  Balance can keep us  healthy and help us  to recover.  If you are struggling with your spiritual health there are questions that you may want to ask yourself:  What makes me feel most complete? When do I feel most connected to  the rest of the world? Where do I find the most inner strength? What am I doing when I feel whole?  Helpful tips: Being in nature. Some people feel very connected and at peace when they are walking outdoors or are outside. Helping others. Some feel the largest sense of wellbeing when they are of service to others. Being of service can take on many forms. It can be doing volunteer work, being kind to strangers, or offering a hand to a friend in need. Gratitude. Some people find they feel the most connected when they remain grateful. They may make lists of all the things they are grateful for or say a thank you out loud for all they have.    Emotional Health Are you in tune with your emotional health?  Check out this link: http://www.marquez-love.com/   Financial Health Make sure you use a budget for your personal finances Make sure you are insured against risks (health insurance, life insurance, auto insurance, etc) Save more, spend less Set financial goals If you need help in this area, good resources include counseling through [employer] or other community resources, have a meeting with a Social research officer, government, and a good resource is Tomas Fountain podcast    Rhonda "Gregary Lean" was seen today for annual exam.  Diagnoses and all orders for this visit:  Annual physical exam -     POCT Urinalysis DIP (Proadvantage Device)  Encounter for health maintenance examination in adult -     Hemoglobin A1c -     Vitamin B12 -     CBC with Differential/Platelet  Type II diabetes mellitus with complication (HCC) -     Hemoglobin A1c  Smoker  Chronic anticoagulation  OAB (overactive bladder)  B12 deficiency -     Vitamin B12 -     cyanocobalamin  (VITAMIN B12) injection 1,000 mcg  Postmenopausal estrogen deficiency -     Cancel: DG Bone Density; Future  Vaccine refused by patient  Mammogram declined     Follow-up pending labs, yearly for  physical

## 2024-01-21 ENCOUNTER — Other Ambulatory Visit: Payer: Self-pay | Admitting: Medical

## 2024-01-21 ENCOUNTER — Telehealth: Payer: Self-pay | Admitting: Medical

## 2024-01-21 DIAGNOSIS — F17200 Nicotine dependence, unspecified, uncomplicated: Secondary | ICD-10-CM

## 2024-01-21 DIAGNOSIS — Z122 Encounter for screening for malignant neoplasm of respiratory organs: Secondary | ICD-10-CM

## 2024-01-21 LAB — CBC WITH DIFFERENTIAL/PLATELET
Basophils Absolute: 0.1 10*3/uL (ref 0.0–0.2)
Basos: 1 %
EOS (ABSOLUTE): 0.1 10*3/uL (ref 0.0–0.4)
Eos: 1 %
Hematocrit: 46.5 % (ref 34.0–46.6)
Hemoglobin: 15.3 g/dL (ref 11.1–15.9)
Immature Grans (Abs): 0 10*3/uL (ref 0.0–0.1)
Immature Granulocytes: 0 %
Lymphocytes Absolute: 2.9 10*3/uL (ref 0.7–3.1)
Lymphs: 31 %
MCH: 29.1 pg (ref 26.6–33.0)
MCHC: 32.9 g/dL (ref 31.5–35.7)
MCV: 89 fL (ref 79–97)
Monocytes Absolute: 0.5 10*3/uL (ref 0.1–0.9)
Monocytes: 5 %
Neutrophils Absolute: 5.9 10*3/uL (ref 1.4–7.0)
Neutrophils: 62 %
Platelets: 190 10*3/uL (ref 150–450)
RBC: 5.25 x10E6/uL (ref 3.77–5.28)
RDW: 13.1 % (ref 11.7–15.4)
WBC: 9.5 10*3/uL (ref 3.4–10.8)

## 2024-01-21 LAB — HEMOGLOBIN A1C
Est. average glucose Bld gHb Est-mCnc: 146 mg/dL
Hgb A1c MFr Bld: 6.7 % — ABNORMAL HIGH (ref 4.8–5.6)

## 2024-01-21 LAB — VITAMIN B12: Vitamin B-12: 775 pg/mL (ref 232–1245)

## 2024-01-21 MED ORDER — RIVAROXABAN 10 MG PO TABS: 10.0000 mg | ORAL_TABLET | Freq: Every day | ORAL | 3 refills | Status: AC

## 2024-01-21 NOTE — Progress Notes (Signed)
 Results sent through MyChart

## 2024-01-21 NOTE — Telephone Encounter (Signed)
 Copied from CRM [id number]. Topic: Referral - Request for Referral >> Jan 21, 2024  8:44 AM Rosamond Comes wrote: Patient calling in requesting referral for lung cancer screening Did the patient discuss referral with their provider in the last year? Yes (If No - schedule appointment) (If Yes - send message)  Appointment offered? No  Type of order/referral and detailed reason for visit: lung cancer screening  Preference of office, provider, location: no  If referral order, have you been seen by this specialty before? No (If Yes, this issue or another issue? When? Where?  Can we respond through MyChart? No

## 2024-01-27 ENCOUNTER — Ambulatory Visit: Payer: Self-pay | Admitting: Internal Medicine

## 2024-01-27 NOTE — Telephone Encounter (Unsigned)
 Copied from CRM [id number]. Topic: Clinical - Lab/Test Results >> Jan 27, 2024  9:22 AM Rhonda Hudson wrote: Reason for CRM: Patient is returning a call from [place] regarding lab results. Callback number is [phone]

## 2024-01-28 ENCOUNTER — Ambulatory Visit
Admission: RE | Admit: 2024-01-28 | Discharge: 2024-01-28 | Disposition: A | Discharge status: Home / Self Care | Source: Ambulatory Visit | Attending: Medical | Admitting: Medical

## 2024-01-28 DIAGNOSIS — Z122 Encounter for screening for malignant neoplasm of respiratory organs: Secondary | ICD-10-CM | POA: Diagnosis not present

## 2024-01-28 DIAGNOSIS — F17200 Nicotine dependence, unspecified, uncomplicated: Secondary | ICD-10-CM

## 2024-01-28 DIAGNOSIS — Z87891 Personal history of nicotine dependence: Secondary | ICD-10-CM | POA: Diagnosis not present

## 2024-02-12 ENCOUNTER — Other Ambulatory Visit: Payer: Self-pay | Admitting: Medical

## 2024-02-12 ENCOUNTER — Ambulatory Visit: Payer: Self-pay

## 2024-02-12 MED ORDER — MECLIZINE HCL 25 MG PO TABS: 25.0000 mg | ORAL_TABLET | Freq: Two times a day (BID) | ORAL | 0 refills | Status: AC

## 2024-02-12 NOTE — Telephone Encounter (Signed)
 Chief Complaint: Vertigo Symptoms: room spinning, dizzy Frequency: intermittent X 2 weeks Pertinent Negatives: Patient denies chest pain, difficulty breathing, headache, numbness, tingling, weakness Disposition: [] ED /[] Urgent Care (no appt availability in office) / [x] Appointment(In office/virtual)/ []  [facility]/ [] Home Care/ [x] Refused Recommended Disposition /[] [facility]/ []  Follow-up with PCP Additional Notes:  Vertigo intermittently for a couple weeks. She was diagnosed "a long time ago", hasn't needed medicine in a long time, she cannot recall the name of medicine. Requesting prescription to be called in. Dizzy, room spinning when she moves her head. She will need to hold onto things to steady herself. Acute evaluation advised, she refuses and insisting that pcp prescribe medication for vertigo. Aware message will be sent to provider for review and recommendation. Caller disconnected call before care advice could be offered. Please follow up with Ardeen Beath).   Copied from CRM [id number]. Topic: Clinical - Red Word Triage >> Feb 12, 2024 12:54 PM Baldomero Bone wrote: Red Word that prompted transfer to Nurse Triage: Patient needs a prescription for vertigo. She has been experiencing it for about 2 weeks. Callback number is [phone] Reason for Disposition  [1] MODERATE dizziness (e.g., vertigo; feels very unsteady, interferes with normal activities) AND [2] has been evaluated by doctor (or NP/PA) for this  Protocols used: Dizziness - Vertigo-A-AH

## 2024-02-13 NOTE — Telephone Encounter (Signed)
 Pt was notified and will call back to schedule if medication isn't helping

## 2024-02-19 ENCOUNTER — Telehealth: Payer: Self-pay

## 2024-02-19 ENCOUNTER — Ambulatory Visit: Payer: Self-pay | Admitting: Medical

## 2024-02-19 NOTE — Progress Notes (Signed)
 Results sent through MyChart

## 2024-02-19 NOTE — Telephone Encounter (Signed)
 Copied from CRM [id number]. Topic: Clinical - Lab/Test Results >> Feb 19, 2024  1:03 PM Antwanette L wrote: Reason for CRM: Patient is calling about imaging results (lung screening). Please call the patient at [phone]

## 2024-02-20 NOTE — Telephone Encounter (Signed)
 See result notes.

## 2024-02-27 DIAGNOSIS — H52209 Unspecified astigmatism, unspecified eye: Secondary | ICD-10-CM | POA: Diagnosis not present

## 2024-02-27 DIAGNOSIS — H524 Presbyopia: Secondary | ICD-10-CM | POA: Diagnosis not present

## 2024-02-27 DIAGNOSIS — H5203 Hypermetropia, bilateral: Secondary | ICD-10-CM | POA: Diagnosis not present

## 2024-03-09 ENCOUNTER — Ambulatory Visit: Payer: Medicare HMO | Admitting: Internal Medicine

## 2024-03-09 ENCOUNTER — Other Ambulatory Visit

## 2024-03-09 ENCOUNTER — Encounter: Payer: Self-pay | Admitting: Internal Medicine

## 2024-03-09 VITALS — BP 120/70 | HR 90 | Ht 68.0 in | Wt 200.6 lb

## 2024-03-09 DIAGNOSIS — Z7984 Long term (current) use of oral hypoglycemic drugs: Secondary | ICD-10-CM | POA: Diagnosis not present

## 2024-03-09 DIAGNOSIS — E1165 Type 2 diabetes mellitus with hyperglycemia: Secondary | ICD-10-CM | POA: Diagnosis not present

## 2024-03-09 DIAGNOSIS — E042 Nontoxic multinodular goiter: Secondary | ICD-10-CM | POA: Diagnosis not present

## 2024-03-09 DIAGNOSIS — E538 Deficiency of other specified B group vitamins: Secondary | ICD-10-CM | POA: Diagnosis not present

## 2024-03-09 DIAGNOSIS — Z8639 Personal history of other endocrine, nutritional and metabolic disease: Secondary | ICD-10-CM | POA: Diagnosis not present

## 2024-03-09 MED ORDER — CYANOCOBALAMIN 1000 MCG/ML IJ SOLN
1000.0000 ug | Freq: Once | INTRAMUSCULAR | Status: AC
  Administered 2024-03-09: 1000 ug via INTRAMUSCULAR

## 2024-03-09 MED ORDER — FREESTYLE LIBRE 3 PLUS SENSOR MISC: 1.0000 | 3 refills | Status: DC

## 2024-03-09 MED ORDER — FREESTYLE LIBRE 3 READER DEVI: 1.0000 | Freq: Once | 0 refills | Status: AC

## 2024-03-09 NOTE — Progress Notes (Signed)
 Patient ID: Rhonda Hudson, female   DOB: 11-23-1955, 68 y.o.   MRN: [id number]  HPI: Rhonda Hudson is a 68 y.o.-year-old female, returning for follow-up for DM2, dx in 2019, non-insulin-dependent, controlled, without long-term complications. Pt. previously saw Dr. Kassie, but last visit with me 6 months ago.  Interim history: No increased urination, blurry vision, nausea, chest pain.  She previously lost 16 pounds due to severe diarrhea when using metformin .  Weight stabilized after stopping the medication. Now gained 12 lbs since last OV. She has anxiety (mostly related to the political situation).  Reviewed HbA1c: Lab Results  Component Value Date   HGBA1C 6.7 (H) 01/20/2024   HGBA1C 6.4 (A) 09/05/2023   HGBA1C 6.5 (A) 05/06/2023   HGBA1C 6.1 (A) 11/05/2022   HGBA1C 6.3 (A) 05/02/2022   HGBA1C 5.9 (A) 02/05/2022   HGBA1C 6.5 (A) 08/02/2021   HGBA1C 6.4 (A) 01/10/2021   HGBA1C 6.2 (A) 07/11/2020   HGBA1C 6.3 (A) 01/06/2020   Previously on: - Metformin  ER 1000 mg daily in am (dose decreased 07/2021), but stopped due to severe diarrhea  We changed to: - Jardiance  10 mg before breakfast -was forgetting many doses! -Now taking it more consistently  Pt does check her sugars seldom - cannot remember... From last OV: - am: n/c >> 97-123 - 2h after b'fast: n/c - before lunch: 124, 134 >> 121,148 >> n/c - 2h after lunch: 105-145, 166 >> 106, 160 >> n/c - before dinner: n/c - 2h after dinner: n/c >> 100-129 - bedtime: n/c >> 100-120 >> n/c - nighttime: n/c  Glucometer: AccuChek Guide  - no CKD, last BUN/creatinine:  Lab Results  Component Value Date   BUN 8 09/05/2023   BUN 7 03/08/2022   CREATININE 0.82 09/05/2023   CREATININE 0.87 03/08/2022   Lab Results  Component Value Date   MICRALBCREAT 4 09/05/2023   MICRALBCREAT <12 11/14/2021   MICRALBCREAT <6 01/20/2019  She is not on an ACE inhibitor/ARB.  - + HL; last set of lipids: Lab Results  Component Value Date    CHOL 161 09/05/2023   HDL 48 (L) 09/05/2023   LDLCALC 90 09/05/2023   TRIG 136 09/05/2023   CHOLHDL 3.4 09/05/2023  She is on Lipitor 10 mg daily.  - last eye exam was on 12/10/2023.  No DR.  - no numbness and tingling in her feet.  Last foot exam 05/06/2023.  Multiple thyroid  nodules: - dx'ed in ~1980s - at that time, a cyst was drained per review of Dr. Laymond note  Thyroid  U/S (03/22/2019): Parenchymal Echotexture: Mildly heterogenous  Isthmus: 0.4 cm  Right lobe: 6.5 cm x 5.1 cm x 5.2 cm  Left lobe: 5.1 cm x 2.3 cm x 2.0 cm  _________________________________________________________   Estimated total number of nodules >/= 1 cm: 3 _________________________________________________________   Right thyroid  lesion is characterized based on recent CT imaging series, and not strictly TIRADS criteria. The lesion measures 5.5 cm x 4.0 cm x 4.7 cm within the mid right thyroid . Relatively heterogeneously hypoechoic internal tissue/fluid with a well-defined rim. No internal color flow. There is a focus centrally of slightly increased echogenicity, representing blood products and/or debris. The size is relatively unchanged from the CT dated 03/17/2019.   Nodule # 2:  Location: Left; Superior  Maximum size: 2.3 cm; Other 2 dimensions: 1.3 cm x 1.9 cm  Composition: solid/almost completely solid (2)  Echogenicity: hypoechoic (2) Echogenic foci: punctate echogenic foci (3)  ACR TI-RADS total points: 7. ACR  TI-RADS risk category: TR5 (>/= 7 points). Nodule meets criteria for biopsy. _________________________________________________________   Nodule # 3:  Location: Left; Inferior  Maximum size: 1.3 cm; Other 2 dimensions: 0.6 cm x 1.0 cm  Composition: cannot determine (2)  Echogenicity: hypoechoic (2) Nodule meets criteria for surveillance.  _________________________________________________________   Nodule # 4:  Location: Left; Inferior  Maximum size: 1.2 cm; Other 2  dimensions: 1.0 cm x 0.5 cm  Composition: cystic/almost completely cystic (0) Cystic nodule does not meet criteria for surveillance or biopsy.  _________________________________________________________   No adenopathy   IMPRESSION: Complex cystic lesion of the right thyroid  measures 5.5 cm. Etiology may be inflammatory or infectious, less likely malignant. Percutaneous sampling may be considered.   Left upper thyroid  nodule (labeled 2) meets criteria for biopsy, as designated by the newly established ACR TI-RADS criteria, and referral for biopsy is recommended.   Left lower thyroid  nodule (labeled 3) meets criteria for surveillance, as designated by the newly established ACR TI-RADS criteria. Surveillance ultrasound study recommended to be performed annually up to 5 years.  FNA (03/28/2022): Adequacy Reason Satisfactory For Evaluation. Diagnosis THYROID , FINE NEEDLE ASPIRATION, LUP (SPECIMEN 1 OF 3, COLLECTED 03/29/19): SCANT FOLLICULAR EPITHELIUM PRESENT (BETHESDA CATEGORY I). Specimen Clinical Information TR4 lup nodule Source Thyroid , Fine Needle Aspiration, LUP, (Specimen 1 of 3, collected on 03/29/19)  Adequacy Reason Satisfactory For Evaluation. Diagnosis PAROTID GLAND, FINE NEEDLE ASPIRATION, RIGHT (SPECIMEN 2 OF 3 COLLECTED 03-29-2019) FEW ATYPICAL CELLS PRESENT. SEE COMMENT. COMMENT: THERE ARE SCATTERED SMALL GROUPS OF ATYPICAL CELLS PRESENT WHICH APPEAR TO BE SQUAMOUS CELLS. THE DIFFERENTIAL DIAGNOSIS IS BROAD, BUT A LOW GRADE MALIGNANCY CAN NOT BE ENTIRELY RULED OUT. TISSUE STUDIES MAY HELP BETTER EVALUATE THE EXTENT AND SEVERITY OF THE ATYPICAL CELLS. DR. CANACCI HAS REVIEWED THE CASE AND IS IN ESSENTIAL AGREEMENT WITH THIS INTERPRETATION. FONDA STARKS MD Pathologist, Electronic Signature (Case signed 04/01/2019) Specimen Clinical Information Right parotid mass Final Cytologic Interpretation   Right parotid gland, Fine Needle Aspiration II (smears and cell   block):       Findings consistent with Warthin's tumor.    Adequacy Reason Satisfactory For Evaluation. Diagnosis THYROID , FINE NEEDLE ASPIRATION RIGHT CYST (SPECIMEN 3 OF 3 COLLECTED 03/29/2019) FINDINGS CONSISTENT WITH THE CONTENTS OF A CYST. FONDA STARKS MD Pathologist, Electronic Signature (Case signed 03/30/2019) Specimen Clinical Information Enlarging cyst Source Thyroid , Fine Needle Aspiration, Right Cyst, (Specimen 3 of 3, collected on 03/29/19)  Thyroid  U/S (07/24/2020): Parenchymal Echotexture: Moderately heterogenous  Isthmus: Normal in size measuring 0.5 cm in diameter, unchanged  Right lobe: Borderline enlarged measuring 5.5 x 1.8 x 1.8 cm, previously, 6.5 x 5.1 x 5.2 cm  Left lobe: Enlarged measures 6.0 x 2.0 x 1.9 cm, previously, 5.1 x 2.3 x 2.0 cm.  _________________________________________________________   Estimated total number of nodules >/= 1 cm: 5 _________________________________________________________   The previously identified approximately 5.5 x 4.7 x 3.9 cm hypoechoic complex cyst replacing the majority of the right lobe of the thyroid  has nearly completely resolved in the interval with residual apparent cyst measuring approximately 1.1 x 0.5 x 0.5 cm.   There are several additional scattered punctate (sub 0.8 cm) hypoechoic nodules within right lobe of the thyroid , none of which meet imaging criteria to recommend percutaneous sampling or continued dedicated follow-up  _________________________________________________________   Nodule # 5:  Location: Left; Superior - this nodule was not definitely seen on the 03/2019 examination  Maximum size: 1.6 cm; Other 2 dimensions: 1.2 x 0.8 cm  Composition: solid/almost completely  solid (2)  Echogenicity: hypoechoic (2) **Given size (>/= 1.5 cm) and appearance, fine needle aspiration of this moderately suspicious nodule should be considered based on TI-RADS criteria.   _________________________________________________________   The previously biopsied 1.9 x 1.9 x 1.1 cm hypoechoic nodule within the mid aspect of the left lobe of the thyroid  (labeled 6), is unchanged to decreased in size compared to the 03/2019 examination, previously, 2.3 x 1.9 x 1.3 cm. Correlation with previous biopsy results is advised.  _________________________________________________________   Nodule # 7:  Prior biopsy: No  Location: Left; Mid  Maximum size: 1.2 cm; Other 2 dimensions: 1.0 x 0.6 cm, previously, 1.3 x 1.0 x 0.6 cm  Composition: cystic/almost completely cystic (0) Change in features: Yes now appears anechoic and cystic, previously, hypoechoic and indeterminate This nodule does NOT meet TI-RADS criteria for biopsy or dedicated follow-up.  _________________________________________________________   There is an approximately 1.0 x 0.9 x 0.8 cm minimally complex cyst within the inferior pole the left lobe thyroid  labeled 8), not definitely seen on the 07/20 20 examination though does not meet criteria to recommend percutaneous sampling or continued dedicated follow-up.   IMPRESSION: 1. Interval development of an apparent new left-sided thyroid  nodule (labeled #5) which meets imaging criteria to recommend percutaneous sampling as indicated. 2. Near complete resolution of previously aspirated right-sided thyroid  cyst. 3. Previously biopsied left-sided thyroid  nodule (labeled #6) is unchanged to decreased in size compared to the 03/2019 examination. Correlation with previous biopsy results is advised. Assuming a benign pathologic diagnosis, repeat sampling and/or continued dedicated follow-up is not recommended. 4. Additional left-sided thyroid  nodule (labeled #7), previously meeting criteria to recommend surveillance, now appears anechoic and cystic and as such has been down graded from a TR4 nodule to a TR1 nodule and no longer meets imaging criteria to  recommend continued dedicated follow-up.  FNA (08/03/2020): Clinical History: Left; Superior - this nodule was not definitely seen  on the 03/2019 examination, 1.6 cm;  Other 2 dimensions: 1.2 x 0.8cm  solid/almost completely solid hypoechoic TI-RADS - 4  Specimen Submitted:  A. THYROID , LUP, FINE NEEDLE ASPIRATION:   FINAL MICROSCOPIC DIAGNOSIS:  - Consistent with benign follicular nodule (Bethesda category II)   SPECIMEN ADEQUACY:  Satisfactory for evaluation   Thyroid  U/S (08/22/2021): Parenchymal Echotexture: Mildly heterogenous  Isthmus: 0.5 cm  Right lobe: 5.1 x 1.7 x 1.6 cm  Left lobe: 5.0 x 2.2 x 2.0 cm  _________________________________________________________   Estimated total number of nodules >/= 1 cm: 4 _________________________________________________________   Nodule # 5: The previously biopsied nodule in the left superior gland remains stable to slightly smaller at 1.4 x 0.8 x 0.8 cm compared to 1.6 x 1.2 x 0.8 cm previously.   Nodule # 6: Previously biopsied nodule in the left mid to upper gland is similar in size at 2.1 x 1.8 x 1.3 cm compared to 1.9 x 1.9 x 1.1 cm previously.   Numerous additional small cysts and nodules scattered throughout the thyroid  gland. None of these meet criteria to warrant further evaluation or biopsy.   IMPRESSION: 1. Slow involution of previously biopsied nodule in the left superior gland. Involution over time is consistent with benignity. 2. Continued stability of previously biopsied nodule in the left mid gland. Recommend correlation with prior biopsy results. 3. Numerous additional small cysts and nodules bilaterally, none of which meet criteria to warrant further evaluation. 4. No new or suspicious nodules.   R lobe:   Sup. L lobe:   Thyroid  U/S (12/02/2022):  Parenchymal Echotexture: Moderately heterogenous  Isthmus: 0.5 cm  Right lobe: 5.0 cm x 2.0 cm x 1.8 cm  Left lobe: 5.3 cm x 1.9 cm x 2.2 cm   _________________________________________________________   Estimated total number of nodules >/= 1 cm: 4  _________________________________________________________   Nodule 1 right superior thyroid , 9 mm. Nodule does not meet criteria for surveillance.   Nodule labeled 2 mid right thyroid , 7 mm. Nodule does not meet criteria for surveillance.   Nodule labeled 3, inferior right thyroid , 8 mm. Nodule does not meet criteria for surveillance.   Nodule labeled 4, inferior right thyroid , favored to represent a pseudo nodule in a region of heterogeneous thyroid  tissue and does not meet specific criteria for surveillance.   Nodule labeled 5, superior left thyroid  1.9 cm, previously 1.4 cm. This nodule has undergone prior biopsy 08/03/2020. Assuming benign result no further specific follow-up would be indicated.   Nodule labeled 6, left mid thyroid , 2.1 cm, unchanged. Nodule has undergone prior biopsy 03/29/2019 and assuming benign result no further specific follow-up would be indicated.   Nodule labeled 7, inferior left thyroid , 8 mm. This has decreased in size over time and now is below threshold for further surveillance.   Nodule labeled 8, inferior left thyroid , 1.4 cm, increased in size from the prior. Nodule meets criteria for surveillance.   No adenopathy   Recommendations follow those established by the new ACR TI-RADS criteria (J Am Coll Radiol 2017;14:587-595).   IMPRESSION: Multinodular thyroid  as above.   Left inferior thyroid  nodule (labeled 8, 1.4 cm) now meets criteria for surveillance, as designated by the newly established ACR TI-RADS criteria. Surveillance ultrasound study recommended to be performed annually up to 5 years.   Pt denies: - feeling nodules in neck - hoarseness - dysphagia She has choking with meat occasionally.  History of hyperthyroidism,: - latest TFTs have been normal: Lab Results  Component Value Date   TSH 0.67 09/05/2023   TSH  1.08 11/05/2022   TSH 0.678 11/14/2021   TSH 1.15 07/11/2020   TSH 0.846 01/20/2019   TSH 0.709 05/06/2018   TSH 0.46 01/04/2016   FREET4 1.32 11/14/2021   FREET4 0.84 07/11/2020   FREET4 1.26 01/20/2019   FREET4 1.17 05/06/2018   + FH of thyroid  ds. In son; no thyroid  cancer. No h/o radiation tx to head or neck. No herbal supplements. No Biotin use. No recent steroids use.   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   Panic attacks    Hx of   Thickened endometrium    Thyroid  nodule 03/03/2019   Past Surgical History:  Procedure Laterality Date   CHOLECYSTECTOMY     CYSTOSCOPY  11/04/2019   Procedure: CYSTOSCOPY;  Surgeon: Barbette Knock, MD;  Location: [facility];  Service: Gynecology;;   DILATION AND CURETTAGE OF UTERUS N/A 06/16/2018  Procedure: DILATATION AND CURETTAGE;  Surgeon: Starla Harland BROCKS, MD;  Location: [facility];  Service: Gynecology;  Laterality: N/A;   ROBOTIC ASSISTED TOTAL HYSTERECTOMY WITH BILATERAL SALPINGO OOPHERECTOMY Bilateral 11/04/2019   Procedure: XI ROBOTIC ASSISTED TOTAL HYSTERECTOMY WITH BILATERAL SALPINGO OOPHORECTOMY/Pelvic Washings;  Surgeon: Barbette Knock, MD;  Location: [facility];  Service: Gynecology;  Laterality: Bilateral;  Tracie to RNFA confirmed on 10/15/19 CS   TOE SURGERY     TUBAL LIGATION     US  ECHOCARDIOGRAPHY  01-31-2009   EF 60%   Social History    Socioeconomic History   Marital status: Divorced    Spouse name: Not on file   Number of children: 2   Years of education: 12   Highest education level: 12th grade  Occupational History   Occupation: Social Security Disability  Tobacco Use   Smoking status: Every Day    Current packs/day: 0.50    Average packs/day: 0.5 packs/day for 45.5 years (22.7 ttl pk-yrs)    Types: Cigarettes    Start date: 1980   Smokeless tobacco: Never  Vaping Use   Vaping status: Never Used  Substance and Sexual Activity   Alcohol use: No    Alcohol/week: 0.0 standard drinks of alcohol   Drug use: No   Sexual activity: Not Currently    Birth control/protection: Post-menopausal  Other Topics Concern   Not on file  Social History Narrative   Lives with on of her 3 sons, no grandchildren   Caffeine use: coffee and coke daily   Right-handed   Retired   Exercise - walks, mows, chops her wood   11/2021   Social Drivers of Corporate investment banker Strain: Low Risk  (01/20/2024)   Overall Financial Resource Strain (CARDIA)    Difficulty of Paying Living Expenses: Not hard at all  Food Insecurity: No Food Insecurity (01/20/2024)   Hunger Vital Sign    Worried About Running Out of Food in the Last Year: Never true    Ran Out of Food in the Last Year: Never true  Transportation Needs: No Transportation Needs (01/20/2024)   PRAPARE - Administrator, Civil Service (Medical): No    Lack of Transportation (Non-Medical): No  Physical Activity: Sufficiently Active (01/20/2024)   Exercise Vital Sign    Days of Exercise per Week: 7 days    Minutes of Exercise per Session: 40 min  Stress: Stress Concern Present (01/20/2024)   [employer] of Occupational Health - Occupational Stress Questionnaire    Feeling of Stress : Very much  Social Connections: Unknown (01/20/2024)   Social Connection and Isolation Panel    Frequency of Communication with Friends and Family: More than three times a week     Frequency of Social Gatherings with Friends and Family: More than three times a week    Attends Religious Services: More than 4 times per year    Active Member of [employer] or Organizations: Yes    Attends Banker Meetings: More than 4 times per year    Marital Status: Patient declined  Intimate Partner Violence: Not At Risk (01/20/2024)   Humiliation, Afraid, Rape, and Kick questionnaire    Fear of Current or Ex-Partner: No    Emotionally Abused: No    Physically Abused: No    Sexually Abused: No   Current Outpatient Medications on File Prior to Visit  Medication Sig Dispense Refill   ACCU-CHEK GUIDE TEST test strip USE AS INSTRUCTED TO  CHECK BLOOD SUGAR ONCE DAILY 100 strip 3   Accu-Chek Softclix Lancets lancets USE AS INSTRUCTED TO CHECK BLOOD SUGAR ONCE DAILY 100 each 3   atorvastatin  (LIPITOR) 10 MG tablet TAKE 1 TABLET EVERY DAY 90 tablet 1   Blood Glucose Monitoring Suppl (ACCU-CHEK GUIDE) w/Device KIT Use as instructed to check blood sugar 1X daily 1 kit 0   cyanocobalamin  (VITAMIN B12) 1000 MCG tablet TAKE 1 TABLET EVERY DAY 90 tablet 1   diphenhydrAMINE-APAP, sleep, (ACETAMINOPHEN  PM PO) Take by mouth.     JARDIANCE  10 MG TABS tablet TAKE 1 TABLET EVERY DAY BEFORE BREAKFAST 90 tablet 3   meclizine  (ANTIVERT ) 25 MG tablet Take 1 tablet (25 mg total) by mouth 2 (two) times daily. 30 tablet 0   rivaroxaban  (XARELTO ) 10 MG TABS tablet Take 1 tablet (10 mg total) by mouth daily. 90 tablet 3   No current facility-administered medications on file prior to visit.   No Known Allergies Family History  Problem Relation Age of Onset   Diabetes Mother    Leukemia Father    Leukemia Sister    Diabetes Sister    Leukemia Brother    Multiple sclerosis Neg Hx    Autoimmune disease Neg Hx    Thyroid  disease Neg Hx    PE: BP 120/70   Pulse 90   Ht 5' 8 (1.727 m)   Wt 200 lb 9.6 oz (91 kg)   SpO2 95%   BMI 30.50 kg/m  Wt Readings from Last 3 Encounters:  03/09/24 200 lb  9.6 oz (91 kg)  01/20/24 188 lb (85.3 kg)  12/02/23 188 lb (85.3 kg)   Constitutional: overweight, in NAD Eyes: no exophthalmos ENT: no thyromegaly, no cervical lymphadenopathy Cardiovascular: RRR, No MRG Respiratory: CTA B Musculoskeletal: no deformities Skin: no rashes Neurological: + tremor with outstretched hands Diabetic Foot Exam - Simple   Simple Foot Form Diabetic Foot exam was performed with the following findings: Yes 03/09/2024  8:10 AM  Visual Inspection No deformities, no ulcerations, no other skin breakdown bilaterally: Yes Sensation Testing Intact to touch and monofilament testing bilaterally: Yes Pulse Check Posterior Tibialis and Dorsalis pulse intact bilaterally: Yes Comments + varicosities L foot + dry, indurated skin and calluses B feet    ASSESSMENT: 1. DM2, non-insulin-dependent, controlled, without long-term complications  2.  Multiple thyroid  nodules  3.  History of hypothyroidism  PLAN:  1. Patient with longstanding, uncontrolled, type 2 diabetes, on oral antidiabetic regimen with low-dose Jardiance  only after metformin  was stopped due to severe diarrhea which prevented her from leaving the house.  HbA1c at last visit was at goal, at 6.4% and sugars were at goal, but she had another HbA1c obtained last month and this was slightly higher, at 6.7%. - At today's visit, she is not checking blood sugars.  I strongly advised her to start.  Based on the HbA1c still at goal, I did not suggest a change in medication, however, this is higher than before and we discussed that she absolutely needs to start checking blood sugars as this can influence behavioral modification.  She would be interested in trying a CGM.  I am hoping that this is covered for her.  I called in the freestyle libre 3+ CGM. - I suggested to:  Patient Instructions  Please continue: - Jardiance  10 mg daily in am   Try to start the [place] sensor.  HAPPY BIRTHDAY!!!!  Please return in 6  months with your sugar log.   -  advised to check sugars at different times of the day - 4x a day, rotating check times - advised for yearly eye exams >> she is UTD - return to clinic in 6 months  2.  Multiple thyroid  nodules - Reviewed previous ultrasound reports including the study from 11/2022.  She has a left inferior 1.4 cm nodule that increased in size and is now qualifies for yearly follow-up, otherwise, nodules were stable or decreased in size.  Of note, the dominant nodules in the left upper lobe were both biopsied with benign results in the past - Neck compression symptoms except occasional choking with dry foods, which is not new - Plan to recheck another ultrasound now  3.  History of hyperthyroidism -Resolved - Latest TFTs were normal at last visit: Lab Results  Component Value Date   TSH 0.67 09/05/2023  - No signs or symptoms of thyrotoxicosis at today's visit - Will continue to follow her expectantly and keep an eye on her TFTs  Orders Placed This Encounter  Procedures   US  THYROID    Lela Fendt, MD PhD [facility] Endocrinology

## 2024-03-09 NOTE — Patient Instructions (Addendum)
 Please continue: - Jardiance  10 mg daily in am   Try to start the [place] sensor.  HAPPY BIRTHDAY!!!!  Please return in 6 months with your sugar log.

## 2024-03-12 ENCOUNTER — Ambulatory Visit
Admission: RE | Admit: 2024-03-12 | Discharge: 2024-03-12 | Disposition: A | Discharge status: Home / Self Care | Source: Ambulatory Visit | Attending: Internal Medicine | Admitting: Internal Medicine

## 2024-03-12 DIAGNOSIS — E042 Nontoxic multinodular goiter: Secondary | ICD-10-CM | POA: Diagnosis not present

## 2024-03-14 ENCOUNTER — Ambulatory Visit: Payer: Self-pay | Admitting: Internal Medicine

## 2024-03-17 ENCOUNTER — Telehealth: Payer: Self-pay

## 2024-03-17 NOTE — Telephone Encounter (Signed)
 Pharmacy Patient Advocate Encounter   Received notification from CoverMyMeds that prior authorization for Freestyle libre 3 plus is required/requested.   Insurance verification completed.   The patient is insured through [employer] .   Per PA form:   I do not see that pt meets these requirements.

## 2024-03-21 ENCOUNTER — Other Ambulatory Visit: Payer: Self-pay | Admitting: Medical

## 2024-03-21 DIAGNOSIS — E7800 Pure hypercholesterolemia, unspecified: Secondary | ICD-10-CM

## 2024-03-22 ENCOUNTER — Other Ambulatory Visit: Payer: Self-pay | Admitting: Medical

## 2024-03-22 NOTE — Telephone Encounter (Signed)
 Lipids done 08/2023 and normal

## 2024-03-31 ENCOUNTER — Other Ambulatory Visit (HOSPITAL_COMMUNITY)

## 2024-03-31 ENCOUNTER — Encounter (HOSPITAL_COMMUNITY): Payer: Self-pay

## 2024-04-23 ENCOUNTER — Other Ambulatory Visit: Payer: Self-pay | Admitting: Internal Medicine

## 2024-04-23 DIAGNOSIS — E1165 Type 2 diabetes mellitus with hyperglycemia: Secondary | ICD-10-CM

## 2024-04-28 DIAGNOSIS — D225 Melanocytic nevi of trunk: Secondary | ICD-10-CM | POA: Diagnosis not present

## 2024-04-28 DIAGNOSIS — Z08 Encounter for follow-up examination after completed treatment for malignant neoplasm: Secondary | ICD-10-CM | POA: Diagnosis not present

## 2024-04-28 DIAGNOSIS — L538 Other specified erythematous conditions: Secondary | ICD-10-CM | POA: Diagnosis not present

## 2024-04-28 DIAGNOSIS — L814 Other melanin hyperpigmentation: Secondary | ICD-10-CM | POA: Diagnosis not present

## 2024-04-28 DIAGNOSIS — L821 Other seborrheic keratosis: Secondary | ICD-10-CM | POA: Diagnosis not present

## 2024-04-28 DIAGNOSIS — L570 Actinic keratosis: Secondary | ICD-10-CM | POA: Diagnosis not present

## 2024-04-28 DIAGNOSIS — Z85828 Personal history of other malignant neoplasm of skin: Secondary | ICD-10-CM | POA: Diagnosis not present

## 2024-04-28 DIAGNOSIS — D492 Neoplasm of unspecified behavior of bone, soft tissue, and skin: Secondary | ICD-10-CM | POA: Diagnosis not present

## 2024-04-28 DIAGNOSIS — L578 Other skin changes due to chronic exposure to nonionizing radiation: Secondary | ICD-10-CM | POA: Diagnosis not present

## 2024-05-11 ENCOUNTER — Telehealth: Payer: Self-pay | Admitting: Internal Medicine

## 2024-05-11 NOTE — Telephone Encounter (Signed)
 I did not call and I do not see anything in chart. And was asked upfront if anyone called her and no one called  Copied from CRM #[id number]. Topic: General - Call Back - No Documentation >> May 11, 2024 12:09 PM Charlet HERO wrote: Reason for CRM: Patient is returning call ckd chart and did not see any notes of anyone reaching out. Please call back the patient.

## 2024-06-28 DIAGNOSIS — L570 Actinic keratosis: Secondary | ICD-10-CM | POA: Diagnosis not present

## 2024-07-22 ENCOUNTER — Encounter: Payer: Medicare HMO | Admitting: Medical

## 2024-08-16 ENCOUNTER — Other Ambulatory Visit: Payer: Self-pay | Admitting: Medical

## 2024-08-16 DIAGNOSIS — E7800 Pure hypercholesterolemia, unspecified: Secondary | ICD-10-CM

## 2024-08-30 ENCOUNTER — Ambulatory Visit: Admitting: Internal Medicine

## 2024-09-02 NOTE — Progress Notes (Signed)
 Rhonda Hudson                                          MRN: [id number]   09/02/2024   The [facility] Quality Team Specialist reviewed this patient medical record for the purposes of chart review for care gap closure. The following were reviewed: chart review for care gap closure-kidney health evaluation for diabetes:uACR.    [facility]BCI Quality Team

## 2024-09-03 ENCOUNTER — Other Ambulatory Visit: Payer: Self-pay | Admitting: Medical

## 2024-10-05 ENCOUNTER — Ambulatory Visit: Admitting: Internal Medicine

## 2024-10-05 ENCOUNTER — Encounter: Payer: Self-pay | Admitting: Internal Medicine

## 2024-10-05 ENCOUNTER — Other Ambulatory Visit

## 2024-10-05 VITALS — BP 120/70 | HR 87

## 2024-10-05 DIAGNOSIS — E042 Nontoxic multinodular goiter: Secondary | ICD-10-CM | POA: Diagnosis not present

## 2024-10-05 DIAGNOSIS — E1165 Type 2 diabetes mellitus with hyperglycemia: Secondary | ICD-10-CM

## 2024-10-05 DIAGNOSIS — Z7984 Long term (current) use of oral hypoglycemic drugs: Secondary | ICD-10-CM

## 2024-10-05 DIAGNOSIS — Z8639 Personal history of other endocrine, nutritional and metabolic disease: Secondary | ICD-10-CM

## 2024-10-05 LAB — POCT GLYCOSYLATED HEMOGLOBIN (HGB A1C): Hemoglobin A1C: 6.6 % — AB (ref 4.0–5.6)

## 2024-10-05 MED ORDER — ACCU-CHEK GUIDE TEST VI STRP: ORAL_STRIP | 3 refills | Status: AC

## 2024-10-05 MED ORDER — FREESTYLE LIBRE 3 PLUS SENSOR MISC: 1.0000 | 3 refills | Status: AC

## 2024-10-05 MED ORDER — ACCU-CHEK SOFTCLIX LANCETS MISC: 3 refills | Status: AC

## 2024-10-05 NOTE — Patient Instructions (Addendum)
 Please continue: - Jardiance  10 mg daily in am   Try to start the [place] sensor.  Please return in 6 months.

## 2024-10-05 NOTE — Progress Notes (Signed)
 Patient ID: Rhonda Hudson, female   DOB: 16-Aug-1956, 69 y.o.   MRN: [id number]  HPI: Rhonda Hudson is a 69 y.o.-year-old female, returning for follow-up for DM2, dx in 2019, non-insulin-dependent, controlled, without long-term complications. Pt. previously saw Dr. Kassie, but last visit with me 7 months ago.  Interim history: No increased urination, blurry vision, nausea, chest pain.  She previously lost 16 pounds due to severe diarrhea when using metformin .  She gained 12 lbs for last visit after stopping metformin .  Diarrhea resolved. At today's visit, she declined being weighed. 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  Reviewed HbA1c: Lab Results  Component Value Date   HGBA1C 6.7 (H) 01/20/2024   HGBA1C 6.4 (A) 09/05/2023   HGBA1C 6.5 (A) 05/06/2023   HGBA1C 6.1 (A) 11/05/2022   HGBA1C 6.3 (A) 05/02/2022   HGBA1C 5.9 (A) 02/05/2022   HGBA1C 6.5 (A) 08/02/2021   HGBA1C 6.4 (A) 01/10/2021   HGBA1C 6.2 (A) 07/11/2020   HGBA1C 6.3 (A) 01/06/2020   Previously on: - Metformin  ER 1000 mg daily in am (dose decreased 07/2021), but stopped due to severe diarrhea  We changed to: - Jardiance  10 mg before breakfast   Pt is still not checking blood sugars.  I recommended a CGM - did not get the CGM. From last time... - am: n/c >> 97-123 - 2h after b'fast: n/c - before lunch: 124, 134 >> 121,148 >> n/c - 2h after lunch: 105-145, 166 >> 106, 160 >> n/c - before dinner: n/c - 2h after dinner: n/c >> 100-129 - bedtime: n/c >> 100-120 >> n/c - nighttime: n/c  Glucometer: AccuChek Guide  - no CKD, last BUN/creatinine:  Lab Results  Component Value Date   BUN 8 09/05/2023   BUN 7 03/08/2022   CREATININE 0.82 09/05/2023   CREATININE 0.87 03/08/2022   Lab Results  Component Value Date   MICRALBCREAT 4 09/05/2023   MICRALBCREAT <12 11/14/2021   MICRALBCREAT <6 01/20/2019  She is not on an ACE inhibitor/ARB.  - + HL; last  set of lipids: Lab Results  Component Value Date   CHOL 161 09/05/2023   HDL 48 (L) 09/05/2023   LDLCALC 90 09/05/2023   TRIG 136 09/05/2023   CHOLHDL 3.4 09/05/2023  She is on Lipitor 10 mg daily.  - last eye exam was on 12/10/2023.  No DR.  - no numbness and tingling in her feet.  Last foot exam 03/09/2024.  Multiple thyroid  nodules: - dx'ed in ~1980s - at that time, a cyst was drained per review of Dr. Laymond note  Thyroid  U/S (03/22/2019): Parenchymal Echotexture: Mildly heterogenous  Isthmus: 0.4 cm  Right lobe: 6.5 cm x 5.1 cm x 5.2 cm  Left lobe: 5.1 cm x 2.3 cm x 2.0 cm  _________________________________________________________   Estimated total number of nodules >/= 1 cm: 3 _________________________________________________________   Right thyroid  lesion is characterized based on recent CT imaging series, and not strictly TIRADS criteria. The lesion measures 5.5 cm x 4.0 cm x 4.7 cm within the mid right thyroid . Relatively heterogeneously hypoechoic internal tissue/fluid with a well-defined rim. No internal color flow. There is a focus centrally of slightly increased echogenicity, representing blood products and/or debris. The size is relatively unchanged from the CT dated 03/17/2019.   Nodule # 2:  Location: Left; Superior  Maximum size: 2.3 cm; Other 2 dimensions: 1.3 cm x 1.9 cm  Composition: solid/almost completely solid (2)  Echogenicity: hypoechoic (2) Echogenic foci: punctate echogenic foci (3)  ACR TI-RADS total points: 7. ACR TI-RADS risk category: TR5 (>/= 7 points). Nodule meets criteria for biopsy. _________________________________________________________   Nodule # 3:  Location: Left; Inferior  Maximum size: 1.3 cm; Other 2 dimensions: 0.6 cm x 1.0 cm  Composition: cannot determine (2)  Echogenicity: hypoechoic (2) Nodule meets criteria for surveillance.  _________________________________________________________   Nodule # 4:  Location:  Left; Inferior  Maximum size: 1.2 cm; Other 2 dimensions: 1.0 cm x 0.5 cm  Composition: cystic/almost completely cystic (0) Cystic nodule does not meet criteria for surveillance or biopsy.  _________________________________________________________   No adenopathy   IMPRESSION: Complex cystic lesion of the right thyroid  measures 5.5 cm. Etiology may be inflammatory or infectious, less likely malignant. Percutaneous sampling may be considered.   Left upper thyroid  nodule (labeled 2) meets criteria for biopsy, as designated by the newly established ACR TI-RADS criteria, and referral for biopsy is recommended.   Left lower thyroid  nodule (labeled 3) meets criteria for surveillance, as designated by the newly established ACR TI-RADS criteria. Surveillance ultrasound study recommended to be performed annually up to 5 years.  FNA (03/28/2022): Adequacy Reason Satisfactory For Evaluation. Diagnosis THYROID , FINE NEEDLE ASPIRATION, LUP (SPECIMEN 1 OF 3, COLLECTED 03/29/19): SCANT FOLLICULAR EPITHELIUM PRESENT (BETHESDA CATEGORY I). Specimen Clinical Information TR4 lup nodule Source Thyroid , Fine Needle Aspiration, LUP, (Specimen 1 of 3, collected on 03/29/19)  Adequacy Reason Satisfactory For Evaluation. Diagnosis PAROTID GLAND, FINE NEEDLE ASPIRATION, RIGHT (SPECIMEN 2 OF 3 COLLECTED 03-29-2019) FEW ATYPICAL CELLS PRESENT. SEE COMMENT. COMMENT: THERE ARE SCATTERED SMALL GROUPS OF ATYPICAL CELLS PRESENT WHICH APPEAR TO BE SQUAMOUS CELLS. THE DIFFERENTIAL DIAGNOSIS IS BROAD, BUT A LOW GRADE MALIGNANCY CAN NOT BE ENTIRELY RULED OUT. TISSUE STUDIES MAY HELP BETTER EVALUATE THE EXTENT AND SEVERITY OF THE ATYPICAL CELLS. DR. CANACCI HAS REVIEWED THE CASE AND IS IN ESSENTIAL AGREEMENT WITH THIS INTERPRETATION. FONDA STARKS MD Pathologist, Electronic Signature (Case signed 04/01/2019) Specimen Clinical Information Right parotid mass Final Cytologic Interpretation   Right parotid gland,  Fine Needle Aspiration II (smears and cell  block):       Findings consistent with Warthin's tumor.    Adequacy Reason Satisfactory For Evaluation. Diagnosis THYROID , FINE NEEDLE ASPIRATION RIGHT CYST (SPECIMEN 3 OF 3 COLLECTED 03/29/2019) FINDINGS CONSISTENT WITH THE CONTENTS OF A CYST. FONDA STARKS MD Pathologist, Electronic Signature (Case signed 03/30/2019) Specimen Clinical Information Enlarging cyst Source Thyroid , Fine Needle Aspiration, Right Cyst, (Specimen 3 of 3, collected on 03/29/19)  Thyroid  U/S (07/24/2020): Parenchymal Echotexture: Moderately heterogenous  Isthmus: Normal in size measuring 0.5 cm in diameter, unchanged  Right lobe: Borderline enlarged measuring 5.5 x 1.8 x 1.8 cm, previously, 6.5 x 5.1 x 5.2 cm  Left lobe: Enlarged measures 6.0 x 2.0 x 1.9 cm, previously, 5.1 x 2.3 x 2.0 cm.  _________________________________________________________   Estimated total number of nodules >/= 1 cm: 5 _________________________________________________________   The previously identified approximately 5.5 x 4.7 x 3.9 cm hypoechoic complex cyst replacing the majority of the right lobe of the thyroid  has nearly completely resolved in the interval with residual apparent cyst measuring approximately 1.1 x 0.5 x 0.5 cm.   There are several additional scattered punctate (sub 0.8 cm) hypoechoic nodules within right lobe of the thyroid , none of which meet imaging criteria to recommend percutaneous sampling or continued dedicated follow-up  _________________________________________________________   Nodule # 5:  Location: Left; Superior - this nodule was not definitely seen on the 03/2019 examination  Maximum size: 1.6 cm; Other 2 dimensions: 1.2 x 0.8 cm  Composition: solid/almost completely solid (2)  Echogenicity: hypoechoic (2) **Given size (>/= 1.5 cm) and appearance, fine needle aspiration of this moderately suspicious nodule should be considered based on TI-RADS  criteria.  _________________________________________________________   The previously biopsied 1.9 x 1.9 x 1.1 cm hypoechoic nodule within the mid aspect of the left lobe of the thyroid  (labeled 6), is unchanged to decreased in size compared to the 03/2019 examination, previously, 2.3 x 1.9 x 1.3 cm. Correlation with previous biopsy results is advised.  _________________________________________________________   Nodule # 7:  Prior biopsy: No  Location: Left; Mid  Maximum size: 1.2 cm; Other 2 dimensions: 1.0 x 0.6 cm, previously, 1.3 x 1.0 x 0.6 cm  Composition: cystic/almost completely cystic (0) Change in features: Yes now appears anechoic and cystic, previously, hypoechoic and indeterminate This nodule does NOT meet TI-RADS criteria for biopsy or dedicated follow-up.  _________________________________________________________   There is an approximately 1.0 x 0.9 x 0.8 cm minimally complex cyst within the inferior pole the left lobe thyroid  labeled 8), not definitely seen on the 07/20 20 examination though does not meet criteria to recommend percutaneous sampling or continued dedicated follow-up.   IMPRESSION: 1. Interval development of an apparent new left-sided thyroid  nodule (labeled #5) which meets imaging criteria to recommend percutaneous sampling as indicated. 2. Near complete resolution of previously aspirated right-sided thyroid  cyst. 3. Previously biopsied left-sided thyroid  nodule (labeled #6) is unchanged to decreased in size compared to the 03/2019 examination. Correlation with previous biopsy results is advised. Assuming a benign pathologic diagnosis, repeat sampling and/or continued dedicated follow-up is not recommended. 4. Additional left-sided thyroid  nodule (labeled #7), previously meeting criteria to recommend surveillance, now appears anechoic and cystic and as such has been down graded from a TR4 nodule to a TR1 nodule and no longer meets imaging  criteria to recommend continued dedicated follow-up.  FNA (08/03/2020): Clinical History: Left; Superior - this nodule was not definitely seen  on the 03/2019 examination, 1.6 cm;  Other 2 dimensions: 1.2 x 0.8cm  solid/almost completely solid hypoechoic TI-RADS - 4  Specimen Submitted:  A. THYROID , LUP, FINE NEEDLE ASPIRATION:   FINAL MICROSCOPIC DIAGNOSIS:  - Consistent with benign follicular nodule (Bethesda category II)   SPECIMEN ADEQUACY:  Satisfactory for evaluation   Thyroid  U/S (08/22/2021): Parenchymal Echotexture: Mildly heterogenous  Isthmus: 0.5 cm  Right lobe: 5.1 x 1.7 x 1.6 cm  Left lobe: 5.0 x 2.2 x 2.0 cm  _________________________________________________________   Estimated total number of nodules >/= 1 cm: 4 _________________________________________________________   Nodule # 5: The previously biopsied nodule in the left superior gland remains stable to slightly smaller at 1.4 x 0.8 x 0.8 cm compared to 1.6 x 1.2 x 0.8 cm previously.   Nodule # 6: Previously biopsied nodule in the left mid to upper gland is similar in size at 2.1 x 1.8 x 1.3 cm compared to 1.9 x 1.9 x 1.1 cm previously.   Numerous additional small cysts and nodules scattered throughout the thyroid  gland. None of these meet criteria to warrant further evaluation or biopsy.   IMPRESSION: 1. Slow involution of previously biopsied nodule in the left superior gland. Involution over time is consistent with benignity. 2. Continued stability of previously biopsied nodule in the left mid gland. Recommend correlation with prior biopsy results. 3. Numerous additional small cysts and nodules bilaterally, none of which meet criteria to warrant further evaluation. 4. No new or suspicious  nodules.   R lobe:   Sup. L lobe:   Thyroid  U/S (12/02/2022): Parenchymal Echotexture: Moderately heterogenous  Isthmus: 0.5 cm  Right lobe: 5.0 cm x 2.0 cm x 1.8 cm  Left lobe: 5.3 cm x 1.9 cm x 2.2 cm   _________________________________________________________   Estimated total number of nodules >/= 1 cm: 4  _________________________________________________________   Nodule 1 right superior thyroid , 9 mm. Nodule does not meet criteria for surveillance.   Nodule labeled 2 mid right thyroid , 7 mm. Nodule does not meet criteria for surveillance.   Nodule labeled 3, inferior right thyroid , 8 mm. Nodule does not meet criteria for surveillance.   Nodule labeled 4, inferior right thyroid , favored to represent a pseudo nodule in a region of heterogeneous thyroid  tissue and does not meet specific criteria for surveillance.   Nodule labeled 5, superior left thyroid  1.9 cm, previously 1.4 cm. This nodule has undergone prior biopsy 08/03/2020. Assuming benign result no further specific follow-up would be indicated.   Nodule labeled 6, left mid thyroid , 2.1 cm, unchanged. Nodule has undergone prior biopsy 03/29/2019 and assuming benign result no further specific follow-up would be indicated.   Nodule labeled 7, inferior left thyroid , 8 mm. This has decreased in size over time and now is below threshold for further surveillance.   Nodule labeled 8, inferior left thyroid , 1.4 cm, increased in size from the prior. Nodule meets criteria for surveillance.   No adenopathy   Recommendations follow those established by the new ACR TI-RADS criteria (J Am Coll Radiol 2017;14:587-595).   IMPRESSION: Multinodular thyroid  as above.   Left inferior thyroid  nodule (labeled 8, 1.4 cm) now meets criteria for surveillance, as designated by the newly established ACR TI-RADS criteria. Surveillance ultrasound study recommended to be performed annually up to 5 years.   Thyroid  U/S (03/12/2024): Parenchymal Echotexture: Mildly heterogenous  Isthmus: 0.4 cm  Right lobe: 5.1 x 1.6 x 1.5 cm  Left lobe: 5.6 x 1.9 x 2.2 cm  _________________________________________________________   Estimated total  number of nodules >/= 1 cm: 5 _________________________________________________________   Nodules # 1, # 2 and # 3: Subcentimeter nodules in the right mid gland. None meet criteria for biopsy or imaging surveillance.   Nodule # 4: Hypoechoic solid nodule in the right lower gland measures 1.2 x 1.0 x 0.7 cm. This nodule is new/enlarged compared to prior and is consistent with TI-RADS category 4. *Given size (>/= 1 - 1.4 cm) and appearance, a follow-up ultrasound in 1 year should be considered based on TI-RADS criteria.   Nodule # 5: Mixed cystic and solid nodule in the left upper gland is decreasing in size and measures 1.6 x 1.1 x 0.7 cm.   Nodule # 6: Previously biopsied nodule in the left mid gland remains grossly unchanged at 2.2 by 1.1 x 2.2 cm.   Nodule # 7: Small spongiform nodule in the left lower gland measures no more than 0.9 cm. This nodule does NOT meet TI-RADS criteria for biopsy or dedicated follow-up.   Nodule # 8: Hypoechoic nodule in the left lower gland has decreased in size now measuring 1.0 x 0.7 x 0.5 cm compared to 1.5 by 0.5 x 1.0 cm previously. Involution over time is consistent with benignity. No further follow-up.   IMPRESSION: 1. No significant interval change in the size or appearance of previously biopsied nodules in the left mid and left upper gland. 2. The previously identified nodule in the left lower gland (labeled # 8) has decreased in  size. Involution is consistent with benignity. No further follow-up is recommended. 3. However, there is a new/enlarging nodule in the right lower gland labeled # 4 on the present study. This 1.2 cm TI-RADS category 4 nodule now meets criteria for imaging surveillance. Recommend attention on follow-up imaging in 1 year.  Pt denies: - feeling nodules in neck - hoarseness - dysphagia She has choking with meat occasionally.  History of hyperthyroidism,:  Latest TFTs have been normal: Lab Results  Component  Value Date   TSH 0.67 09/05/2023   TSH 1.08 11/05/2022   TSH 0.678 11/14/2021   TSH 1.15 07/11/2020   TSH 0.846 01/20/2019   TSH 0.709 05/06/2018   TSH 0.46 01/04/2016   FREET4 1.32 11/14/2021   FREET4 0.84 07/11/2020   FREET4 1.26 01/20/2019   FREET4 1.17 05/06/2018   + FH of thyroid  ds. In son; no thyroid  cancer. No h/o radiation tx to head or neck. No herbal supplements. No Biotin use. No recent steroids use.   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   Panic attacks    Hx of   Thickened endometrium    Thyroid  nodule 03/03/2019   Past Surgical History:  Procedure Laterality Date   CHOLECYSTECTOMY     CYSTOSCOPY  11/04/2019   Procedure: CYSTOSCOPY;  Surgeon: Barbette Knock, MD;  Location: [facility];  Service: Gynecology;;   DILATION AND CURETTAGE OF UTERUS N/A 06/16/2018   Procedure: DILATATION AND CURETTAGE;  Surgeon: Starla Harland BROCKS, MD;  Location: [facility];  Service: Gynecology;  Laterality: N/A;   ROBOTIC ASSISTED TOTAL HYSTERECTOMY WITH BILATERAL SALPINGO OOPHERECTOMY Bilateral 11/04/2019   Procedure: XI ROBOTIC ASSISTED TOTAL HYSTERECTOMY WITH BILATERAL SALPINGO OOPHORECTOMY/Pelvic Washings;  Surgeon: Barbette Knock, MD;  Location[facility];  Service: Gynecology;  Laterality: Bilateral;  Tracie to RNFA confirmed on 10/15/19 CS   TOE SURGERY     TUBAL LIGATION     US   ECHOCARDIOGRAPHY  01-31-2009   EF 60%   Social History   Socioeconomic History   Marital status: Divorced    Spouse name: Not on file   Number of children: 2   Years of education: 12   Highest education level: 12th grade  Occupational History   Occupation: Social Security Disability  Tobacco Use   Smoking status: Every Day    Current packs/day: 0.50    Average packs/day: 0.5 packs/day for 46.1 years (23.0 ttl pk-yrs)    Types: Cigarettes    Start date: 1980   Smokeless tobacco: Never  Vaping Use   Vaping status: Never Used  Substance and Sexual Activity   Alcohol use: No    Alcohol/week: 0.0 standard drinks of alcohol   Drug use: No   Sexual activity: Not Currently    Birth control/protection: Post-menopausal  Other Topics Concern   Not on file  Social History Narrative   Lives with on of her 3 sons, no grandchildren   Caffeine use: coffee and coke daily   Right-handed   Retired   Exercise - walks, mows, chops her wood   11/2021   Social Drivers of Health   Tobacco Use: High Risk (03/09/2024)   Patient History    Smoking Tobacco Use: Every Day    Smokeless Tobacco Use: Never    Passive Exposure: Not on file  Financial Resource Strain: Low Risk (01/20/2024)   Overall Financial Resource Strain (CARDIA)    Difficulty of Paying Living Expenses: Not hard at all  Food Insecurity: No Food Insecurity (01/20/2024)   Hunger Vital Sign    Worried About Running Out of Food in the Last Year: Never true    Ran Out of Food in the Last Year: Never true  Transportation Needs: No Transportation Needs (01/20/2024)   PRAPARE - Administrator, Civil Service (Medical): No    Lack of Transportation (Non-Medical): No  Physical Activity: Sufficiently Active (01/20/2024)   Exercise Vital Sign    Days of Exercise per Week: 7 days    Minutes of Exercise per Session: 40 min  Stress: Stress Concern Present (01/20/2024)   [employer] of Occupational Health - Occupational Stress  Questionnaire    Feeling of Stress : Very much  Social Connections: Unknown (01/20/2024)   Social Connection and Isolation Panel    Frequency of Communication with Friends and Family: More than three times a week    Frequency of Social Gatherings with Friends and Family: More than three times a week    Attends Religious Services: More than 4 times per year    Active Member of [employer] or Organizations: Yes    Attends Banker Meetings: More than 4 times per year    Marital Status: Patient declined  Intimate Partner Violence: Not At Risk (01/20/2024)   Humiliation, Afraid, Rape, and Kick questionnaire    Fear of Current or Ex-Partner: No    Emotionally Abused: No    Physically Abused: No    Sexually Abused: No  Depression (PHQ2-9): High Risk (01/20/2024)   Depression (PHQ2-9)    PHQ-2 Score: 15  Alcohol Screen: Not on file  Housing: Low Risk (01/20/2024)   Housing Stability Vital Sign    Unable to Pay for Housing in the Last Year: No    Number of Times Moved in the Last Year: 0    Homeless in the Last Year: No  Utilities: Not At Risk (01/20/2024)   [facility] Utilities    Threatened with loss of utilities: No  Health Literacy: Not on file   Current Outpatient Medications on File Prior to Visit  Medication Sig Dispense Refill   ACCU-CHEK GUIDE TEST test strip USE AS INSTRUCTED TO CHECK BLOOD SUGAR ONCE DAILY 100 strip 3   Accu-Chek Softclix Lancets lancets USE AS INSTRUCTED TO CHECK BLOOD SUGAR ONCE DAILY 100 each 3   atorvastatin  (LIPITOR) 10 MG tablet TAKE 1 TABLET EVERY DAY 90 tablet 1   Blood Glucose Monitoring Suppl (ACCU-CHEK GUIDE) w/Device KIT Use as instructed to check blood sugar 1X daily 1 kit 0   Continuous Glucose Sensor (FREESTYLE LIBRE 3 PLUS SENSOR) MISC 1 each by Does not apply route every 14 (fourteen) days. 6 each 3   cyanocobalamin  (VITAMIN B12) 1000 MCG tablet TAKE 1 TABLET EVERY DAY 90 tablet 1   diphenhydrAMINE-APAP, sleep, (ACETAMINOPHEN  PM PO) Take by mouth.  JARDIANCE  10 MG TABS tablet TAKE 1 TABLET EVERY DAY BEFORE BREAKFAST 90 tablet 3   meclizine  (ANTIVERT ) 25 MG tablet Take 1 tablet (25 mg total) by mouth 2 (two) times daily. 30 tablet 0   rivaroxaban  (XARELTO ) 10 MG TABS tablet Take 1 tablet (10 mg total) by mouth daily. 90 tablet 3   No current facility-administered medications on file prior to visit.   No Known Allergies Family History  Problem Relation Age of Onset   Diabetes Mother    Leukemia Father    Leukemia Sister    Diabetes Sister    Leukemia Brother    Multiple sclerosis Neg Hx    Autoimmune disease Neg Hx    Thyroid  disease Neg Hx    PE: BP 120/70   Pulse 87   SpO2 97%  she declined being weighed. Wt Readings from Last 3 Encounters:  03/09/24 200 lb 9.6 oz (91 kg)  01/20/24 188 lb (85.3 kg)  12/02/23 188 lb (85.3 kg)   Constitutional: overweight, in NAD Eyes: no exophthalmos ENT: no thyromegaly, no cervical lymphadenopathy Cardiovascular: RRR, No MRG Respiratory: CTA B Musculoskeletal: no deformities Skin: no rashes Neurological: + tremor with outstretched hands  ASSESSMENT: 1. DM2, non-insulin-dependent, controlled, without long-term complications  2.  Multiple thyroid  nodules  3.  History of hypothyroidism  PLAN:  1. Patient with longstanding, uncontrolled, type 2 diabetes, on oral antidiabetic regimen with low-dose Jardiance  only, after metformin  was stopped due to severe diarrhea, which prevented her from leaving the house.  HbA1c at last check was slightly higher, at 6.7%, but still at goal.  She was not checking blood sugars at last visit and I strongly advised her to start.  We did not change her regimen at that time.  We discussed about trying a CGM and I sent a prescription for the freestyle libre CGM to her pharmacy. - At today's visit, she is not checking her blood sugars.  She was not able to obtain the CGM (she did not receive it in the mail from the pharmacy).  She called our office but we  discussed that we are not sure why she was not sent the sensor.  I am trying to send it again today to see if it is covered in the new year.  We discussed that if this is not covered, she does need to check her sugars by fingerstick and I sent supplies to her mail order pharmacy as she does not have the strips and lancets anymore.  I strongly advised her to restart checking her blood sugars. - I suggested to:  Patient Instructions  Please continue: - Jardiance  10 mg daily in am   Try to start the [place] sensor.  Please return in 6 months.   - we checked her HbA1c: 6.6% (slightly lower) - advised to check sugars at different times of the day - 4x a day, rotating check times - advised for yearly eye exams >> she is UTD - will check her annual labs today - I advised her to reestablish care with PCP - return to clinic in 6 months  2.  Multiple thyroid  nodules - Reviewed previous ultrasound reports including the study from 11/2022.  She has a left inferior 1.4 cm nodule that increased in size and is a qualified for yearly follow-up, otherwise, nodules were stable or decreased in size.  Of note, the dominant nodules in the left upper lobe were both biopsied with benign results in the past - He denies neck  compression symptoms except occasional choking with dry foods, which is not new - Latest thyroid  ultrasound results were reviewed from 03/12/2024: Most nodules were stable and 1 nodule decreased in size.  The right lower to 1.2 cm nodule qualified for 1 year follow-up  3.  History of hyperthyroidism - resolved - Latest TFTs were normal at last check: Lab Results  Component Value Date   TSH 0.67 09/05/2023  - will recheck her TSH today - No signs or symptoms of thyrotoxicosis - Will continue to follow her expectantly  Orders Placed This Encounter  Procedures   TSH   Microalbumin / creatinine urine ratio   Lipid Panel w/reflex Direct LDL   Comprehensive metabolic panel with GFR   POCT  glycosylated hemoglobin (Hb A1C)   Lela Fendt, MD PhD [facility]

## 2024-10-07 ENCOUNTER — Other Ambulatory Visit

## 2025-01-27 ENCOUNTER — Ambulatory Visit: Payer: Self-pay | Admitting: Medical

## 2025-04-04 ENCOUNTER — Ambulatory Visit: Admitting: Internal Medicine
